# Patient Record
Sex: Female | Born: 1945 | Race: White | Hispanic: No | Marital: Married | Smoking: Former smoker
Health system: Southern US, Community
[De-identification: ages and names within clinical notes are randomized; demographics above are authoritative.]

## PROBLEM LIST (undated history)

## (undated) DIAGNOSIS — I878 Other specified disorders of veins: Secondary | ICD-10-CM

## (undated) DIAGNOSIS — R079 Chest pain, unspecified: Secondary | ICD-10-CM

## (undated) DIAGNOSIS — F17200 Nicotine dependence, unspecified, uncomplicated: Secondary | ICD-10-CM

## (undated) DIAGNOSIS — E119 Type 2 diabetes mellitus without complications: Secondary | ICD-10-CM

## (undated) DIAGNOSIS — I10 Essential (primary) hypertension: Secondary | ICD-10-CM

## (undated) DIAGNOSIS — IMO0002 Reserved for concepts with insufficient information to code with codable children: Secondary | ICD-10-CM

## (undated) DIAGNOSIS — I4892 Unspecified atrial flutter: Secondary | ICD-10-CM

## (undated) DIAGNOSIS — K219 Gastro-esophageal reflux disease without esophagitis: Secondary | ICD-10-CM

## (undated) DIAGNOSIS — G4733 Obstructive sleep apnea (adult) (pediatric): Secondary | ICD-10-CM

## (undated) DIAGNOSIS — I4891 Unspecified atrial fibrillation: Secondary | ICD-10-CM

## (undated) DIAGNOSIS — E785 Hyperlipidemia, unspecified: Secondary | ICD-10-CM

## (undated) DIAGNOSIS — I313 Pericardial effusion (noninflammatory): Secondary | ICD-10-CM

## (undated) DIAGNOSIS — E039 Hypothyroidism, unspecified: Secondary | ICD-10-CM

## (undated) DIAGNOSIS — I5030 Unspecified diastolic (congestive) heart failure: Secondary | ICD-10-CM

## (undated) DIAGNOSIS — I3139 Other pericardial effusion (noninflammatory): Secondary | ICD-10-CM

## (undated) DIAGNOSIS — E669 Obesity, unspecified: Secondary | ICD-10-CM

## (undated) DIAGNOSIS — J189 Pneumonia, unspecified organism: Secondary | ICD-10-CM

## (undated) DIAGNOSIS — D649 Anemia, unspecified: Secondary | ICD-10-CM

## (undated) HISTORY — PX: APPENDECTOMY: SHX54

## (undated) HISTORY — DX: Nicotine dependence, unspecified, uncomplicated: F17.200

## (undated) HISTORY — DX: Hyperlipidemia, unspecified: E78.5

## (undated) HISTORY — DX: Obstructive sleep apnea (adult) (pediatric): G47.33

## (undated) HISTORY — DX: Other specified disorders of veins: I87.8

## (undated) HISTORY — DX: Type 2 diabetes mellitus without complications: E11.9

## (undated) HISTORY — DX: Essential (primary) hypertension: I10

## (undated) HISTORY — DX: Obesity, unspecified: E66.9

---

## 1979-02-24 HISTORY — PX: BREAST BIOPSY: SHX20

## 1979-02-24 HISTORY — PX: CHOLECYSTECTOMY: SHX55

## 2000-11-05 ENCOUNTER — Ambulatory Visit (HOSPITAL_COMMUNITY): Admission: RE | Admit: 2000-11-05 | Discharge: 2000-11-05 | Payer: Self-pay | Admitting: Family Medicine

## 2000-11-05 ENCOUNTER — Encounter: Payer: Self-pay | Admitting: Family Medicine

## 2000-11-11 ENCOUNTER — Encounter: Admission: RE | Admit: 2000-11-11 | Discharge: 2000-11-11 | Payer: Self-pay | Admitting: Family Medicine

## 2000-11-11 ENCOUNTER — Encounter: Payer: Self-pay | Admitting: Family Medicine

## 2002-03-17 ENCOUNTER — Encounter: Admission: RE | Admit: 2002-03-17 | Discharge: 2002-03-17 | Payer: Self-pay | Admitting: Family Medicine

## 2002-03-17 ENCOUNTER — Encounter: Payer: Self-pay | Admitting: Family Medicine

## 2003-04-13 ENCOUNTER — Encounter: Payer: Self-pay | Admitting: Family Medicine

## 2003-04-13 ENCOUNTER — Ambulatory Visit (HOSPITAL_COMMUNITY): Admission: RE | Admit: 2003-04-13 | Discharge: 2003-04-13 | Payer: Self-pay | Admitting: Family Medicine

## 2004-06-28 ENCOUNTER — Emergency Department (HOSPITAL_COMMUNITY): Admission: EM | Admit: 2004-06-28 | Discharge: 2004-06-28 | Payer: Self-pay | Admitting: Emergency Medicine

## 2005-04-05 ENCOUNTER — Encounter (INDEPENDENT_AMBULATORY_CARE_PROVIDER_SITE_OTHER): Payer: Self-pay | Admitting: Specialist

## 2005-04-05 ENCOUNTER — Inpatient Hospital Stay (HOSPITAL_COMMUNITY): Admission: EM | Admit: 2005-04-05 | Discharge: 2005-04-06 | Payer: Self-pay | Admitting: Emergency Medicine

## 2005-06-07 ENCOUNTER — Other Ambulatory Visit: Admission: RE | Admit: 2005-06-07 | Discharge: 2005-06-07 | Payer: Self-pay | Admitting: Family Medicine

## 2005-07-02 ENCOUNTER — Encounter: Admission: RE | Admit: 2005-07-02 | Discharge: 2005-07-02 | Payer: Self-pay | Admitting: Family Medicine

## 2005-07-10 ENCOUNTER — Encounter (INDEPENDENT_AMBULATORY_CARE_PROVIDER_SITE_OTHER): Payer: Self-pay | Admitting: Cardiology

## 2005-07-10 ENCOUNTER — Ambulatory Visit (HOSPITAL_COMMUNITY): Admission: RE | Admit: 2005-07-10 | Discharge: 2005-07-10 | Payer: Self-pay | Admitting: Family Medicine

## 2005-07-11 ENCOUNTER — Encounter: Admission: RE | Admit: 2005-07-11 | Discharge: 2005-07-11 | Payer: Self-pay | Admitting: Family Medicine

## 2005-08-03 ENCOUNTER — Ambulatory Visit (HOSPITAL_COMMUNITY): Admission: RE | Admit: 2005-08-03 | Discharge: 2005-08-03 | Payer: Self-pay | Admitting: Gastroenterology

## 2005-08-03 ENCOUNTER — Encounter (INDEPENDENT_AMBULATORY_CARE_PROVIDER_SITE_OTHER): Payer: Self-pay | Admitting: Specialist

## 2005-08-03 LAB — HM COLONOSCOPY

## 2006-03-25 ENCOUNTER — Ambulatory Visit: Payer: Self-pay | Admitting: Family Medicine

## 2006-03-25 ENCOUNTER — Encounter: Admission: RE | Admit: 2006-03-25 | Discharge: 2006-03-25 | Payer: Self-pay | Admitting: Family Medicine

## 2007-03-03 ENCOUNTER — Ambulatory Visit: Payer: Self-pay | Admitting: Family Medicine

## 2008-01-19 ENCOUNTER — Ambulatory Visit: Payer: Self-pay | Admitting: Family Medicine

## 2008-01-26 ENCOUNTER — Ambulatory Visit: Payer: Self-pay | Admitting: Family Medicine

## 2008-01-26 ENCOUNTER — Encounter: Admission: RE | Admit: 2008-01-26 | Discharge: 2008-01-26 | Payer: Self-pay | Admitting: Family Medicine

## 2008-04-30 ENCOUNTER — Ambulatory Visit: Payer: Self-pay | Admitting: Family Medicine

## 2008-05-06 ENCOUNTER — Ambulatory Visit: Payer: Self-pay | Admitting: Family Medicine

## 2008-05-07 ENCOUNTER — Ambulatory Visit: Payer: Self-pay | Admitting: Vascular Surgery

## 2008-05-07 ENCOUNTER — Ambulatory Visit: Admission: RE | Admit: 2008-05-07 | Discharge: 2008-05-07 | Payer: Self-pay | Admitting: Family Medicine

## 2008-05-07 ENCOUNTER — Encounter: Payer: Self-pay | Admitting: Family Medicine

## 2008-05-13 ENCOUNTER — Ambulatory Visit: Payer: Self-pay | Admitting: Family Medicine

## 2008-05-24 ENCOUNTER — Ambulatory Visit (HOSPITAL_COMMUNITY): Admission: RE | Admit: 2008-05-24 | Discharge: 2008-05-24 | Payer: Self-pay | Admitting: Family Medicine

## 2008-05-26 ENCOUNTER — Ambulatory Visit: Payer: Self-pay | Admitting: Family Medicine

## 2009-01-27 ENCOUNTER — Ambulatory Visit: Payer: Self-pay | Admitting: Family Medicine

## 2009-01-31 ENCOUNTER — Encounter: Admission: RE | Admit: 2009-01-31 | Discharge: 2009-01-31 | Payer: Self-pay | Admitting: Family Medicine

## 2009-04-04 ENCOUNTER — Ambulatory Visit: Payer: Self-pay | Admitting: Family Medicine

## 2010-02-15 ENCOUNTER — Ambulatory Visit: Payer: Self-pay | Admitting: Physician Assistant

## 2010-05-31 ENCOUNTER — Ambulatory Visit: Payer: Self-pay | Admitting: Family Medicine

## 2010-07-16 ENCOUNTER — Encounter: Payer: Self-pay | Admitting: Family Medicine

## 2010-10-25 ENCOUNTER — Encounter: Payer: Self-pay | Admitting: Family Medicine

## 2010-11-10 NOTE — Op Note (Signed)
NAME:  Nancy West, LASHLEY              ACCOUNT NO.:  [id number]   MEDICAL RECORD NO.:  [id number]          PATIENT TYPE:  AMB   LOCATION:  ENDO                         FACILITY:  [facility]   PHYSICIAN:  Anselmo Rod, M.D.  DATE OF BIRTH:  Nov 13, 1945   DATE OF PROCEDURE:  08/03/2005  DATE OF DISCHARGE:                                 OPERATIVE REPORT   PROCEDURE PERFORMED:  Colonoscopy with cold biopsies x 8 and snare  polypectomy x 1.   ENDOSCOPIST:  Charna Elizabeth, M.D.   INSTRUMENT USED:  Olympus video colonoscope.   INDICATIONS FOR PROCEDURE:  The patient is a 65 year old white female  undergoing screening colonoscopy.  The patient has a history of guaiac  positive stool and had noted blood occasionally, which she attributes to her  hemorrhoids.   PREPROCEDURE PREPARATION:  Informed consent was procured from the patient.  The patient was fasted for four hours prior to the procedure and prepped  OsmoPrep pills the night of and the morning of the procedure.  The risks and  benefits of the procedure including a 10% miss rate for polyps or cancers  was discussed with the patient as well.   PREPROCEDURE PHYSICAL:  The patient had stable vital signs.  Neck supple.  Chest clear to auscultation.  S1 and S2 regular.  Abdomen soft with normal  bowel sounds.   DESCRIPTION OF PROCEDURE:  The patient was placed in left lateral decubitus  position and sedated with 90 mcg of fentanyl and 7.5 mg of Versed in slow  incremental doses.  Once the patient was adequately sedated and maintained  on low flow oxygen and continuous cardiac monitoring, the Olympus video  colonoscope was advanced from the rectum to the cecum.  The appendicular  orifice and ileocecal valve were clearly visualized and photographed.  The  terminal ileum appeared normal and without lesions.  Small external  hemorrhoids and small internal hemorrhoids were seen on retroflexion.  A  small sessile polyp was removed by cold snare  from 6-0 cm.  Several left  colonic polyps were removed by cold biopsy as well.  They were all placed in  the same bottle.  There was no evidence of diverticulosis.  The transverse  colon, right colon, cecum and terminal ileum appeared normal.  The patient  tolerated the procedure well without complication.   IMPRESSION:  1.  Small external hemorrhoid.  2.  Small internal hemorrhoids.  3.  Multiple small sessile polyps removed by cold biopsies and one by cold      snare from the left colon.  4.  No evidence of diverticulosis.  5.  Normal-appearing transverse colon, right colon, cecum and terminal      ileum.   RECOMMENDATIONS:  1.  Await pathology results.  2.  Avoid all nonsteroidals including aspirin for the next two weeks.  3.  Repeat colonoscopy depending on pathology results.  4.  Outpatient followup as need arises in the future.      Anselmo Rod, M.D.  Electronically Signed     JNM/MEDQ  D:  08/03/2005  T:  08/03/2005  Job:  [id number]   cc:   Sharlot Gowda, M.D.  Fax: [phone]

## 2010-11-10 NOTE — Discharge Summary (Signed)
NAME:  Nancy West, Nancy West NO.:  [id number]   MEDICAL RECORD NO.:  [id number]          PATIENT TYPE:  INP   LOCATION:  5740                         FACILITY:  [facility]   PHYSICIAN:  Lebron Conners, M.D.   DATE OF BIRTH:  April 18, 1946   DATE OF ADMISSION:  04/05/2005  DATE OF DISCHARGE:  04/06/2005                                 DISCHARGE SUMMARY   DISCHARGE DIAGNOSES:  1.  Acute appendicitis, status post laparoscopic appendectomy on April 05, 2005.  2.  Hypertension, treated.  3.  Hyperlipidemia, treated.  4.  Tobacco abuse.  5.  History of open cholecystectomy in the 1980's.   HOSPITAL COURSE:  [name] is a 65 year old female who presented to the  emergency room with approximately 48 hours of abdominal pain. Initially, the  abdominal pain presented in the umbilicus and gradually migrated to the  right lower quadrant. She presented to the emergency room and CT of the  abdomen and pelvis showed an inflamed tubular structure extending from the  pelvis and felt to be representative of an inflamed appendix. The patient  then was taken to the operating room and underwent a laparoscopic  appendectomy under the care of Dr. Lebron Conners. The patient tolerated the  procedure well and was taken back to her room in stable condition.   Overnight, the patient remained stable and the following day, she was felt  to be ready for discharge to home.   CONDITION ON DISCHARGE:  She is discharged to home in stable condition.   DISCHARGE MEDICATIONS:  1.  Vicodin as needed for pain.  2.  She is to resume her Lotensin.  3.  She is to resume her Lipitor daily.   WOUND CARE:  She is to clean her incisions gently with soap and water.   ACTIVITY:  No driving for 2 days. No lifting over 10 pounds for 3 weeks.   DIET:  Advance as tolerated.   SPECIAL INSTRUCTIONS:  She is to call for fever greater than 101.5 or any  redness or drainage of her incision.      Guy Franco,  P.A.      Lebron Conners, M.D.  Electronically Signed    LB/MEDQ  D:  05/25/2005  T:  05/26/2005  Job:  [id number]   cc:   Lebron Conners, M.D.  [address], [address]  [place]  [place] [address]

## 2010-11-10 NOTE — Op Note (Signed)
NAME:  Nancy West, Nancy West NO.:  [id number]   MEDICAL RECORD NO.:  [id number]          PATIENT TYPE:  INP   LOCATION:  1828                         FACILITY:  [facility]   PHYSICIAN:  Lorre Munroe., M.D.DATE OF BIRTH:  09-17-1945   DATE OF PROCEDURE:  04/05/2005  DATE OF DISCHARGE:                                 OPERATIVE REPORT   PREOPERATIVE DIAGNOSIS:  Acute appendicitis.   POSTOPERATIVE DIAGNOSIS:  Acute appendicitis.   OPERATION:  Laparoscopic appendectomy.   SURGEON:  Lebron Conners, M.D.   ASSISTANT:  Raechel Ache, M.D.   ANESTHESIA:  General and local.   PROCEDURE:  After the patient was monitored and given general anesthesia and  had routine preparation and draping of the abdomen, I infiltrated local  anesthetic just above the umbilicus and made a 3 cm vertical incision,  dissected down through the fat, then made a 2 cm incision in the midline  fascia and bluntly entered the peritoneal cavity.  After assuring that there  were no adhesions in the region, I placed a 0 Vicryl pursestring suture in  the fascia and secured a Hasson cannula and inflated the abdomen with CO2.  When I put in the laparoscope and placed the patient head-down tilted a  little bit to the left, I could see an acutely inflamed appendix.  I saw no  other abnormalities.  I anesthetized a spot in the right upper quadrant and  another spot in the lower midline and under direct view put in a 5 mm right  upper quadrant port and an 11 mm lower midline port.  I then broke up  adhesions, holding the appendix down, and was able to grasp it with a large  grasper.  I then dissected the mesentery to reduce the bulk of it a good  deal and when I had a nice view of the appendiceal artery and was thinned  down pretty well, I stapled across the mesentery with a vascular stapler and  that divided it nicely.  I was able to dissect up the base of the appendix  and see that near the cecum the appendix  appeared to have good integrity.  I  stapled across it with a firing of the stapler and almost completely divided  it, but I felt that I should fire one more time so I fired one more load  across the cecum and base of the appendix, and the amputation was good.  Hemostasis was not a problem.  I briefly irrigated the operative area and  removed the irrigant.  I placed the appendix into a plastic pouch and  removed it through the umbilical incision and then after assuring good  hemostasis, I removed the right upper quadrant port under direct view and  then after allowing the CO2 to escape removed the lower midline port.  The  patient was stable throughout the procedure and seemed to tolerate it well.  In case I failed to mention it, the appendix was removed through the  umbilical incision and thereafter the pursestring suture was tied.  All the  skin incisions were closed with  intracuticular 4-0 Vicryl and Steri-Strips.     Lorre Munroe., M.D.  Electronically Signed    WB/MEDQ  D:  04/05/2005  T:  04/05/2005  Job:  [id number]

## 2010-11-17 ENCOUNTER — Encounter: Payer: Self-pay | Admitting: Family Medicine

## 2010-11-17 ENCOUNTER — Ambulatory Visit (INDEPENDENT_AMBULATORY_CARE_PROVIDER_SITE_OTHER): Payer: PRIVATE HEALTH INSURANCE | Admitting: Family Medicine

## 2010-11-17 VITALS — BP 134/68 | HR 68 | Ht 68.0 in | Wt 330.0 lb

## 2010-11-17 DIAGNOSIS — E039 Hypothyroidism, unspecified: Secondary | ICD-10-CM

## 2010-11-17 DIAGNOSIS — I10 Essential (primary) hypertension: Secondary | ICD-10-CM | POA: Insufficient documentation

## 2010-11-17 DIAGNOSIS — E119 Type 2 diabetes mellitus without complications: Secondary | ICD-10-CM

## 2010-11-17 DIAGNOSIS — E559 Vitamin D deficiency, unspecified: Secondary | ICD-10-CM | POA: Insufficient documentation

## 2010-11-17 DIAGNOSIS — R609 Edema, unspecified: Secondary | ICD-10-CM

## 2010-11-17 DIAGNOSIS — E7800 Pure hypercholesterolemia, unspecified: Secondary | ICD-10-CM | POA: Insufficient documentation

## 2010-11-17 DIAGNOSIS — F17200 Nicotine dependence, unspecified, uncomplicated: Secondary | ICD-10-CM

## 2010-11-17 LAB — HEMOGLOBIN A1C
Hgb A1c MFr Bld: 6.4 % — ABNORMAL HIGH (ref <5.7)
Mean Plasma Glucose: 137 mg/dL — ABNORMAL HIGH (ref <117)

## 2010-11-17 LAB — COMPREHENSIVE METABOLIC PANEL
Albumin: 4.1 g/dL (ref 3.5–5.2)
Alkaline Phosphatase: 135 U/L — ABNORMAL HIGH (ref 39–117)
BUN: 27 mg/dL — ABNORMAL HIGH (ref 6–23)
CO2: 29 mEq/L (ref 19–32)
Calcium: 9.5 mg/dL (ref 8.4–10.5)
Glucose, Bld: 108 mg/dL — ABNORMAL HIGH (ref 70–99)
Potassium: 4.2 mEq/L (ref 3.5–5.3)

## 2010-11-17 LAB — LIPID PANEL
Cholesterol: 130 mg/dL (ref 0–200)
Total CHOL/HDL Ratio: 2.8 Ratio

## 2010-11-17 LAB — TSH: TSH: 5.014 u[IU]/mL — ABNORMAL HIGH (ref 0.350–4.500)

## 2010-11-17 MED ORDER — ATORVASTATIN CALCIUM 40 MG PO TABS: 40.0000 mg | ORAL_TABLET | Freq: Every day | ORAL | Status: DC

## 2010-11-17 MED ORDER — FUROSEMIDE 40 MG PO TABS: 40.0000 mg | ORAL_TABLET | Freq: Two times a day (BID) | ORAL | Status: DC

## 2010-11-17 MED ORDER — BENAZEPRIL-HYDROCHLOROTHIAZIDE 20-25 MG PO TABS: 1.0000 | ORAL_TABLET | Freq: Every day | ORAL | Status: DC

## 2010-11-17 MED ORDER — LEVOTHYROXINE SODIUM 50 MCG PO TABS: 50.0000 ug | ORAL_TABLET | Freq: Every day | ORAL | Status: DC

## 2010-11-17 NOTE — Patient Instructions (Signed)
Limit alcohol to one drink a day (or less!) Try again to quit smoking--let me know if you need Chantix again Try and exercise daily Cut back on sodium in your diet Calcium 1500 mg daily (throughout the day, between diet and vitamins) and 1000-2000 IU of Vitamin D daily (unless you hear otherwise from [place] after lab tests are back)   2 Gram Low Sodium Diet A 2 gram sodium diet restricts the amount of sodium in the diet to no more than 2 grams (g) or 2000 milligrams (mg) daily. Limiting the amount of sodium is often used to help lower blood pressure. It is important if you have heart, liver, or kidney problems. Many foods contain sodium for flavor and sometimes as a preservative. When the amount of sodium in a diet needs to be low, it is important to know what to look for when choosing foods and drinks. The following includes some information and guidelines to help make it easier for you to adapt to a low sodium diet. QUICK TIPS  Do not add salt to food.   Avoid convenience items and fast food.   Choose unsalted snack foods.   Buy lower sodium products, often labeled as "lower sodium" or "no salt added."   Check food labels to learn how much sodium is in 1 serving.   When eating at a restaurant, ask that your food be prepared with less salt or none, if possible.  READING FOOD LABELS FOR SODIUM INFORMATION The nutrition facts label is a good place to find how much sodium is in foods. Look for products with no more than 500 to 600 mg of sodium per meal and no more than 150 mg per serving. Remember that 2 g = 2000 mg. The food label may also list foods as:  Sodium-free: Less than 5 mg in a serving.   Very low sodium: 35 mg or less in a serving.   Low-sodium: 140 mg or less in a serving.   Light in sodium: 50% less sodium in a serving. For example, if a food that usually has 300 mg of sodium is changed to become light in sodium, it will have 150 mg of sodium.   Reduced sodium: 25% less  sodium in a serving. For example, if a food that usually has 400 mg of sodium is changed to reduced sodium, it will have 300 mg of sodium.  CHOOSING FOODS AVOID CHOOSE  Grains: Salted crackers and snack items. Some cereals, including instant hot cereals. Bread stuffing and biscuit mixes. Seasoned rice or pasta mixes. Grains: Unsalted snack items. Low-sodium cereals, oats, puffed wheat and rice, shredded wheat. English muffins and bread. Pasta.  Meats: Salted, canned, smoked, spiced, or pickled meats, including fish and poultry. Bacon, ham, sausage, cold cuts, hot dogs, anchovies. Meats: Low-sodium canned tuna and salmon. Fresh or frozen meat, poultry, and fish.  Dairy: Processed cheese and spreads. Cottage cheese. Buttermilk and condensed milk. Regular cheese. Dairy: Milk. Low-sodium cottage cheese. Yogurt. Sour cream. Low-sodium cheese.  Fruits and Vegetables: Regular canned vegetables. Regular canned tomato sauce and paste. Frozen vegetables in sauces. Olives. Rosita Fire. Relishes. Sauerkraut. Fruits and Vegetables: Low-sodium canned vegetables. Low-sodium tomato sauce and paste. Fresh and frozen vegetables. Fresh and frozen fruit.  Condiments: Canned and packaged gravies. Worcestershire sauce. Tartar sauce. Barbecue sauce. Soy sauce. Steak sauce. Ketchup. Onion, garlic, and table salt. Meat flavorings and tenderizers. Condiments: Fresh and dried herbs and spices. Low-sodium varieties of mustard and ketchup. Lemon juice. Tabasco sauce. Horseradish.  SAMPLE 2 GRAM SODIUM MEAL PLAN Meal Foods Sodium (mg)  Breakfast 1 cup low-fat milk 2 slices whole-wheat toast 1 tablespoon heart-healthy margarine 1 hard-boiled egg 1 small orange [phone]  Lunch 1 cup raw carrots  cup hummus 1 cup low-fat milk  cup red grapes 1 whole-wheat pita bread 76 [phone]  Dinner 1 cup whole-wheat pasta 1 cup low-sodium tomato sauce 3 ounces lean ground  beef 1 small side salad (1 cup raw spinach leaves,  cup cucumber,  cup yellow bell pepper) with 1 teaspoon olive oil and 1 teaspoon red wine vinegar 2 73 57 25  Snack 1 container low-fat vanilla yogurt 3 graham cracker squares 107 127  Nutrient Analysis Calories: 2033 Protein: 77 g Carbohydrate: 282 g Fat: 72 g Sodium: 1971 mg Document Released: 06/11/2005 Document Re-Released: 11/29/2009 [facility] Patient Information 2011 [place], [place].

## 2010-11-17 NOTE — Progress Notes (Signed)
Subjective:    Patient ID: Nancy West, female    DOB: [birth year], 65 y.o.   MRN: [id number]  HPI Hypertension follow-up:  Blood pressures are not checked elsewhere.  Denies dizziness, headaches, chest pain.  Denies side effects of medications. Eats high sodium foods (salami) although doesn't add salt to foods  Diabetes follow-up:  Blood sugars are not checked at home.  Denies polydipsia and polyuria.  Last eye exam was 1.5 years ago.  Patient follows a low sugar diet and checks feet regularly without concerns.  Not getting any regular exercise.  Has noticed some tingling of small areas of both of her feet over the last 6 months.  Hypothyroidism:  Takes medication regularly.  Denies any fatigue or significant thyroid symptoms.  Has gained 4 pounds since last visit  Vitamin D deficiency.  Was on Rx replacement and re-check was normal, but hasn't been using any OTC Vitamin D since last check (low normal in December)  Hyperlipidemia follow-up:  Patient is reportedly following a low-fat, low cholesterol diet.  Compliant with medications and denies medication side effects  Past Medical History  Diagnosis Date  . Obesity   . Hypertension   . Hyperlipidemia   . Diabetes mellitus   . Unspecified hypothyroidism   . Venous stasis of lower extremity     Past Surgical History  Procedure Date  . Cholecystectomy 1980  . Appendectomy     History   Social History  . Marital Status: Married    Spouse Name: N/A    Number of Children: 0  . Years of Education: N/A   Occupational History  . Retired (Presenter, broadcasting) [employer]   Social History Main Topics  . Smoking status: Current Everyday Smoker -- 1.0 packs/day    Types: Cigarettes  . Smokeless tobacco: Never Used   Comment: 1 ppd since age 66; quit x 7 months with Chantix  . Alcohol Use: Yes     daily, 2-3 glasses of wine every evening  . Drug Use: No  . Sexually Active: Not Currently   Other Topics Concern  . 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 Cancer Mother     breast cancer (70), colon cancer  . Diabetes Mother   . Hypertension Mother   . Diabetes Father     Current outpatient prescriptions:aspirin 81 MG tablet, Take 81 mg by mouth daily.  , Disp: , Rfl: ;  atorvastatin (LIPITOR) 40 MG tablet, Take 1 tablet (40 mg total) by mouth daily., Disp: 90 tablet, Rfl: 1;  benazepril-hydrochlorthiazide (LOTENSIN HCT) 20-25 MG per tablet, Take 1 tablet by mouth daily., Disp: 90 tablet, Rfl: 1;  furosemide (LASIX) 40 MG tablet, Take 1 tablet (40 mg total) by mouth 2 (two) times daily., Disp: 90 tablet, Rfl: 1 ibuprofen (ADVIL,MOTRIN) 200 MG tablet, Take 200 mg by mouth every 6 (six) hours as needed.  , Disp: , Rfl: ;  levothyroxine (SYNTHROID, LEVOTHROID) 50 MCG tablet, Take 1 tablet (50 mcg total) by mouth daily., Disp: 90 tablet, Rfl: 1;  DISCONTD: atorvastatin (LIPITOR) 40 MG tablet, Take 40 mg by mouth daily.  , Disp: , Rfl: ;  DISCONTD: benazepril-hydrochlorthiazide (LOTENSIN HCT) 20-25 MG per tablet, Take 1 tablet by mouth daily.  , Disp: , Rfl:  DISCONTD: furosemide (LASIX) 40 MG tablet, Take 40 mg by mouth 2 (two) times daily.  ,  Disp: , Rfl: ;  DISCONTD: levothyroxine (SYNTHROID, LEVOTHROID) 50 MCG tablet, Take 50 mcg by mouth daily.  , Disp: , Rfl:   Allergies  Allergen Reactions  . Augmentin (Amoxicillin-Pot Clavulanate)     Abdominal pain    Review of Systems See HPI. Chronic back pain, worse with walking.  Improved since losing weight.  Knee pain/arthritis--uses ibuprofen about 3x/week.  No headaches, chest pain, palpitations.  Mild swelling in legs, "under control" with the Lasix    Objective:   Physical Exam BP 134/68  Pulse 68  Ht 5\' 8"  (1.727 m)  Wt 330 lb (149.687 kg)  BMI 50.18 kg/m2 HEENT:  PERRL, EOMI, conjunctiva clear.  TM's normal, OP clear Neck:  No lymphadenopathy, thyromegaly  or carotid bruit Heart:  Regular rate and rhythm, no murmurs Lungs:  Clear bilaterally Abdomen:  Obese, nontender, no organomegaly or masses Extremities: Venous stasis changes--dark discoloration of both feet; Diminished pulses bilaterally. 1+ edema at ankles Normal monofilament testing Small callous R great toe Psych:  Normal mood, affect, hygiene and grooming      Assessment & Plan:   1. Type II or unspecified type diabetes mellitus without mention of complication, not stated as uncontrolled  Hemoglobin A1c, Urine Microalbumin w/creat. ratio   Diet controlled, has never been on medications  2. Essential hypertension, benign  Comprehensive metabolic panel, benazepril-hydrochlorthiazide (LOTENSIN HCT) 20-25 MG per tablet   suboptimally controlled.  Goal <130/80  3. Pure hypercholesterolemia  Lipid panel, Comprehensive metabolic panel, atorvastatin (LIPITOR) 40 MG tablet  4. Unspecified hypothyroidism  TSH, levothyroxine (SYNTHROID, LEVOTHROID) 50 MCG tablet  5. Unspecified vitamin D deficiency  Vitamin D 25 hydroxy   was corrected with Rx Vitamin D, but not taking any OTC supplementation.  Re-check today  6. Obesity, Class III, BMI 40-49.9 (morbid obesity)     Max weight of 400 pounds in 2009  7. Edema  furosemide (LASIX) 40 MG tablet, Ambulatory referral to Vascular Surgery  8. Tobacco use disorder     Patient with diminished pulses and is at high risk for PAD.  Refer for ABI and arterial u/s of LE's if ABI abnormal (and then consult vascular surgeons if abnormal).  Counseled for at least 20 minutes on proper diet, exercise and importance of quitting smoking.  The fact that she was successful in the past means that if she keeps trying to quit, she can be successful in the future.  She is to let me know when she is ready for additional assistance (ie. Chantix). Instructed to cut back on alcohol to a maximum of one drink daily

## 2010-11-18 LAB — VITAMIN D 25 HYDROXY (VIT D DEFICIENCY, FRACTURES): Vit D, 25-Hydroxy: 33 ng/mL (ref 30–89)

## 2010-11-22 ENCOUNTER — Telehealth: Payer: Self-pay | Admitting: *Deleted

## 2010-11-22 NOTE — Telephone Encounter (Signed)
Pt informed of all labs. She has an appt sch in 3 months already, 02/22/11 @ 8:45, she would like to have TSH checked at that appt, not prior and we can call her after labs are back if there is a thyroid med adjustment. She will also call if her BP readings go over 130/80 consistently for possible med change.

## 2010-11-27 ENCOUNTER — Encounter (INDEPENDENT_AMBULATORY_CARE_PROVIDER_SITE_OTHER): Payer: PRIVATE HEALTH INSURANCE

## 2010-11-27 DIAGNOSIS — L97909 Non-pressure chronic ulcer of unspecified part of unspecified lower leg with unspecified severity: Secondary | ICD-10-CM

## 2011-01-11 ENCOUNTER — Encounter: Payer: Self-pay | Admitting: Family Medicine

## 2011-01-23 ENCOUNTER — Encounter: Payer: Self-pay | Admitting: Family Medicine

## 2011-01-24 ENCOUNTER — Ambulatory Visit (INDEPENDENT_AMBULATORY_CARE_PROVIDER_SITE_OTHER): Payer: PRIVATE HEALTH INSURANCE | Admitting: Family Medicine

## 2011-01-24 ENCOUNTER — Encounter: Payer: Self-pay | Admitting: Family Medicine

## 2011-01-24 VITALS — BP 132/80 | HR 68 | Ht 68.0 in | Wt 325.0 lb

## 2011-01-24 DIAGNOSIS — I10 Essential (primary) hypertension: Secondary | ICD-10-CM

## 2011-01-24 DIAGNOSIS — K219 Gastro-esophageal reflux disease without esophagitis: Secondary | ICD-10-CM

## 2011-01-24 DIAGNOSIS — R9431 Abnormal electrocardiogram [ECG] [EKG]: Secondary | ICD-10-CM

## 2011-01-24 DIAGNOSIS — F17200 Nicotine dependence, unspecified, uncomplicated: Secondary | ICD-10-CM

## 2011-01-24 DIAGNOSIS — R0789 Other chest pain: Secondary | ICD-10-CM

## 2011-01-24 DIAGNOSIS — R072 Precordial pain: Secondary | ICD-10-CM

## 2011-01-24 MED ORDER — DEXLANSOPRAZOLE 60 MG PO CPDR: 60.0000 mg | DELAYED_RELEASE_CAPSULE | Freq: Every day | ORAL | Status: AC

## 2011-01-24 NOTE — Progress Notes (Signed)
Patient presents with concern that she might have pancreatic cancer or about to have a heart attack.  She is complaining of epigastric pain.  She describes a tightness in her abdomen (above umbilicus).  Initially pain seemed to be higher, in the chest/under breasts.   Pain is now lower, into the upper abdomen.  Increased frequency of pain, until it has been more constant over the last 4 months.  Associated with nausea (occasionally), dry heaves (3x), belching constantly.  Initially discomfort was related to meals, especially after fatty or spicy foods, large meals. May have had symptoms for about a year. More recently, discomfort has been constant.  However, since she made appt, symptoms are improved.  Denies any discomfort today.  Since her last visit, she cut out almost all alcohol.  Cut back on smoking--now down to 5 cigarettes/day.  Denies cough (other than slight smoker's cough) no hoarseness, no exertional chest pain.  Denies exertional chest pain.  Some SOB with exertion.  Previously saw Dr. Reyes Ivan for cardiac evaluation (2-3 years ago). Last visit was 2010, and was doing well. She had an echocardiogram which showed mild diastolic dysfunction, otherwise normal (in 2009).  Past Medical History  Diagnosis Date  . Obesity   . Hypertension   . Hyperlipidemia   . Diabetes mellitus   . Unspecified hypothyroidism   . Venous stasis of lower extremity     Past Surgical History  Procedure Date  . Cholecystectomy 1980  . Appendectomy     History   Social History  . Marital Status: Married    Spouse Name: N/A    Number of Children: 0  . Years of Education: N/A   Occupational History  . Retired (Presenter, broadcasting) [employer]   Social History Main Topics  . Smoking status: Current Everyday Smoker -- 1.0 packs/day    Types: Cigarettes  . Smokeless tobacco: Never Used   Comment: 1 ppd since age 57; quit x 7 months with Chantix  . Alcohol Use: Yes     daily, 2-3 glasses of wine every  evening  . Drug Use: No  . Sexually Active: Not Currently   Other Topics Concern  . 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 Cancer Mother     breast cancer (70), colon cancer  . Diabetes Mother   . Hypertension Mother   . Diabetes Father    Current Outpatient Prescriptions on File Prior to Visit  Medication Sig Dispense Refill  . aspirin 81 MG tablet Take 81 mg by mouth daily.        Marland Kitchen atorvastatin (LIPITOR) 40 MG tablet Take 1 tablet (40 mg total) by mouth daily.  90 tablet  1  . benazepril-hydrochlorthiazide (LOTENSIN HCT) 20-25 MG per tablet Take 1 tablet by mouth daily.  90 tablet  1  . ibuprofen (ADVIL,MOTRIN) 200 MG tablet Take 200 mg by mouth every 6 (six) hours as needed.        Marland Kitchen levothyroxine (SYNTHROID, LEVOTHROID) 50 MCG tablet Take 1 tablet (50 mcg total) by mouth daily.  90 tablet  1  . DISCONTD: furosemide (LASIX) 40 MG tablet Take 1 tablet (40 mg total) by mouth 2 (two) times daily.  90 tablet  1    Allergies  Allergen Reactions  . Augmentin (Amoxicillin-Pot Clavulanate)     Abdominal pain   ROS:  See HPI.  Denies fevers, URI symptoms.  +GI complaints.  No change in LE swelling; denies LE pain.  Reports being told that ABI was normal by tech who performed study at vascular office. Denies skin concerns  PHYSICAL EXAM: Well developed, obese, pleasant female, mildly anxious, in no distress BP 132/80  Pulse 68  Ht 5\' 8"  (1.727 m)  Wt 325 lb (147.419 kg)  BMI 49.42 kg/m2 Neck: no lymphadenopathy or thyromegaly Heart: regular rate and rhythm, no significant murmur Chest wall nontender Abdomen: obese.  Soft, nontender.  No hepatosplenomegaly or mass.  No epigastric tenderness Extremities: significant venous stasis changes bilaterally.  Skin intact  EKG: NSR with PVC, rate 78.  Some LAE.  TW inversions noted V3-V5 which appear to be new compared to EKG from  2009  ASSESSMENT/PLAN: 1. GERD (gastroesophageal reflux disease)  dexlansoprazole (DEXILANT) 60 MG capsule  2. Chest pain, mid sternal  PR ELECTROCARDIOGRAM, COMPLETE, Ambulatory referral to Cardiology  3. Abnormal EKG    4. Essential hypertension, benign    5. Tobacco use disorder     EKG was performed due to pts concern of possible heart disease, some atypical symptoms, and significant risk factors for heart disease (smoking, DM, HTN, hyperlipidemia, obesity).  EKG shows evidence of possible ischemia, with changes compared to previous EKG in 2009--referring back to [facility] for further evaluation.  Some of her symptoms are likely related to reflux.  Reflux precautions reviewed extensively.  Encouraged to quit smoking.  Continue to avoid alcohol.  Avoid spicy, acidic foods.  Eat smaller meals (more frequently).  Wait at least 2-3 hours after eating before reclining.  Keep head of bed elevated.    Dexilant 60mg  #25 given, to take one daily.  F/u as previously scheduled for later this month

## 2011-01-24 NOTE — Patient Instructions (Signed)
Acid Reflux (GERD) Acid reflux is also called gastroesophageal reflux disease (GERD). Your stomach makes acid to help digest food. Acid reflux happens when acid from your stomach goes into the tube between your mouth and stomach (esophagus). Your stomach is protected from the acid, but this tube is not. When acid gets into the tube, it may cause a burning feeling in the chest (heartburn). Besides heartburn, other health problems can happen if the acid keeps going into the tube. Some causes of acid reflux include:  Being overweight.   Smoking.   Drinking alcohol.   Eating large meals.   Eating meals and then going to bed right away.   Eating certain foods.   Increased stomach acid production.  HOME CARE  Take all medicine as told by your doctor.   You may need to:   Lose weight.   Avoid alcohol.   Quit smoking.   Do not eat big meals. It is better to eat smaller meals throughout the day.    Diet for GERD or PUD Nutrition therapy can help ease the discomfort of gastroesophageal reflux disease (GERD) and peptic ulcer disease (PUD).  HOME CARE INSTRUCTIONS  Eat your meals slowly, in a relaxed setting.   Eat 5 to 6 small meals per day.   If a food causes distress, stop eating it for a period of time.  FOODS TO AVOID:  Coffee, regular or decaffeinated.  Cola beverages, regular or low calorie.   Tea, regular or decaffeinated.   Pepper.   Cocoa.   High fat foods including meats.   Butter, margarine, hydrogenated oil (trans fats).  Peppermint or spearmint (if you have GERD).   Fruits and vegetables as tolerated.   Alcoholic beverages.   Nicotine (smoking or chewing). This is one of the most potent stimulants to acid production in the gastrointestinal tract.   Any food that seems to aggravate your condition.   If you have questions regarding your diet, call your caregiver's office or a registered dietitian. OTHER TIPS IF YOU HAVE GERD:  Lying flat may make  symptoms worse. Keep the head of your bed raised 6 to 9 inches by using a foam wedge or blocks under the legs of the bed.   Do not lay down until 3 hours after eating a meal.   Daily physical activity may help reduce symptoms.  MAKE SURE YOU:   Understand these instructions.   Will watch your condition.   Will get help right away if you are not doing well or get worse.  Document Released: 06/11/2005 Document Re-Released: 10/28/2008 [facility] Patient Information 2011 [place], [place].  Do not eat a meal and then nap or go to bed.   Sleep with your head higher than your stomach.   Avoid foods that bother you.   You may need more tests, or you may need to see a special doctor.  GET HELP RIGHT AWAY IF:  You have chest pain that is different than before.   You have pain that goes to your arms, jaw, or between your shoulder blades.   You throw up (vomit) blood, dark brown liquid, or your throw up looks like coffee grounds.   You have trouble swallowing.   You have trouble breathing or cannot stop coughing.   You feel dizzy or pass out.   Your skin is cool, wet, and pale.   Your medicine is not helping.  MAKE SURE YOU:   Understand these instructions.   Will watch your condition.  Will get help right away if you are not doing well or get worse.

## 2011-02-13 ENCOUNTER — Other Ambulatory Visit: Payer: Self-pay | Admitting: Family Medicine

## 2011-02-14 ENCOUNTER — Encounter: Payer: Self-pay | Admitting: *Deleted

## 2011-02-21 ENCOUNTER — Ambulatory Visit (INDEPENDENT_AMBULATORY_CARE_PROVIDER_SITE_OTHER): Payer: PRIVATE HEALTH INSURANCE | Admitting: Cardiovascular Disease

## 2011-02-21 ENCOUNTER — Encounter: Payer: Self-pay | Admitting: Cardiovascular Disease

## 2011-02-21 DIAGNOSIS — R9431 Abnormal electrocardiogram [ECG] [EKG]: Secondary | ICD-10-CM

## 2011-02-21 DIAGNOSIS — R079 Chest pain, unspecified: Secondary | ICD-10-CM

## 2011-02-21 DIAGNOSIS — I509 Heart failure, unspecified: Secondary | ICD-10-CM

## 2011-02-21 DIAGNOSIS — I10 Essential (primary) hypertension: Secondary | ICD-10-CM

## 2011-02-21 DIAGNOSIS — R0602 Shortness of breath: Secondary | ICD-10-CM

## 2011-02-21 DIAGNOSIS — I5032 Chronic diastolic (congestive) heart failure: Secondary | ICD-10-CM | POA: Insufficient documentation

## 2011-02-21 NOTE — Assessment & Plan Note (Signed)
Her blood pressure remains mildly elevated. She states that it typically is that her weight when she measures it at home. She has white coat hypertension episodes typically higher when she does see a doctor.  I've asked her to continue with a good weight loss program. She is to continue to watch her salt intake.

## 2011-02-21 NOTE — Assessment & Plan Note (Addendum)
She's had an echocardiogram before which revealed normal left ventricular systolic function. She has documented diastolic dysfunction. Her current EKG reveals a nonspecific interventricular conduction delay which I suspect is due to the left ventricular hypertrophy and possible right ventricular hypertrophy.  We'll get an echocardiogram.  I think she should continue with her weight loss efforts.

## 2011-02-21 NOTE — Progress Notes (Addendum)
Nancy West Date of Birth  11-04-1945 [facility] [address]     [address] [place], [place]  [address] [phone]  Fax  [phone]  History of Present Illness:  Nancy West is a 65 year old female who is a previous patient of Dr. Reyes Ivan. She has a history of morbid obesity, diastolic dysfunction, hypertension, and cigarette smoking. She presents today for further evaluation of an abnormal EKG.    She has a history of hypertension. Blood pressure has been fairly good and her home meds.   It is a bit elevated today. She has white coat hypertension. She denies any chest pain or shortness breath.  Current Outpatient Prescriptions on File Prior to Visit  Medication Sig Dispense Refill  . aspirin 81 MG tablet Take 81 mg by mouth daily.        Marland Kitchen atorvastatin (LIPITOR) 40 MG tablet Take 1 tablet (40 mg total) by mouth daily.  90 tablet  1  . benazepril-hydrochlorthiazide (LOTENSIN HCT) 20-25 MG per tablet Take 1 tablet by mouth daily.  90 tablet  1  . Cholecalciferol (VITAMIN D) 1000 UNITS capsule Take 1,000 Units by mouth 2 (two) times daily.       Marland Kitchen dexlansoprazole (DEXILANT) 60 MG capsule Take 1 capsule (60 mg total) by mouth daily.  25 capsule  0  . furosemide (LASIX) 40 MG tablet Take 40 mg by mouth daily.        Marland Kitchen ibuprofen (ADVIL,MOTRIN) 200 MG tablet Take 200 mg by mouth every 6 (six) hours as needed.        Marland Kitchen levothyroxine (SYNTHROID, LEVOTHROID) 50 MCG tablet Take 1 tablet (50 mcg total) by mouth daily.  90 tablet  1  . DISCONTD: furosemide (LASIX) 40 MG tablet TAKE 1 TABLET BY MOUTH TWICE A DAY  90 tablet  1    Allergies  Allergen Reactions  . Augmentin (Amoxicillin-Pot Clavulanate)     Abdominal pain    Past Medical History  Diagnosis Date  . Obesity   . Hypertension   . Hyperlipidemia   . Diabetes mellitus   . Unspecified hypothyroidism   . Venous stasis of lower extremity   . Diastolic dysfunction   . Tobacco dependence      Past Surgical History  Procedure Date  . Cholecystectomy 1980  . Appendectomy     History  Smoking status  . Current Everyday Smoker -- 1.0 packs/day  . Types: Cigarettes  Smokeless tobacco  . Never Used  Comment: 1 ppd since age 68; quit x 7 months with Chantix    History  Alcohol Use  . Yes    daily, 2-3 glasses of wine every evening    Family History  Problem Relation Age of Onset  . Cancer Mother     breast cancer (70), colon cancer  . Diabetes Mother   . Hypertension Mother   . Diabetes Father     Reviw of Systems:  Reviewed in the HPI.  All other systems are negative.  Physical Exam: BP 154/100  Pulse 84  Ht 5\' 8"  (1.727 m)  Wt 339 lb (153.769 kg)  BMI 51.54 kg/m2 The patient is alert and oriented x 3.  The mood and affect are normal.   Skin: warm and dry.  Color is normal.    HEENT:   the sclera are nonicteric.  The mucous membranes are moist.  The carotids are 2+ without bruits.  There is no thyromegaly.  There is no JVD.  Lungs: clear.  The chest wall is non tender.    Heart: regular rate with a normal S1 and S2.  There are no murmurs, gallops, or rubs. The PMI is not displaced.     Abdomen: She is morbidly obese. good bowel sounds.  There is no guarding or rebound.  There is no hepatosplenomegaly or tenderness.  There are no masses.   Extremities:  There is trace edema.  The legs are without rashes.  The distal pulses are intact.   Neuro:  Cranial nerves II - XII are intact.  Motor and sensory functions are intact.    The gait is normal.  ECG: Normal sinus rhythm with occasional PVCs. She has a nonspecific interventricular conduction delay. She has nonspecific ST and T wave abnormalities of the anterior leads.  Assessment / Plan:

## 2011-02-21 NOTE — Assessment & Plan Note (Signed)
She'll continue with a good diet and exercise program. She's already lost about 60 pounds over the past several years.

## 2011-02-22 ENCOUNTER — Encounter: Payer: Self-pay | Admitting: Family Medicine

## 2011-02-22 ENCOUNTER — Ambulatory Visit (INDEPENDENT_AMBULATORY_CARE_PROVIDER_SITE_OTHER): Payer: PRIVATE HEALTH INSURANCE | Admitting: Family Medicine

## 2011-02-22 DIAGNOSIS — E039 Hypothyroidism, unspecified: Secondary | ICD-10-CM

## 2011-02-22 DIAGNOSIS — E119 Type 2 diabetes mellitus without complications: Secondary | ICD-10-CM

## 2011-02-22 DIAGNOSIS — E559 Vitamin D deficiency, unspecified: Secondary | ICD-10-CM

## 2011-02-22 DIAGNOSIS — F17200 Nicotine dependence, unspecified, uncomplicated: Secondary | ICD-10-CM

## 2011-02-22 DIAGNOSIS — E7800 Pure hypercholesterolemia, unspecified: Secondary | ICD-10-CM

## 2011-02-22 DIAGNOSIS — I10 Essential (primary) hypertension: Secondary | ICD-10-CM

## 2011-02-22 MED ORDER — BENAZEPRIL-HYDROCHLOROTHIAZIDE 20-12.5 MG PO TABS: 2.0000 | ORAL_TABLET | Freq: Every day | ORAL | Status: DC

## 2011-02-22 NOTE — Patient Instructions (Signed)
If you have recurrent reflux symptoms (upper abdominal/chest burning, pain), then try Prilosec OTC.  See if you can figure out if there are particular triggers for your symptoms.  If so, you can use medicine prior to these triggers.  If you are having frequent symptoms despite proper diet, then you made need to take the medication daily.  If Prilosec OTC is ineffective, call for Dexilant prescription.  Continue to check your BP at home.  If you have side effects or problems, please call.  We're changing your Lotensin HCT to 20/12.5, and you can take 2 tablets at the same time (this is essentially just increasing the benazepril dose).  Goal BP is <130/80

## 2011-02-22 NOTE — Progress Notes (Signed)
Patient presents for routine f/u.  She was seen earlier this month with reflux.  Has been taking Dexilant and no further symptoms.  Took last pill yesterday.  Saw cardiologist yesterday, plans for echo.  BP a little high at office.  No med changes were made.  BP's at home have been low 140's/70's.  Didn't bring list of BP's but reports never seeing BP into 120's, occasionally into 130's, but frequently low 140's.  Denies headaches, chest pain, dizziness.  Down to smoking only 2 cigarettes each morning.  Her scale at home is consistent with ours (weight was much higher at cardiologist yesterday). She has lost 9 pounds in the last month, and is down from approximately 450 pounds per pt (2 years ago).  She joined the gym, and is working with a Psychologist, educational 3 days/week.  She is working with bands and bicycle for warm-up.  Gradually working her way up with exercise. Continues to try and watch her diet--no sweets, no regular sodas.  Occasional alcohol (bloody mary last night); drinking lots of water.  Has cut down on her portion sizes, cutting all portions in half.  Denies feeling hungry, cravings.  Eats fruits as snacks when hungry.  Hypothyroidism--last TSH was 5, borderline. No med changes were made, due to be re-checked today.  DM--diet controlled.  Doesn't check sugars.  Last A1c 6.4 3 months ago.  Denies polydipsia or polyuria  Past Medical History  Diagnosis Date  . Obesity   . Hypertension   . Hyperlipidemia   . Diabetes mellitus   . Unspecified hypothyroidism   . Venous stasis of lower extremity   . Diastolic dysfunction   . Tobacco dependence     Past Surgical History  Procedure Date  . Cholecystectomy 1980  . Appendectomy     History   Social History  . Marital Status: Married    Spouse Name: N/A    Number of Children: 0  . Years of Education: N/A   Occupational History  . Retired (Presenter, broadcasting) [employer]   Social History Main Topics  . Smoking status: Current Everyday  Smoker -- 1.0 packs/day    Types: Cigarettes  . Smokeless tobacco: Never Used   Comment: 1 ppd since age 64; quit x 7 months with Chantix  . Alcohol Use: Yes     daily, 2-3 glasses of wine every evening  . Drug Use: No  . Sexually Active: Not Currently   Other Topics Concern  . 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 Cancer Mother     breast cancer (70), colon cancer  . Diabetes Mother   . Hypertension Mother   . Diabetes Father     Current outpatient prescriptions:aspirin 81 MG tablet, Take 81 mg by mouth daily.  , Disp: , Rfl: ;  atorvastatin (LIPITOR) 40 MG tablet, Take 1 tablet (40 mg total) by mouth daily., Disp: 90 tablet, Rfl: 1;  Cholecalciferol (VITAMIN D) 1000 UNITS capsule, Take 1,000 Units by mouth 2 (two) times daily. , Disp: , Rfl: ;  furosemide (LASIX) 40 MG tablet, Take 40 mg by mouth daily.  , Disp: , Rfl:  ibuprofen (ADVIL,MOTRIN) 200 MG tablet, Take 200 mg by mouth every 6 (six) hours as needed.  , Disp: , Rfl: ;  levothyroxine (SYNTHROID, LEVOTHROID) 50 MCG tablet, Take 1 tablet (50 mcg total) by mouth daily.,  Disp: 90 tablet, Rfl: 1;  benazepril-hydrochlorthiazide (LOTENSIN HCT) 20-12.5 MG per tablet, Take 2 tablets by mouth daily., Disp: 90 tablet, Rfl: 1 dexlansoprazole (DEXILANT) 60 MG capsule, Take 1 capsule (60 mg total) by mouth daily., Disp: 25 capsule, Rfl: 0  Allergies  Allergen Reactions  . Augmentin (Amoxicillin-Pot Clavulanate)     Abdominal pain   ROS:  Denies fevers, URI symptoms, GI complaints, skin concerns, chest pain, headaches, dizziness  PHYSICAL EXAM: BP 144/78  Pulse 68  Ht 5\' 8"  (1.727 m)  Wt 316 lb (143.337 kg)  BMI 48.05 kg/m2 Pleasant, obese female in no distress Neck: No lymphadenopathy, thyromegaly or carotid bruit  Heart: Regular rate and rhythm, no murmurs  Lungs: Clear bilaterally  Abdomen: Obese, nontender, no  organomegaly or masses  Extremities: Venous stasis changes--dark discoloration of both feet; feet warm 1+ pretibeal edema at ankles  Psych: Normal mood, affect, hygiene and grooming   ASSESSMENT/PLAN: 1. Type II or unspecified type diabetes mellitus without mention of complication, not stated as uncontrolled  Glucose, random, Hemoglobin A1c, Microalbumin / creatinine urine ratio  2. Essential hypertension, benign  Comprehensive metabolic panel, benazepril-hydrochlorthiazide (LOTENSIN HCT) 20-12.5 MG per tablet  3. Pure hypercholesterolemia  Lipid panel  4. Unspecified hypothyroidism  TSH  5. Unspecified vitamin D deficiency  Vitamin D 25 hydroxy  6. Obesity, Class III, BMI 40-49.9 (morbid obesity)    7. Tobacco use disorder    8. Essential hypertension, benign  Comprehensive metabolic panel, benazepril-hydrochlorthiazide (LOTENSIN HCT) 20-12.5 MG per tablet   suboptimally controlled.  Goal <130/80   GERD--reviewed reflux precautions.  Will try remaining off PPI. If gets recurrent symptoms then trial of OTC Prilosec.  If this is ineffective can call for Dexilant prescription.  DM--since she is continuing to lose weight, and eating well and exercising, will hold off on checking A1c until 3 months from now. Will check fasting sugar today.  HTN--doesn't appear to be at goal, even at home--BP's in 140's.  Since microalbumin was elevated and sugars are okay, will plan to increase Lotensin HCT dose to 40/25--by changing tab to 20/12.5 and taking 2 daily  Encouraged to continue to cut back on smoking, and quit entirely.  F/u 3 months with fasting labs prior to visit

## 2011-02-23 ENCOUNTER — Encounter: Payer: Self-pay | Admitting: Family Medicine

## 2011-02-23 ENCOUNTER — Encounter: Payer: Self-pay | Admitting: Cardiovascular Disease

## 2011-02-23 LAB — GLUCOSE, RANDOM: Glucose, Bld: 91 mg/dL (ref 70–99)

## 2011-02-23 LAB — TSH: TSH: 3.823 u[IU]/mL (ref 0.350–4.500)

## 2011-03-01 ENCOUNTER — Ambulatory Visit (HOSPITAL_COMMUNITY): Payer: PRIVATE HEALTH INSURANCE | Attending: Cardiovascular Disease | Admitting: Radiology

## 2011-03-01 DIAGNOSIS — I509 Heart failure, unspecified: Secondary | ICD-10-CM | POA: Insufficient documentation

## 2011-03-01 DIAGNOSIS — I10 Essential (primary) hypertension: Secondary | ICD-10-CM | POA: Insufficient documentation

## 2011-03-01 DIAGNOSIS — R609 Edema, unspecified: Secondary | ICD-10-CM

## 2011-03-01 DIAGNOSIS — R0609 Other forms of dyspnea: Secondary | ICD-10-CM | POA: Insufficient documentation

## 2011-03-01 DIAGNOSIS — E7800 Pure hypercholesterolemia, unspecified: Secondary | ICD-10-CM | POA: Insufficient documentation

## 2011-03-01 DIAGNOSIS — F17200 Nicotine dependence, unspecified, uncomplicated: Secondary | ICD-10-CM | POA: Insufficient documentation

## 2011-03-01 DIAGNOSIS — I079 Rheumatic tricuspid valve disease, unspecified: Secondary | ICD-10-CM | POA: Insufficient documentation

## 2011-03-01 DIAGNOSIS — I059 Rheumatic mitral valve disease, unspecified: Secondary | ICD-10-CM | POA: Insufficient documentation

## 2011-03-01 DIAGNOSIS — R0989 Other specified symptoms and signs involving the circulatory and respiratory systems: Secondary | ICD-10-CM | POA: Insufficient documentation

## 2011-03-01 DIAGNOSIS — R9431 Abnormal electrocardiogram [ECG] [EKG]: Secondary | ICD-10-CM

## 2011-03-05 ENCOUNTER — Telehealth: Payer: Self-pay | Admitting: Cardiology

## 2011-03-05 NOTE — Telephone Encounter (Signed)
Reviewed echo results with patient. Verbalizes understanding. She is still working on weight loss and trying to quick smoking.

## 2011-03-05 NOTE — Telephone Encounter (Signed)
Message copied by Karle Plumber on Mon Mar 05, 2011  4:56 PM ------      Message from: [place], [place]      Created: Fri Mar 02, 2011  5:19 PM       Normal LV systolic function.  The RV is markedly enlarged - suggestive of lung disease.  Given her obesity, it also may be due to restrictive lung disease due to her weight.            No new recs: - stopping smoking and weight loss that the solutions for this RV overload.

## 2011-04-22 ENCOUNTER — Other Ambulatory Visit: Payer: Self-pay | Admitting: Family Medicine

## 2011-05-02 ENCOUNTER — Other Ambulatory Visit: Payer: Self-pay | Admitting: Family Medicine

## 2011-05-18 ENCOUNTER — Other Ambulatory Visit: Payer: PRIVATE HEALTH INSURANCE

## 2011-05-23 ENCOUNTER — Encounter: Payer: PRIVATE HEALTH INSURANCE | Admitting: Family Medicine

## 2011-05-28 ENCOUNTER — Other Ambulatory Visit: Payer: Self-pay | Admitting: Family Medicine

## 2011-06-04 ENCOUNTER — Other Ambulatory Visit: Payer: PRIVATE HEALTH INSURANCE

## 2011-06-04 DIAGNOSIS — E559 Vitamin D deficiency, unspecified: Secondary | ICD-10-CM

## 2011-06-04 DIAGNOSIS — E119 Type 2 diabetes mellitus without complications: Secondary | ICD-10-CM

## 2011-06-04 DIAGNOSIS — I10 Essential (primary) hypertension: Secondary | ICD-10-CM

## 2011-06-04 DIAGNOSIS — E7800 Pure hypercholesterolemia, unspecified: Secondary | ICD-10-CM

## 2011-06-05 LAB — COMPREHENSIVE METABOLIC PANEL
AST: 14 U/L (ref 0–37)
Albumin: 3.9 g/dL (ref 3.5–5.2)
BUN: 48 mg/dL — ABNORMAL HIGH (ref 6–23)
CO2: 32 mEq/L (ref 19–32)
Calcium: 8.9 mg/dL (ref 8.4–10.5)
Chloride: 100 mEq/L (ref 96–112)
Glucose, Bld: 104 mg/dL — ABNORMAL HIGH (ref 70–99)
Potassium: 3.8 mEq/L (ref 3.5–5.3)

## 2011-06-05 LAB — LIPID PANEL
Cholesterol: 110 mg/dL (ref 0–200)
HDL: 32 mg/dL — ABNORMAL LOW (ref >39)
Triglycerides: 68 mg/dL (ref <150)

## 2011-06-05 LAB — MICROALBUMIN / CREATININE URINE RATIO: Microalb, Ur: 24.61 mg/dL — ABNORMAL HIGH (ref 0.00–1.89)

## 2011-06-06 ENCOUNTER — Ambulatory Visit (INDEPENDENT_AMBULATORY_CARE_PROVIDER_SITE_OTHER): Payer: PRIVATE HEALTH INSURANCE | Admitting: Family Medicine

## 2011-06-06 ENCOUNTER — Encounter: Payer: Self-pay | Admitting: Family Medicine

## 2011-06-06 VITALS — BP 138/70 | HR 68 | Ht 68.0 in | Wt 347.0 lb

## 2011-06-06 DIAGNOSIS — E559 Vitamin D deficiency, unspecified: Secondary | ICD-10-CM

## 2011-06-06 DIAGNOSIS — E7800 Pure hypercholesterolemia, unspecified: Secondary | ICD-10-CM

## 2011-06-06 DIAGNOSIS — Z79899 Other long term (current) drug therapy: Secondary | ICD-10-CM

## 2011-06-06 DIAGNOSIS — I10 Essential (primary) hypertension: Secondary | ICD-10-CM

## 2011-06-06 DIAGNOSIS — E119 Type 2 diabetes mellitus without complications: Secondary | ICD-10-CM

## 2011-06-06 MED ORDER — VARENICLINE TARTRATE 1 MG PO TABS: 1.0000 mg | ORAL_TABLET | Freq: Two times a day (BID) | ORAL | Status: AC

## 2011-06-06 MED ORDER — VARENICLINE TARTRATE 0.5 MG X 11 & 1 MG X 42 PO MISC: ORAL | Status: AC

## 2011-06-06 MED ORDER — BENAZEPRIL-HYDROCHLOROTHIAZIDE 20-12.5 MG PO TABS: ORAL_TABLET | ORAL | Status: DC

## 2011-06-06 NOTE — Patient Instructions (Signed)
Please quit smoking--use the Chantix to help you, as it worked in the past. Please try compression stockings, and keeping legs elevated when you're able to during the day. Eat properly--healthy foods, small quantities.  Try and lose the weight you re-gained. Drink more water--you need to drink at least 3 8 ounce glasses of water in addition to all the caffeine-free diet soda you're drinking now. Try and resume exercise--start with non-weight-bearing activities until your foot/ankle is better.  Minimum of 30-60 minutes every day, but can be broken down to 10-15 minute increments

## 2011-06-06 NOTE — Progress Notes (Signed)
Patient presents for follow-up, labs done prior to visit.  Diabetes--diet controlled. Doesn't check blood sugars.  Admits to eating poorly, not exercising and gaining weight.  Denies polydipsia, polyuria.  Hypertension--Has a machine at home, but hadn't been checking BP at home.  Just started checking this week, and has been 140/80 at all times of the day.  Pain in R foot, told she had torn ligaments for which she was seen by ortho.  Pain comes and goes, lasts about a week. This last bout, however, has been going on for 6 weeks; unable to walk for 4 of those week--didn't exercise, and instead smoked and ate.  Gained a lot of weight.  Still walking with cane, but pain is improving.  Was taking ibuprofen 600mg  once daily during that time.  Past Medical History  Diagnosis Date  . Obesity   . Hypertension   . Hyperlipidemia   . Diabetes mellitus   . Unspecified hypothyroidism   . Venous stasis of lower extremity   . Diastolic dysfunction   . Tobacco dependence     Past Surgical History  Procedure Date  . Cholecystectomy 1980  . Appendectomy     History   Social History  . Marital Status: Married    Spouse Name: N/A    Number of Children: 0  . Years of Education: N/A   Occupational History  . Retired (Presenter, broadcasting) [employer]   Social History Main Topics  . Smoking status: Current Everyday Smoker -- 1.0 packs/day for .5 years    Types: Cigarettes  . Smokeless tobacco: Never Used   Comment: 1 ppd since age 59; quit x 7 months with Chantix in past  . Alcohol Use: Yes     maybe 1 drink per week.  . Drug Use: No  . Sexually Active: Not Currently   Other Topics Concern  . 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 Cancer Mother     breast cancer (70), colon cancer  . Diabetes Mother   . Hypertension Mother   . Diabetes Father    Current Outpatient Prescriptions on  File Prior to Visit  Medication Sig Dispense Refill  . aspirin 81 MG tablet Take 81 mg by mouth daily.        Marland Kitchen atorvastatin (LIPITOR) 40 MG tablet TAKE 1 TABLET BY MOUTH EVERY DAY  90 tablet  1  . Cholecalciferol (VITAMIN D) 1000 UNITS capsule Take 1,000 Units by mouth daily.       . furosemide (LASIX) 40 MG tablet Take 40 mg by mouth daily.        Marland Kitchen ibuprofen (ADVIL,MOTRIN) 200 MG tablet Take 200 mg by mouth every 6 (six) hours as needed.        Marland Kitchen levothyroxine (SYNTHROID, LEVOTHROID) 50 MCG tablet TAKE 1 TABLET BY MOUTH EVERY DAY  90 tablet  1  . DISCONTD: benazepril-hydrochlorthiazide (LOTENSIN HCT) 20-12.5 MG per tablet TAKE 2 TABLETS BY MOUTH DAILY.  90 tablet  0  . DISCONTD: furosemide (LASIX) 40 MG tablet TAKE 1 TABLET BY MOUTH TWICE A DAY  90 tablet  0   Allergies  Allergen Reactions  . Augmentin (Amoxicillin-Pot Clavulanate)     Abdominal pain    ROS:  Denies fevers, URI symptoms, cough, shortness of breath, chest pain, headaches, dizziness, GI complaints (controlled with prilosec).  Denies rashes. + foot pain.  See HPI  PHYSICAL EXAM: BP 140/90  Pulse 68  Ht 5\' 8"  (1.727 m)  Wt 347 lb (157.398 kg)  BMI 52.76 kg/m2 138/70 on repeat BP Pleasant, obese female in no distress  Neck: No lymphadenopathy, thyromegaly or carotid bruit  Heart: Regular rate and rhythm, no murmurs  Lungs: Clear bilaterally  Abdomen: Obese, nontender, no organomegaly or masses  Extremities: Venous stasis changes--dark discoloration of both feet; feet warm  1-2+ pretibeal edema at ankles  Psych: Normal mood, affect, hygiene and grooming   Labs: Lab Results  Component Value Date   HGBA1C 6.4* 06/04/2011     Chemistry      Component Value Date/Time   NA 144 06/04/2011 0839   K 3.8 06/04/2011 0839   CL 100 06/04/2011 0839   CO2 32 06/04/2011 0839   BUN 48* 06/04/2011 0839   CREATININE 1.38* 06/04/2011 0839      Component Value Date/Time   CALCIUM 8.9 06/04/2011 0839   ALKPHOS 119*  06/04/2011 0839   AST 14 06/04/2011 0839   ALT 9 06/04/2011 0839   BILITOT 1.3* 06/04/2011 0839     Lab Results  Component Value Date   CHOL 110 06/04/2011   HDL 32* 06/04/2011   LDLCALC 64 06/04/2011   TRIG 68 06/04/2011   CHOLHDL 3.4 06/04/2011   Vitamin D was 44 Microalbumin/Cr ratio was abnormal at 216.6  She drinks four 20 oz bottles of caffeine-free diet coke daily, and no water  ASSESSMENT/PLAN: 1. Encounter for long-term (current) use of other medications  Basic metabolic panel  2. Type II or unspecified type diabetes mellitus without mention of complication, not stated as uncontrolled    3. Essential hypertension, benign    4. Unspecified vitamin D deficiency    5. Pure hypercholesterolemia     DM--diet controlled Tobacco use-- smoking cessation encouraged.  Recommended restarting Chantix since she had success with this in the past.  Lipids--HDL much lower--due to lack of exercise and increase in smoking.  Continue current meds, re-start exercise and quit smoking  Elevated BUN/Cr--recommended trial of increasing fluids and re-checking b-met in 1 month.  HTN--not at goal, but has re-gained a lot of weight and isn't exercising.  Will hold off on changing her medications at this time, but have her continue to monitor BP.  Re-evaluate treatment when she returns in 1 month.  Low sodium diet.  Microalbuminuria--unchanged.  Needs better BP control.  If Cr improves, consider Metformin  Venous stasis--keep legs elevated.  Trial of compression stockings (written rx given to patient)  F/u 1 month with Bmet prior

## 2011-07-04 ENCOUNTER — Other Ambulatory Visit: Payer: PRIVATE HEALTH INSURANCE

## 2011-07-04 DIAGNOSIS — Z79899 Other long term (current) drug therapy: Secondary | ICD-10-CM

## 2011-07-05 LAB — BASIC METABOLIC PANEL
BUN: 48 mg/dL — ABNORMAL HIGH (ref 6–23)
Creat: 1.42 mg/dL — ABNORMAL HIGH (ref 0.50–1.10)
Potassium: 4.1 mEq/L (ref 3.5–5.3)

## 2011-07-06 ENCOUNTER — Other Ambulatory Visit: Payer: PRIVATE HEALTH INSURANCE

## 2011-07-07 ENCOUNTER — Other Ambulatory Visit: Payer: Self-pay | Admitting: Family Medicine

## 2011-07-11 ENCOUNTER — Ambulatory Visit: Payer: PRIVATE HEALTH INSURANCE | Admitting: Family Medicine

## 2011-07-16 ENCOUNTER — Encounter: Payer: Self-pay | Admitting: Family Medicine

## 2011-07-16 ENCOUNTER — Ambulatory Visit (INDEPENDENT_AMBULATORY_CARE_PROVIDER_SITE_OTHER): Payer: PRIVATE HEALTH INSURANCE | Admitting: Family Medicine

## 2011-07-16 VITALS — BP 140/80 | HR 72 | Ht 68.0 in | Wt 351.0 lb

## 2011-07-16 DIAGNOSIS — E66813 Obesity, class 3: Secondary | ICD-10-CM

## 2011-07-16 DIAGNOSIS — F17200 Nicotine dependence, unspecified, uncomplicated: Secondary | ICD-10-CM

## 2011-07-16 DIAGNOSIS — I10 Essential (primary) hypertension: Secondary | ICD-10-CM

## 2011-07-16 MED ORDER — AMLODIPINE BESYLATE 5 MG PO TABS: 5.0000 mg | ORAL_TABLET | Freq: Every day | ORAL | Status: DC

## 2011-07-16 NOTE — Patient Instructions (Addendum)
Continue your benazepril HCTZ as prescribed.  Please make sure you are drinking plenty of fluids (at least 8 glasses/day). Start amlodipine 5mg  at just 1/2 tablet for the first week.  Monitor blood pressure daily.  If BP's are still >140/90 after a week of 1/2 tablet, then increase to the full tablet. Please write down your blood pressures, and BRING YOUR LIST to your next visit. Consider trying to cut back to 1/2 tablet on Lasix if/when swelling in legs improve. Go back up to full tablet if swelling worsens on lower dose.  Make sure you are keeping your sodium intake to <2000 mg  Continue to try and cut back on your smoking.  Watch your portion control and make healthy food choices.  Start exercising, gradually increasing to a goal of 45 minutes daily or more.  Please bring your BP machine to your next visit (along with your BP list)

## 2011-07-16 NOTE — Progress Notes (Signed)
Pt presents for 1 month f/u on her BP.  Didn't bring list of BP's, but reports they are running 150/80 at home.  Not exercising.  Foot is doing better, but then threw her back out.  Chantix wasn't covered by her insurance. Would cost $400 (previously was covered by her insurance, and she did well with it).  She is upset that she is unable to lose weight on her own.  Just joined [employer], which is $300; hasn't received food/program yet.  She also has ordered a stationary bicycle, which hasn't been delivered yet.  She opted to spend the money on treating her weight loss, rather than on her smoking cessation (Chantix) right now.  She is down to 1/2 PPD.  She is depressed related to her weight.  Husband is helpful and supportive.  She has been writing down what she eats, but didn't bring journal to visit, frustrated that this hasn't helped her lose weight.  Past Medical History  Diagnosis Date  . Obesity   . Hypertension   . Hyperlipidemia   . Diabetes mellitus   . Unspecified hypothyroidism   . Venous stasis of lower extremity   . Diastolic dysfunction   . Tobacco dependence     Past Surgical History  Procedure Date  . Cholecystectomy 1980  . Appendectomy     History   Social History  . Marital Status: Married    Spouse Name: N/A    Number of Children: 0  . Years of Education: N/A   Occupational History  . Retired (Presenter, broadcasting) [employer]   Social History Main Topics  . Smoking status: Current Everyday Smoker -- 1.0 packs/day for .5 years    Types: Cigarettes  . Smokeless tobacco: Never Used   Comment: 1 ppd since age 31; quit x 7 months with Chantix in past  . Alcohol Use: Yes     maybe 1 drink per week.  . Drug Use: No  . Sexually Active: Not Currently   Other Topics Concern  . 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 Cancer Mother     breast cancer (70),  colon cancer  . Diabetes Mother   . Hypertension Mother   . Diabetes Father    Current Outpatient Prescriptions on File Prior to Visit  Medication Sig Dispense Refill  . aspirin 81 MG tablet Take 81 mg by mouth daily.        Marland Kitchen atorvastatin (LIPITOR) 40 MG tablet TAKE 1 TABLET BY MOUTH EVERY DAY  90 tablet  1  . benazepril-hydrochlorthiazide (LOTENSIN HCT) 20-12.5 MG per tablet Take 2 tablets once daily  180 tablet  0  . Cholecalciferol (VITAMIN D) 1000 UNITS capsule Take 1,000 Units by mouth daily.       . furosemide (LASIX) 40 MG tablet Take 40 mg by mouth daily.        Marland Kitchen ibuprofen (ADVIL,MOTRIN) 200 MG tablet Take 200 mg by mouth every 6 (six) hours as needed.        Marland Kitchen levothyroxine (SYNTHROID, LEVOTHROID) 50 MCG tablet TAKE 1 TABLET BY MOUTH EVERY DAY  90 tablet  1  . omeprazole (PRILOSEC OTC) 20 MG tablet Take 20 mg by mouth daily.        Marland Kitchen DISCONTD: furosemide (LASIX) 40 MG tablet TAKE 1 TABLET BY MOUTH TWICE A DAY  90 tablet  0  .  amLODipine (NORVASC) 5 MG tablet Take 1 tablet (5 mg total) by mouth daily. Start at 1/2 tablet and increase after a week to full tablet if BP still >140/90  30 tablet  1  . varenicline (CHANTIX CONTINUING MONTH PAK) 1 MG tablet Take 1 tablet (1 mg total) by mouth 2 (two) times daily.  60 tablet  1  . varenicline (CHANTIX STARTING MONTH PAK) 0.5 MG X 11 & 1 MG X 42 tablet Take one 0.5mg  tablet by mouth once daily for 3 days, then increase to one 0.5mg  tablet twice daily for 3 days, then increase to one 1mg  tablet twice daily.  53 tablet  0    Allergies  Allergen Reactions  . Augmentin (Amoxicillin-Pot Clavulanate)     Abdominal pain   ROS:  Denies headaches, chest pain, shortness of breath.  +weight gain.  Denies headaches, diziness. +depressed/frustrated.  Denies fevers, URI symptoms  PHYSICAL EXAM: BP 140/80  Pulse 72  Ht 5\' 8"  (1.727 m)  Wt 351 lb (159.213 kg)  BMI 53.37 kg/m2 126/68 on repeat by MD Well developed, morbidly obese female in no  distress Neck: no lymphadenopathy Heart: regular rate and rhythm without murmur Lungs: clear bilaterally Extremities: 1-2+ pretibial edema Skin: intact  ASSESSMENT/PLAN:  1. Essential hypertension, benign  amLODipine (NORVASC) 5 MG tablet  2. Tobacco use disorder    3. Obesity, Class III, BMI 40-49.9 (morbid obesity)     HTN--suboptimally controlled per home numbers.  Repeat BP here was improved though.  Kidney function is still borderline.  Both BUN/Cr are elevated, suggested some pre-renal component, and she is on 2 diuretics.  Encouraged adequate fluid intake. Consider trying to cut back to 1/2 tablet on Lasix, if/when edema improves.. Start amlodipine 5mg  at just 1/2 tablet for the first week.  Monitor blood pressure daily.  If BP's are still >140/90 after a week of 1/2 tablet, then increase to the full tablet.   Encouraged to eat healthy, proper portion sizes, and to gradually work up to 45-60 minutes of exercise daily, but starting slow.  Discussed realistic weight loss expectations.   Once her weight is improving, will need to refocus on quitting smoking.  Risks of smoking reviewed.  F/u 2 months with b-met and A1c done prior

## 2011-08-02 ENCOUNTER — Other Ambulatory Visit: Payer: Self-pay | Admitting: Family Medicine

## 2011-08-31 ENCOUNTER — Other Ambulatory Visit: Payer: Self-pay | Admitting: Family Medicine

## 2011-08-31 DIAGNOSIS — I10 Essential (primary) hypertension: Secondary | ICD-10-CM

## 2011-08-31 NOTE — Telephone Encounter (Signed)
Is this ok?

## 2011-08-31 NOTE — Telephone Encounter (Signed)
done

## 2011-09-07 ENCOUNTER — Encounter: Payer: Self-pay | Admitting: Internal Medicine

## 2011-09-13 ENCOUNTER — Ambulatory Visit (INDEPENDENT_AMBULATORY_CARE_PROVIDER_SITE_OTHER): Payer: PRIVATE HEALTH INSURANCE | Admitting: Family Medicine

## 2011-09-13 ENCOUNTER — Encounter: Payer: Self-pay | Admitting: Family Medicine

## 2011-09-13 VITALS — BP 120/72 | HR 72 | Ht 68.0 in | Wt 284.0 lb

## 2011-09-13 DIAGNOSIS — E7800 Pure hypercholesterolemia, unspecified: Secondary | ICD-10-CM

## 2011-09-13 DIAGNOSIS — E039 Hypothyroidism, unspecified: Secondary | ICD-10-CM

## 2011-09-13 DIAGNOSIS — I10 Essential (primary) hypertension: Secondary | ICD-10-CM

## 2011-09-13 DIAGNOSIS — E119 Type 2 diabetes mellitus without complications: Secondary | ICD-10-CM

## 2011-09-13 DIAGNOSIS — F17200 Nicotine dependence, unspecified, uncomplicated: Secondary | ICD-10-CM

## 2011-09-13 LAB — POCT GLYCOSYLATED HEMOGLOBIN (HGB A1C): Hemoglobin A1C: 5.6

## 2011-09-13 MED ORDER — LEVOTHYROXINE SODIUM 50 MCG PO TABS: 50.0000 ug | ORAL_TABLET | Freq: Every day | ORAL | Status: DC

## 2011-09-13 MED ORDER — AMLODIPINE BESYLATE 5 MG PO TABS: 5.0000 mg | ORAL_TABLET | Freq: Every day | ORAL | Status: DC

## 2011-09-13 NOTE — Progress Notes (Signed)
Patient presents for f/u on hypertension and diabetes.  She has lost a lot of weight on Nutrasystem since her last visit, 2 months ago.  She is exercising 1.5 hours throughout the day on her stationary bicycle.  Knees are feeling are better, not needing a cane.  She is breathing better, even though she continues to smoke (1/2 PPD)  Hypertension follow-up:  She started on amlodipine at last visit.  Started with 1/2 tablet, then increased to full (because BP's not yet at goal).  Blood pressures on full tablet have been 115-130/60-70.  One time had low 95/54 (repeat was 101/54), and didn't feel bad at the time.  Denies dizziness, headaches, chest pain.  Denies side effects of medications. Ankles stlll swell, but the swelling higher in the legs to her knees is much improved.  She continues to use full pill of Lasix.  Legs are much softer than they used to be. No worsening of swelling since starting amlodipine, improved overall.  Diabetes:  Doesn't check sugars elsewhere.  Exercising and losing weight, as stated above. Denies polydipsia or polyuria.  Hypothyroidism: Denies any skin/hair changes, denies fatigue, energy is better.  Bowels are normal, improved.  Past Medical History  Diagnosis Date  . Obesity   . Hypertension   . Hyperlipidemia   . Diabetes mellitus   . Unspecified hypothyroidism   . Venous stasis of lower extremity   . Diastolic dysfunction   . Tobacco dependence     Past Surgical History  Procedure Date  . Cholecystectomy 1980  . Appendectomy     History   Social History  . Marital Status: Married    Spouse Name: N/A    Number of Children: 0  . Years of Education: N/A   Occupational History  . Retired (Presenter, broadcasting) [employer]   Social History Main Topics  . Smoking status: Current Everyday Smoker -- 1.0 packs/day for .5 years    Types: Cigarettes  . Smokeless tobacco: Never Used   Comment: 1 ppd since age 10; quit x 7 months with Chantix in past  . Alcohol  Use: Yes     maybe 1 drink per week.  . Drug Use: No  . Sexually Active: Not Currently   Other Topics Concern  . 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 Cancer Mother     breast cancer (70), colon cancer  . Diabetes Mother   . Hypertension Mother   . Diabetes Father    Current Outpatient Prescriptions on File Prior to Visit  Medication Sig Dispense Refill  . amLODipine (NORVASC) 5 MG tablet Take 1 tablet (5 mg total) by mouth daily.  90 tablet  1  . aspirin 81 MG tablet Take 81 mg by mouth daily.        Marland Kitchen atorvastatin (LIPITOR) 40 MG tablet TAKE 1 TABLET BY MOUTH EVERY DAY  90 tablet  1  . benazepril-hydrochlorthiazide (LOTENSIN HCT) 20-12.5 MG per tablet TAKE 2 TABLETS BY MOUTH EVERY DAY  180 tablet  1  . Cholecalciferol (VITAMIN D) 1000 UNITS capsule Take 2,000 Units by mouth daily.       . furosemide (LASIX) 40 MG tablet Take 40 mg by mouth daily.        Marland Kitchen ibuprofen (ADVIL,MOTRIN) 200 MG tablet Take 200 mg by mouth every 6 (six) hours as needed.        Marland Kitchen  levothyroxine (SYNTHROID, LEVOTHROID) 50 MCG tablet Take 1 tablet (50 mcg total) by mouth daily.  90 tablet  1  . omeprazole (PRILOSEC OTC) 20 MG tablet Take 20 mg by mouth daily.          Allergies  Allergen Reactions  . Augmentin (Amoxicillin-Pot Clavulanate)     Abdominal pain   ROS:  Denies fevers, cough, chest pain, headaches, dizziness, nausea, vomiting, diarrhea, skin concerns.  Moods are good.  Overall, feeling much better.  See HPI  PHYSICAL EXAM: BP 120/72  Pulse 72  Ht 5\' 8"  (1.727 m)  Wt 284 lb (128.822 kg)  BMI 43.18 kg/m2 Well developed, pleasant, obese female in no distress Neck: no lymphadenopathy or thyromegaly Heart: regular rate and rhythm Lungs: Occasional wheeze, cleared with deep breaths, fair air movement Abdomen: obese, soft nontender Extremities: 1+ edema bilaterally.  Chronic changes of  venous insufficiency with hyperpigmentation.  Dry flaky skin, but no crusting or drainage, no warmth. +pulses Psych: normal mood, affect, hygiene and grooming Neuro: alert and oriented, normal gait, strength, cranial nerves grossly intact.  Lab Results  Component Value Date   HGBA1C 5.6 09/13/2011    ASSESSMENT/PLAN: 1. Type II or unspecified type diabetes mellitus without mention of complication, not stated as uncontrolled  POCT HgB A1C, Comprehensive metabolic panel, Microalbumin / creatinine urine ratio  2. Essential hypertension, benign  amLODipine (NORVASC) 5 MG tablet, Comprehensive metabolic panel  3. Obesity, Class III, BMI 40-49.9 (morbid obesity)    4. Tobacco use disorder    5. Unspecified hypothyroidism  levothyroxine (SYNTHROID, LEVOTHROID) 50 MCG tablet, TSH  6. Pure hypercholesterolemia  Lipid panel   DM--diet controlled, much improved since weight loss.   HTN--improved control with addition of amlodipine.  Continue amlodipine and lotensin HCT. Cut lasix tablet to 1/2 tablet daily, with hopes of eventually decreasing to only using as needed for swelling.   If BP's continue to drop as she continues to lose weight, then next step would be to cut back the Lotensin HCT dose to 1 tablet daily. Hypothyroidism--euthyroid by history. Recheck TSH in 3 months given weight loss (has been intentional but dose may need to be adjusted) Tobacco use--strongly encouraged cessation.  3 months--due for lipids, TSH, c-met, urine microalbumin To have labs done prior to OV

## 2011-09-13 NOTE — Patient Instructions (Signed)
Cut lasix tablet to 1/2 tablet daily, with hopes of eventually decreasing to only using as needed for swelling.  Continue to follow low sodium diet.  Continue daily exercise and weight loss :)  Try and work on cutting back and QUITTING smoking--you will feel even better!!

## 2011-09-28 ENCOUNTER — Other Ambulatory Visit: Payer: Self-pay | Admitting: Family Medicine

## 2011-09-28 DIAGNOSIS — I10 Essential (primary) hypertension: Secondary | ICD-10-CM

## 2011-09-28 NOTE — Telephone Encounter (Signed)
Is this ok?

## 2011-09-28 NOTE — Telephone Encounter (Signed)
She was rx'd #[phone] with 1 refill on 3/21 (this is pharmacy requesting refill of initial rx, but has been sent new rx already)

## 2011-09-28 NOTE — Telephone Encounter (Signed)
called pharmacy they had not received refill

## 2011-09-28 NOTE — Telephone Encounter (Signed)
So this should NOT need to be refilled

## 2011-11-24 ENCOUNTER — Other Ambulatory Visit: Payer: Self-pay | Admitting: Family Medicine

## 2011-12-07 ENCOUNTER — Other Ambulatory Visit: Payer: PRIVATE HEALTH INSURANCE

## 2011-12-07 DIAGNOSIS — E7800 Pure hypercholesterolemia, unspecified: Secondary | ICD-10-CM

## 2011-12-07 DIAGNOSIS — I10 Essential (primary) hypertension: Secondary | ICD-10-CM

## 2011-12-07 DIAGNOSIS — E119 Type 2 diabetes mellitus without complications: Secondary | ICD-10-CM

## 2011-12-07 DIAGNOSIS — E039 Hypothyroidism, unspecified: Secondary | ICD-10-CM

## 2011-12-07 LAB — COMPREHENSIVE METABOLIC PANEL
Albumin: 4.1 g/dL (ref 3.5–5.2)
Alkaline Phosphatase: 138 U/L — ABNORMAL HIGH (ref 39–117)
CO2: 26 mEq/L (ref 19–32)
Chloride: 104 mEq/L (ref 96–112)
Glucose, Bld: 89 mg/dL (ref 70–99)
Potassium: 4.2 mEq/L (ref 3.5–5.3)
Sodium: 138 mEq/L (ref 135–145)
Total Protein: 7.1 g/dL (ref 6.0–8.3)

## 2011-12-07 LAB — LIPID PANEL
Cholesterol: 119 mg/dL (ref 0–200)
VLDL: 14 mg/dL (ref 0–40)

## 2011-12-13 ENCOUNTER — Encounter: Payer: Self-pay | Admitting: Family Medicine

## 2011-12-13 ENCOUNTER — Ambulatory Visit (INDEPENDENT_AMBULATORY_CARE_PROVIDER_SITE_OTHER): Payer: PRIVATE HEALTH INSURANCE | Admitting: Family Medicine

## 2011-12-13 VITALS — BP 130/64 | HR 80 | Ht 68.0 in | Wt 253.0 lb

## 2011-12-13 DIAGNOSIS — I10 Essential (primary) hypertension: Secondary | ICD-10-CM

## 2011-12-13 DIAGNOSIS — F17200 Nicotine dependence, unspecified, uncomplicated: Secondary | ICD-10-CM

## 2011-12-13 DIAGNOSIS — R809 Proteinuria, unspecified: Secondary | ICD-10-CM

## 2011-12-13 DIAGNOSIS — E669 Obesity, unspecified: Secondary | ICD-10-CM

## 2011-12-13 DIAGNOSIS — E119 Type 2 diabetes mellitus without complications: Secondary | ICD-10-CM

## 2011-12-13 DIAGNOSIS — E7800 Pure hypercholesterolemia, unspecified: Secondary | ICD-10-CM

## 2011-12-13 NOTE — Patient Instructions (Addendum)
Continue your current medications.  If your blood pressure drops to consistently <110 with frequent <100, especially if accompanied by dizziness, then cut the amlodipine back in 1/2.   Keep up the great work with your exercise and weight loss.  Don't forget to try and cut back on your cigarette smoking, although it is okay to hold off on completely quitting until you've reached your weight goal.

## 2011-12-13 NOTE — Progress Notes (Signed)
Chief Complaint  Patient presents with  . Med check    med check, labs done 12/07/11.   HPI: HTN:  BP's have been running 115-125/60's.  Denies dizziness.  Has some lower BP's (only once <100), and occasional upper 130's.  Exercising 45 minutes 3x/day every day on her exercise bike.  She continues on Nutrasystem, and continues to lose weight.  She feels great, better than she ever has before.  Still smoking 1/2 PPD, plans to work on quitting smoking once she gets down to 200 pounds.  She stopped taking Lasix in April.  Denies increase in edema since stopping.  Went off Prilosec in March, with no recurrent reflux or problems.  Hypothyroidism--compliant with medications.  Denies thyroid-related symptoms  Past Medical History  Diagnosis Date  . Obesity   . Hypertension   . Hyperlipidemia   . Diabetes mellitus     never on meds, A1c 7.0  . Unspecified hypothyroidism   . Venous stasis of lower extremity   . Diastolic dysfunction   . Tobacco dependence    Past Surgical History  Procedure Date  . Cholecystectomy 1980  . Appendectomy    History   Social History  . Marital Status: Married    Spouse Name: N/A    Number of Children: 0  . Years of Education: N/A   Occupational History  . Retired (Presenter, broadcasting) [employer]   Social History Main Topics  . Smoking status: Current Everyday Smoker -- 0.5 packs/day for 46 years    Types: Cigarettes  . Smokeless tobacco: Never Used   Comment: 1 ppd since age 68; quit x 7 months with Chantix in past  . Alcohol Use: Yes     maybe 1 drink per week.  . Drug Use: No  . Sexually Active: Not Currently   Other Topics Concern  . Not on file   Social History Narrative   Cares for her elderly mother who lives next door   Current Outpatient Prescriptions on File Prior to Visit  Medication Sig Dispense Refill  . amLODipine (NORVASC) 5 MG tablet Take 1 tablet (5 mg total) by mouth daily.  90 tablet  1  . aspirin 81 MG tablet Take 81  mg by mouth daily.        Marland Kitchen atorvastatin (LIPITOR) 40 MG tablet TAKE 1 TABLET BY MOUTH EVERY DAY  90 tablet  0  . benazepril-hydrochlorthiazide (LOTENSIN HCT) 20-12.5 MG per tablet TAKE 2 TABLETS BY MOUTH EVERY DAY  180 tablet  1  . Cholecalciferol (VITAMIN D) 1000 UNITS capsule Take 2,000 Units by mouth daily.       Marland Kitchen ibuprofen (ADVIL,MOTRIN) 200 MG tablet Take 200 mg by mouth every 6 (six) hours as needed.        Marland Kitchen levothyroxine (SYNTHROID, LEVOTHROID) 50 MCG tablet Take 1 tablet (50 mcg total) by mouth daily.  90 tablet  1  . furosemide (LASIX) 40 MG tablet Take 40 mg by mouth daily.        Marland Kitchen omeprazole (PRILOSEC OTC) 20 MG tablet Take 20 mg by mouth daily.        Marland Kitchen DISCONTD: amLODipine (NORVASC) 5 MG tablet START WITH 1/2 TAB AND INCREASE AFTER A WEEK TO FULL TABLET IF BP IS STILL > 140/90 AS DIRECTED  30 tablet  1   Allergies  Allergen Reactions  . Augmentin (Amoxicillin-Pot Clavulanate)     Abdominal pain   ROS:  Denies fevers, URI symptoms, chest pain, shortness of breath, edema, skin  lesions/sores.  Denies GI complaints, fatigue, hair/skin/bowel changes.  Denies fatigue  PHYSICAL EXAM: BP 130/64  Pulse 80  Ht 5\' 8"  (1.727 m)  Wt 253 lb (114.76 kg)  BMI 38.47 kg/m2 Well developed, pleasant female in no distress Neck: no lymphadenopathy, thyromegaly or mass Heart: regular rate and rhythm Lungs: clear bilaterally Abdomen: obese, soft, nontender, no mass Extremities: Venous stasis changes bilaterally with hyperpigmentation and trace edema Skin: as above. No lesions/sores Psych: normal mood, affect, hygiene and grooming Neuro: alert and oriented.  Normal strength, gait. Cranial nerves grossly intact.  Lab Results  Component Value Date   TSH 1.544 12/07/2011   Lab Results  Component Value Date   CHOL 119 12/07/2011   HDL 45 12/07/2011   LDLCALC 60 12/07/2011   TRIG 71 12/07/2011   CHOLHDL 2.6 12/07/2011     Chemistry      Component Value Date/Time   NA 138 12/07/2011 0929     K 4.2 12/07/2011 0929   CL 104 12/07/2011 0929   CO2 26 12/07/2011 0929   BUN 20 12/07/2011 0929   CREATININE 0.76 12/07/2011 0929      Component Value Date/Time   CALCIUM 9.6 12/07/2011 0929   ALKPHOS 138* 12/07/2011 0929   AST 18 12/07/2011 0929   ALT 15 12/07/2011 0929   BILITOT 0.7 12/07/2011 0929     ASSESSMENT/PLAN: 1. Type II or unspecified type diabetes mellitus without mention of complication, not stated as uncontrolled  Microalbumin / creatinine urine ratio  2. Microalbuminuria  Microalbumin / creatinine urine ratio  3. Tobacco use disorder    4. Essential hypertension, benign    5. Pure hypercholesterolemia    6. Obesity (BMI 30-39.9)     HTN--well controlled.  Plan to continue current medications.  If with further weight loss BP drops consistently <100-110 and/or has symptoms, can decrease amlodipine back to 1/2 tablet, but recommended to continue the full 5mg  dose now. Hyperlipidemia--well controlled Obesity--has lost 100 pounds in 5 months.  Continue exercise, diet  Quit smoking at 200#, and then at least a month later, work on weaning off Nutrasystem to regular diet.  Counseled regarding smoking cessation  DM--A1c back to normal at last visit, and normal fasting glucose on recent labs.  H/o elevated microalbumin--recheck today.  Hypothyroidism--controlled with current dose.  Reviewed signs/symptoms of overactive thyroid, and to return sooner than scheduled next appt if develops symptoms.  3 months--f/u weight, blood pressure.  She prefers 3 month f/u to keep her accountable for her weight loss.  25-30 min visit, more than 1/2 spent counseling

## 2011-12-14 LAB — MICROALBUMIN / CREATININE URINE RATIO: Creatinine, Urine: 59.5 mg/dL

## 2011-12-18 ENCOUNTER — Observation Stay (HOSPITAL_BASED_OUTPATIENT_CLINIC_OR_DEPARTMENT_OTHER)
Admission: EM | Admit: 2011-12-18 | Discharge: 2011-12-19 | DRG: 313 | Disposition: A | Discharge status: Home / Self Care | Payer: PRIVATE HEALTH INSURANCE | Attending: Cardiovascular Disease | Admitting: Cardiovascular Disease

## 2011-12-18 ENCOUNTER — Emergency Department (HOSPITAL_BASED_OUTPATIENT_CLINIC_OR_DEPARTMENT_OTHER): Payer: PRIVATE HEALTH INSURANCE

## 2011-12-18 ENCOUNTER — Encounter (HOSPITAL_BASED_OUTPATIENT_CLINIC_OR_DEPARTMENT_OTHER): Payer: Self-pay | Admitting: *Deleted

## 2011-12-18 DIAGNOSIS — I10 Essential (primary) hypertension: Secondary | ICD-10-CM | POA: Diagnosis present

## 2011-12-18 DIAGNOSIS — E039 Hypothyroidism, unspecified: Secondary | ICD-10-CM

## 2011-12-18 DIAGNOSIS — Z79899 Other long term (current) drug therapy: Secondary | ICD-10-CM

## 2011-12-18 DIAGNOSIS — E119 Type 2 diabetes mellitus without complications: Secondary | ICD-10-CM | POA: Diagnosis present

## 2011-12-18 DIAGNOSIS — F17200 Nicotine dependence, unspecified, uncomplicated: Secondary | ICD-10-CM | POA: Diagnosis present

## 2011-12-18 DIAGNOSIS — Z7982 Long term (current) use of aspirin: Secondary | ICD-10-CM

## 2011-12-18 DIAGNOSIS — R0789 Other chest pain: Principal | ICD-10-CM | POA: Diagnosis present

## 2011-12-18 DIAGNOSIS — I200 Unstable angina: Secondary | ICD-10-CM

## 2011-12-18 DIAGNOSIS — R079 Chest pain, unspecified: Secondary | ICD-10-CM

## 2011-12-18 DIAGNOSIS — Z6838 Body mass index (BMI) 38.0-38.9, adult: Secondary | ICD-10-CM

## 2011-12-18 DIAGNOSIS — I440 Atrioventricular block, first degree: Secondary | ICD-10-CM | POA: Insufficient documentation

## 2011-12-18 DIAGNOSIS — E785 Hyperlipidemia, unspecified: Secondary | ICD-10-CM | POA: Diagnosis present

## 2011-12-18 DIAGNOSIS — I872 Venous insufficiency (chronic) (peripheral): Secondary | ICD-10-CM | POA: Diagnosis present

## 2011-12-18 DIAGNOSIS — R0602 Shortness of breath: Secondary | ICD-10-CM | POA: Insufficient documentation

## 2011-12-18 DIAGNOSIS — E669 Obesity, unspecified: Secondary | ICD-10-CM | POA: Diagnosis present

## 2011-12-18 LAB — CBC
HCT: 36.3 % (ref 36.0–46.0)
Hemoglobin: 12.5 g/dL (ref 12.0–15.0)
MCH: 32.2 pg (ref 26.0–34.0)
MCHC: 34.4 g/dL (ref 30.0–36.0)
MCV: 93.6 fL (ref 78.0–100.0)
RBC: 3.88 MIL/uL (ref 3.87–5.11)

## 2011-12-18 LAB — DIFFERENTIAL
Eosinophils Absolute: 0.2 10*3/uL (ref 0.0–0.7)
Eosinophils Relative: 2 % (ref 0–5)
Lymphs Abs: 1.5 10*3/uL (ref 0.7–4.0)
Monocytes Absolute: 1.1 10*3/uL — ABNORMAL HIGH (ref 0.1–1.0)
Monocytes Relative: 11 % (ref 3–12)

## 2011-12-18 LAB — COMPREHENSIVE METABOLIC PANEL
Alkaline Phosphatase: 132 U/L — ABNORMAL HIGH (ref 39–117)
BUN: 26 mg/dL — ABNORMAL HIGH (ref 6–23)
Calcium: 10 mg/dL (ref 8.4–10.5)
Creatinine, Ser: 0.7 mg/dL (ref 0.50–1.10)
GFR calc Af Amer: >90 mL/min (ref >90)
Glucose, Bld: 97 mg/dL (ref 70–99)
Potassium: 3.9 mEq/L (ref 3.5–5.1)
Total Protein: 8.2 g/dL (ref 6.0–8.3)

## 2011-12-18 LAB — PROTIME-INR: INR: 1.02 (ref 0.00–1.49)

## 2011-12-18 LAB — D-DIMER, QUANTITATIVE: D-Dimer, Quant: 1.97 ug/mL-FEU — ABNORMAL HIGH (ref 0.00–0.48)

## 2011-12-18 LAB — TROPONIN I: Troponin I: <0.3 ng/mL (ref <0.30)

## 2011-12-18 MED ORDER — HEPARIN BOLUS VIA INFUSION
4000.0000 [IU] | Freq: Once | INTRAVENOUS | Status: AC
  Administered 2011-12-18: 4000 [IU] via INTRAVENOUS

## 2011-12-18 MED ORDER — IOHEXOL 350 MG/ML SOLN
80.0000 mL | Freq: Once | INTRAVENOUS | Status: AC | PRN
  Administered 2011-12-18: 80 mL via INTRAVENOUS

## 2011-12-18 MED ORDER — ASPIRIN 81 MG PO CHEW
CHEWABLE_TABLET | ORAL | Status: AC
  Administered 2011-12-18: 324 mg
  Filled 2011-12-18: qty 4

## 2011-12-18 MED ORDER — HEPARIN (PORCINE) IN NACL 100-0.45 UNIT/ML-% IJ SOLN
1300.0000 [IU]/h | INTRAMUSCULAR | Status: DC
  Administered 2011-12-18: 1000 [IU]/h via INTRAVENOUS
  Administered 2011-12-19: 1300 [IU]/h via INTRAVENOUS
  Filled 2011-12-18: qty 250

## 2011-12-18 NOTE — ED Notes (Signed)
Pain level now 1-2/10 B/P121/56

## 2011-12-18 NOTE — ED Provider Notes (Signed)
History     CSN: [id number]  Arrival date & time 12/18/11  1947   First MD Initiated Contact with Patient 12/18/11 2022      Chief Complaint  Patient presents with  . Chest Pain    (Consider location/radiation/quality/duration/timing/severity/associated sxs/prior treatment) Patient is a 65 y.o. female presenting with chest pain. The history is provided by the patient.  Chest Pain The chest pain began 3 - 5 hours ago. Chest pain occurs constantly. The chest pain is worsening. Associated with: nothing. At its most intense, the pain is at 8/10. The pain is currently at 8/10. The severity of the pain is severe. The quality of the pain is described as sharp, pressure-like, squeezing and pleuritic. The pain radiates to the left neck and right neck. Chest pain is worsened by deep breathing (no changed by eating). Primary symptoms include shortness of breath. Pertinent negatives for primary symptoms include no cough, no nausea and no vomiting. She tried aspirin for the symptoms. Risk factors include smoking/tobacco exposure.  Her past medical history is significant for hyperlipidemia and hypertension.  Procedure history is negative for cardiac catheterization, stress echo and exercise treadmill test.     Past Medical History  Diagnosis Date  . Obesity   . Hypertension   . Hyperlipidemia   . Diabetes mellitus     never on meds, A1c 7.0  . Unspecified hypothyroidism   . Venous stasis of lower extremity   . Diastolic dysfunction   . Tobacco dependence     Past Surgical History  Procedure Date  . Cholecystectomy 1980  . Appendectomy     Family History  Problem Relation Age of Onset  . Cancer Mother     breast cancer (70), colon cancer  . Diabetes Mother   . Hypertension Mother   . Diabetes Father     History  Substance Use Topics  . Smoking status: Current Everyday Smoker -- 0.5 packs/day for 46 years    Types: Cigarettes  . Smokeless tobacco: Never Used   Comment: 1 ppd  since age 48; quit x 7 months with Chantix in past  . Alcohol Use: Yes     maybe 1 drink per week.    OB History    Grav Para Term Preterm Abortions TAB SAB Ect Mult Living                  Review of Systems  Respiratory: Positive for shortness of breath. Negative for cough.   Cardiovascular: Positive for chest pain.  Gastrointestinal: Negative for nausea and vomiting.  All other systems reviewed and are negative.    Allergies  Augmentin  Home Medications   Current Outpatient Rx  Name Route Sig Dispense Refill  . AMLODIPINE BESYLATE 5 MG PO TABS Oral Take 1 tablet (5 mg total) by mouth daily. 90 tablet 1  . ASPIRIN 81 MG PO TABS Oral Take 81 mg by mouth daily.      . ATORVASTATIN CALCIUM 40 MG PO TABS  TAKE 1 TABLET BY MOUTH EVERY DAY 90 tablet 0  . BENAZEPRIL-HYDROCHLOROTHIAZIDE 20-12.5 MG PO TABS  TAKE 2 TABLETS BY MOUTH EVERY DAY 180 tablet 1  . VITAMIN D 1000 UNITS PO CAPS Oral Take 2,000 Units by mouth daily.     . FUROSEMIDE 40 MG PO TABS Oral Take 40 mg by mouth daily.      . IBUPROFEN 200 MG PO TABS Oral Take 200 mg by mouth every 6 (six) hours as needed.      Marland Kitchen  LEVOTHYROXINE SODIUM 50 MCG PO TABS Oral Take 1 tablet (50 mcg total) by mouth daily. 90 tablet 1  . OMEPRAZOLE MAGNESIUM 20 MG PO TBEC Oral Take 20 mg by mouth daily.        BP 151/75  Pulse 95  Temp 97.5 F (36.4 C) (Oral)  Resp 20  SpO2 97%  Physical Exam  Nursing note and vitals reviewed. Constitutional: She is oriented to person, place, and time. She appears well-developed and well-nourished. No distress.  HENT:  Head: Normocephalic and atraumatic.  Mouth/Throat: Oropharynx is clear and moist.  Eyes: Conjunctivae and EOM are normal. Pupils are equal, round, and reactive to light.  Neck: Normal range of motion. Neck supple.  Cardiovascular: Normal rate, regular rhythm and intact distal pulses.   No murmur heard. Pulmonary/Chest: Effort normal and breath sounds normal. No respiratory distress.  She has no wheezes. She has no rales. She exhibits tenderness.    Abdominal: Soft. She exhibits no distension. There is no tenderness. There is no rebound and no guarding.  Musculoskeletal: Normal range of motion. She exhibits no edema and no tenderness.  Neurological: She is alert and oriented to person, place, and time.  Skin: Skin is warm and dry. No rash noted. No erythema.  Psychiatric: She has a normal mood and affect. Her behavior is normal.    ED Course  Procedures (including critical care time)  Labs Reviewed  CBC - Abnormal; Notable for the following:    Platelets 145 (*)     All other components within normal limits  DIFFERENTIAL - Abnormal; Notable for the following:    Monocytes Absolute 1.1 (*)     All other components within normal limits  COMPREHENSIVE METABOLIC PANEL - Abnormal; Notable for the following:    BUN 26 (*)     Alkaline Phosphatase 132 (*)     GFR calc non Af Amer 90 (*)     All other components within normal limits  D-DIMER, QUANTITATIVE - Abnormal; Notable for the following:    D-Dimer, Quant 1.97 (*)     All other components within normal limits  TROPONIN I  PROTIME-INR  APTT   Ct Angio Chest W/cm &/or Wo Cm  12/18/2011  *RADIOLOGY REPORT*  Clinical Data: Chest pain, shortness of breath, and swollen feet.  CT ANGIOGRAPHY CHEST  Technique:  Multidetector CT imaging of the chest using the standard protocol during bolus administration of intravenous contrast. Multiplanar reconstructed images including MIPs were obtained and reviewed to evaluate the vascular anatomy.  Contrast: 80mL OMNIPAQUE IOHEXOL 350 MG/ML SOLN  Comparison: 06/28/2004  Findings: Technically adequate study with moderately good opacification of the central and segmental pulmonary arteries.  No focal filling defects are demonstrated.  No evidence of significant pulmonary embolus.  Mild cardiac enlargement.  Normal caliber thoracic aorta with calcification.  Calcifications scattered in the  coronary arteries.  No significant lymphadenopathy in the chest. The esophagus is decompressed.  Visualized portions of the upper abdominal organs are grossly unremarkable.  Visualization of the lung fields is limited due to respiratory motion artifact but no focal airspace consolidation or interstitial changes are appreciated.  No pneumothorax.  No pleural effusions. Slight fibrosis or linear atelectasis in the lung bases. Degenerative changes in the thoracic spine.  IMPRESSION: No evidence of significant pulmonary embolus.  Original Report Authenticated By: Marlon Pel, M.D.   Dg Chest Port 1 View  12/18/2011  *RADIOLOGY REPORT*  Clinical Data: Chest pain, shortness of breath  PORTABLE CHEST - 1  VIEW  Comparison: 01/26/2008; 07/01/2005; 04/05/2005  Findings: Unchanged enlarged cardiac silhouette and mediastinal contours.  The pulmonary vasculature is indistinct with cephalization of flow.  No definite pleural effusion or pneumothorax.  Minimal bibasilar heterogeneous opacities. Unchanged bones.  IMPRESSION:   Enlarged cardiac silhouette with findings suggestive of pulmonary edema.  Original Report Authenticated By: Waynard Reeds, M.D.    Date: 12/18/2011  Rate: 94  Rhythm: normal sinus rhythm withPVC  QRS Axis: right  Intervals: normal  ST/T Wave abnormalities: nonspecific ST changes  Conduction Disutrbances:nonspecific intraventricular conduction delay  Narrative Interpretation:   Old EKG Reviewed: changes noted   CRITICAL CARE Performed by: Gwyneth Sprout   Total critical care time: 30  Critical care time was exclusive of separately billable procedures and treating other patients.  Critical care was necessary to treat or prevent imminent or life-threatening deterioration.  Critical care was time spent personally by me on the following activities: development of treatment plan with patient and/or surrogate as well as nursing, discussions with consultants, evaluation of  patient's response to treatment, examination of patient, obtaining history from patient or surrogate, ordering and performing treatments and interventions, ordering and review of laboratory studies, ordering and review of radiographic studies, pulse oximetry and re-evaluation of patient's condition.  1. Unstable angina       MDM   Pt with symptoms concerning for ACS.  TIMI 2 for risk factors and daily ASA . Associated symptoms include SOB.  Low risk well's criteria. ASA and NTG given which resolved the pain. EKG with new non-specific intraventricular block but no signs of ST elevation or depression, CXR with cardiomegaly, CBC, BMP, CE, Coags all wnl.  D-dimer elevated but CT neg.  Pt started on heparin gtt and admitted to cards for further work up.         Gwyneth Sprout, MD 12/18/11 2320

## 2011-12-18 NOTE — ED Notes (Signed)
B/p 120/65  Rate 90  Pain level  4/10  SL ntg 0.4 given

## 2011-12-18 NOTE — ED Notes (Signed)
SL  ntg 0.4  Given  B/p145/65 rate 96  Given at 2020  Pain level 6/10

## 2011-12-18 NOTE — ED Notes (Signed)
Chest pain this evening in the center of her chest. It felt like a pulled muscle. It never went away but got worse as the day has progressed. She feels sob.

## 2011-12-19 DIAGNOSIS — R079 Chest pain, unspecified: Secondary | ICD-10-CM

## 2011-12-19 LAB — DIFFERENTIAL
Basophils Relative: 0 % (ref 0–1)
Eosinophils Absolute: 0 10*3/uL (ref 0.0–0.7)
Eosinophils Relative: 0 % (ref 0–5)
Lymphs Abs: 0.9 10*3/uL (ref 0.7–4.0)
Monocytes Relative: 11 % (ref 3–12)
Neutrophils Relative %: 81 % — ABNORMAL HIGH (ref 43–77)

## 2011-12-19 LAB — COMPREHENSIVE METABOLIC PANEL
Albumin: 3.6 g/dL (ref 3.5–5.2)
BUN: 21 mg/dL (ref 6–23)
Calcium: 9.2 mg/dL (ref 8.4–10.5)
Creatinine, Ser: 0.7 mg/dL (ref 0.50–1.10)
GFR calc Af Amer: >90 mL/min (ref >90)
Glucose, Bld: 117 mg/dL — ABNORMAL HIGH (ref 70–99)
Potassium: 4.1 mEq/L (ref 3.5–5.1)
Total Protein: 7.1 g/dL (ref 6.0–8.3)

## 2011-12-19 LAB — CARDIAC PANEL(CRET KIN+CKTOT+MB+TROPI)
CK, MB: 1.8 ng/mL (ref 0.3–4.0)
CK, MB: 1.3 ng/mL (ref 0.3–4.0)
CK, MB: 1.5 ng/mL (ref 0.3–4.0)
Relative Index: INVALID (ref 0.0–2.5)
Relative Index: INVALID (ref 0.0–2.5)
Total CK: 34 U/L (ref 7–177)
Total CK: 26 U/L (ref 7–177)

## 2011-12-19 LAB — CBC
Hemoglobin: 11.4 g/dL — ABNORMAL LOW (ref 12.0–15.0)
MCH: 31.5 pg (ref 26.0–34.0)
MCHC: 33.5 g/dL (ref 30.0–36.0)
MCV: 93.9 fL (ref 78.0–100.0)
MCV: 93.6 fL (ref 78.0–100.0)
Platelets: 142 10*3/uL — ABNORMAL LOW (ref 150–400)
RBC: 3.62 MIL/uL — ABNORMAL LOW (ref 3.87–5.11)
RBC: 3.76 MIL/uL — ABNORMAL LOW (ref 3.87–5.11)
RDW: 13.3 % (ref 11.5–15.5)
WBC: 10.6 10*3/uL — ABNORMAL HIGH (ref 4.0–10.5)

## 2011-12-19 LAB — PRO B NATRIURETIC PEPTIDE: Pro B Natriuretic peptide (BNP): 502.5 pg/mL — ABNORMAL HIGH (ref 0–125)

## 2011-12-19 LAB — LIPID PANEL
Cholesterol: 112 mg/dL (ref 0–200)
HDL: 55 mg/dL (ref >39)
Total CHOL/HDL Ratio: 2 RATIO

## 2011-12-19 LAB — PROTIME-INR: Prothrombin Time: 14.3 seconds (ref 11.6–15.2)

## 2011-12-19 LAB — HEMOGLOBIN A1C
Hgb A1c MFr Bld: 5.6 % (ref <5.7)
Mean Plasma Glucose: 114 mg/dL (ref <117)

## 2011-12-19 MED ORDER — METOPROLOL TARTRATE 25 MG PO TABS
25.0000 mg | ORAL_TABLET | Freq: Two times a day (BID) | ORAL | Status: DC
  Administered 2011-12-19: 25 mg via ORAL
  Filled 2011-12-19: qty 1

## 2011-12-19 MED ORDER — BENAZEPRIL-HYDROCHLOROTHIAZIDE 20-12.5 MG PO TABS: 1.0000 | ORAL_TABLET | Freq: Every day | ORAL | Status: DC

## 2011-12-19 MED ORDER — LEVOTHYROXINE SODIUM 50 MCG PO TABS
50.0000 ug | ORAL_TABLET | Freq: Every day | ORAL | Status: DC
  Administered 2011-12-19: 50 ug via ORAL
  Filled 2011-12-19: qty 1

## 2011-12-19 MED ORDER — ASPIRIN EC 325 MG PO TBEC: 325.0000 mg | DELAYED_RELEASE_TABLET | Freq: Every day | ORAL | Status: DC

## 2011-12-19 MED ORDER — SODIUM CHLORIDE 0.9 % IV SOLN
INTRAVENOUS | Status: DC
  Administered 2011-12-19: 10 mL via INTRAVENOUS

## 2011-12-19 MED ORDER — NITROGLYCERIN 0.4 MG SL SUBL: 0.4000 mg | SUBLINGUAL_TABLET | SUBLINGUAL | Status: DC | PRN

## 2011-12-19 MED ORDER — ONDANSETRON HCL 4 MG/2ML IJ SOLN: 4.0000 mg | Freq: Four times a day (QID) | INTRAMUSCULAR | Status: DC | PRN

## 2011-12-19 MED ORDER — HYDROCHLOROTHIAZIDE 12.5 MG PO CAPS
12.5000 mg | ORAL_CAPSULE | Freq: Every day | ORAL | Status: DC
  Administered 2011-12-19: 12.5 mg via ORAL
  Filled 2011-12-19 (×2): qty 1

## 2011-12-19 MED ORDER — ACETAMINOPHEN 325 MG PO TABS: 650.0000 mg | ORAL_TABLET | ORAL | Status: DC | PRN

## 2011-12-19 MED ORDER — BENAZEPRIL HCL 20 MG PO TABS
20.0000 mg | ORAL_TABLET | Freq: Every day | ORAL | Status: DC
Start: 2011-12-19 — End: 2011-12-19
  Administered 2011-12-19: 20 mg via ORAL
  Filled 2011-12-19: qty 1

## 2011-12-19 MED ORDER — AMLODIPINE BESYLATE 5 MG PO TABS
5.0000 mg | ORAL_TABLET | Freq: Every day | ORAL | Status: DC
  Administered 2011-12-19: 5 mg via ORAL
  Filled 2011-12-19: qty 1

## 2011-12-19 MED ORDER — ATORVASTATIN CALCIUM 40 MG PO TABS
40.0000 mg | ORAL_TABLET | Freq: Every day | ORAL | Status: DC
  Filled 2011-12-19: qty 1

## 2011-12-19 MED FILL — Morphine Sulfate Inj 10 MG/ML: INTRAMUSCULAR | Qty: 1 | Status: AC

## 2011-12-19 NOTE — Discharge Summary (Signed)
Discharge Summary   Patient ID: Nancy West MRN: [id number], DOB/AGE: 1945-11-29 66 y.o.  Primary MD: Carollee Herter, MD Primary Cardiologist: Kristeen Miss MD Admit date: 12/18/2011 D/C date:     12/19/2011      Primary Discharge Diagnoses:  1. Chest pain  - No objective evidence of ischemia  - CTA negative for PE  - Outpatient Lexiscan Myoview   Secondary Discharge Diagnoses:  1. Hypertension 2. Hyperlipidemia  3. Diabetes mellitus never on meds, A1c 7.0 4. Unspecified hypothyroidism  5. Venous stasis of lower extremity  6. Diastolic dysfunction  7. Morbid Obesity 8. Tobacco dependence    Allergies Allergies  Allergen Reactions  . Augmentin (Amoxicillin-Pot Clavulanate)     Abdominal pain    Diagnostic Studies/Procedures:   12/18/2011 - Ct Angio Chest W/cm &/or Wo Cm Findings: Technically adequate study with moderately good opacification of the central and segmental pulmonary arteries.  No focal filling defects are demonstrated.  No evidence of significant pulmonary embolus.  Mild cardiac enlargement.  Normal caliber thoracic aorta with calcification.  Calcifications scattered in the coronary arteries.  No significant lymphadenopathy in the chest. The esophagus is decompressed.  Visualized portions of the upper abdominal organs are grossly unremarkable.  Visualization of the lung fields is limited due to respiratory motion artifact but no focal airspace consolidation or interstitial changes are appreciated.  No pneumothorax.  No pleural effusions. Slight fibrosis or linear atelectasis in the lung bases. Degenerative changes in the thoracic spine.  IMPRESSION: No evidence of significant pulmonary embolus.     History of Present Illness: 66 y.o. female w/ the above medical problems who presented to [facility] on 12/18/11 with complaints of chest pain.  She was in her usual state of health when she developed central chest pain that radiated to her back.  It started around 130pm on the day of presentation and lasted until 730 pm when she sought medical care.  Hospital Course: At the OSH, she was given ASA, NTG, IV heparin and tx to [facility]. EKG revealed SR w/ 1st degree AV block, subtle dynamic changes in the lateral leads. CXR showed enlarged cardiac silhouette with findings suggestive of pulmonary edema. Labs were significant for elevated DDimer, normal troponin, and unremarkable CBC/CMET. CTA chest did not show effusions, infiltrates, or PE. She had no evidence of volume overload on exam. Her symptoms were concerning for unstable angina so she was admitted for further evaluation and treatment.   Cardiac enzymes were cycled and remained negative. She was able to ambulate without complaints of chest pain or sob. As there was no objective evidence of ischemia and she was chest pain free it was felt she could safely be discharged home with plans for outpatient lexiscan myoview. The patient was agreeable to this plan and was provided with a prescription for SL NTG.  She was seen and evaluated by Dr. Elease Hashimoto who felt she was stable for discharge home with plans for follow up as scheduled below.  Discharge Vitals: Blood pressure 108/46, pulse 82, temperature 98.2 F (36.8 C), temperature source Oral, resp. rate 13, height 5\' 8"  (1.727 m), weight 255 lb 11.7 oz (116 kg), SpO2 98.00%.  Labs: Component Value Date   WBC 10.6* 12/19/2011   HGB 12.0 12/19/2011   HCT 35.2* 12/19/2011   MCV 93.6 12/19/2011   PLT 142* 12/19/2011    Lab 12/19/11 0256  NA 136  K 4.1  CL 100  CO2 26  BUN 21  CREATININE 0.70  CALCIUM 9.2  PROT 7.1  BILITOT 0.7  ALKPHOS 110  ALT 12  AST 15  GLUCOSE 117*   Basename 12/19/11 0840 12/19/11 0256 12/18/11 2011  CKTOTAL 32 34 --  CKMB 1.3 1.8 --  TROPONINI <0.30 <0.30 <0.30   Component Value Date   CHOL 112 12/19/2011   HDL 55 12/19/2011   LDLCALC 49 12/19/2011   TRIG 42 12/19/2011   Component Value Date   DDIMER 1.97*  12/18/2011     12/19/2011 02:56  Pro B Natriuretic peptide (BNP) 502.5 (H)    Discharge Medications   Medication List  As of 12/19/2011  3:17 PM   TAKE these medications         amLODipine 5 MG tablet   Commonly known as: NORVASC   Take 1 tablet (5 mg total) by mouth daily.      aspirin 81 MG tablet   Take 81 mg by mouth daily.      atorvastatin 40 MG tablet   Commonly known as: LIPITOR   TAKE 1 TABLET BY MOUTH EVERY DAY      benazepril-hydrochlorthiazide 20-12.5 MG per tablet   Commonly known as: LOTENSIN HCT   TAKE 2 TABLETS BY MOUTH EVERY DAY      ibuprofen 200 MG tablet   Commonly known as: ADVIL,MOTRIN   Take 200 mg by mouth every 6 (six) hours as needed. Patient used this medication for her chest pain.      levothyroxine 50 MCG tablet   Commonly known as: SYNTHROID, LEVOTHROID   Take 1 tablet (50 mcg total) by mouth daily.      nitroGLYCERIN 0.4 MG SL tablet   Commonly known as: NITROSTAT   Place 1 tablet (0.4 mg total) under the tongue every 5 (five) minutes as needed for chest pain (up to 3 doses).      Vitamin D 1000 UNITS capsule   Take 2,000 Units by mouth daily.            Disposition   Discharge Orders    Future Appointments: Provider: Department: Dept Phone: Center:   12/24/2011 12:30 PM [facility]  None   01/04/2012 9:00 AM Rosalio Macadamia, NP [facility] [phone] None   03/19/2012 1:30 PM Joselyn Arrow, MD [facility] [phone] [facility]     Future Orders Please Complete By Expires   Diet - low sodium heart healthy      Increase activity slowly      Discharge instructions      Comments:   **PLEASE REMEMBER TO BRING ALL OF YOUR MEDICATIONS TO EACH OF YOUR FOLLOW-UP OFFICE VISITS.    You have a Stress Test scheduled on 12/24/11 @ 12:30 at [facility] in [place]. Your doctor has ordered this test to get a better idea of how your heart works.  Please arrive 15 minutes early for  paperwork.   Location: [address], [address] [place], [place] [address] [phone]  Instructions: No food/drink after midnight the night before.  No caffeine/decaf products 24 hours before, including medicines such as Excedrin or Goody Powders. Call if there are any questions.  Wear comfortable clothes and shoes.  It is OK to take your morning meds with a sip of water EXCEPT for those types of medicines listed below or otherwise instructed.  Special Medication Instructions: Beta blockers such as metoprolol (Lopressor/Toprol XL), atenolol (Tenormin), carvedilol (Coreg), nebivolol (Bystolic), propranolol (Inderal) should not be taken for 24 hours before  the test. Calcium channel blockers such as diltiazem (Cardizem) or verapmil (Calan) should not be taken for 24 hours before the test. Remove nitroglycerin patches and do not take nitrate preparations such as Imdur/isosorbide the day of your test. No Persantine/Theophylline or Aggrenox medicines should be used within 24 hours of the test.   What To Expect: This test will be done in two parts over two days. When you arrive in the lab, the technician will inject a small amount of radioactive tracer into your arm through an IV while you are resting quietly. This helps [place] to form pictures of your heart. You will likely only feel a sting from the IV. After a waiting period, resting pictures will be obtained under a big camera. These are the "before" pictures.  Next, you will be prepped for the stress portion of the test. This may include either walking on a treadmill or receiving a medicine that helps to dilate blood vessels in your heart to simulate the effect of exercise on your heart. If you are walking on a treadmill, you will walk at different paces to try to get your heart rate to a goal number that is based on your age. If your doctor has chosen the pharmacologic test, then you will receive a medicine through your IV that may cause temporary  nausea, flushing, shortness of breath and sometimes chest discomfort or vomiting. This is typically short-lived and usually resolves quickly. Your blood pressure and heart rate will be monitored, and we will be watching your EKG on a computer screen for any changes. During this portion of the test, the radiologist will inject another small amount of radioactive tracer into your IV. After a waiting period, you will undergo a second set of pictures. These are the "after" pictures.  The doctor reading the test will compare the before-and-after images to look for evidence of heart blockages or heart weakness.     Follow-up Information    Follow up with [facility] on 12/24/2011. (Stress test at 12:30 (Please arrive at 12:15))    Contact information:   [facility] [address] [place] [address] [phone]      Follow up with Norma Fredrickson, NP on 01/04/2012. (9:00)    Contact information:   [facility] [address] [place] [address] [phone]          Outstanding Labs/Studies:  1. Lexiscan Myoview as scheduled above  Duration of Discharge Encounter: Greater than 30 minutes including physician and PA time.  Signed, HOPE, JESSICA PA-C 12/19/2011, 3:17 PM  Attending Note:   The patient was seen and examined.  Agree with assessment and plan as noted above.  See my note from earlier the day of discharge  Alvia Grove., MD, [facility] 12/20/2011, 4:46 PM

## 2011-12-19 NOTE — Progress Notes (Signed)
ANTICOAGULATION CONSULT NOTE - Initial Consult  Pharmacy Consult for heparin Indication: chest pain/ACS  Allergies  Allergen Reactions  . Augmentin (Amoxicillin-Pot Clavulanate)     Abdominal pain    Patient Measurements: Height: 5\' 8"  (172.7 cm) Weight: 255 lb 11.7 oz (116 kg) IBW/kg (Calculated) : 63.9  Heparin Dosing Weight: 95kg  Vital Signs: Temp: 98.6 F (37 C) (06/26 0057) Temp src: Oral (06/26 0057) BP: 103/55 mmHg (06/26 0200) Pulse Rate: 77  (06/26 0200)  Labs:  Basename 12/18/11 2210 12/18/11 2011  HGB -- 12.5  HCT -- 36.3  PLT -- 145*  APTT 35 --  LABPROT 13.6 --  INR 1.02 --  HEPARINUNFRC -- --  CREATININE -- 0.70  CKTOTAL -- --  CKMB -- --  TROPONINI -- <0.30    Estimated Creatinine Clearance: 95 ml/min (by C-G formula based on Cr of 0.7).   Medical History: Past Medical History  Diagnosis Date  . Obesity   . Hypertension   . Hyperlipidemia   . Diabetes mellitus     never on meds, A1c 7.0  . Unspecified hypothyroidism   . Venous stasis of lower extremity   . Diastolic dysfunction   . Tobacco dependence     Medications:  Prescriptions prior to admission  Medication Sig Dispense Refill  . amLODipine (NORVASC) 5 MG tablet Take 1 tablet (5 mg total) by mouth daily.  90 tablet  1  . aspirin 81 MG tablet Take 81 mg by mouth daily.        Marland Kitchen atorvastatin (LIPITOR) 40 MG tablet TAKE 1 TABLET BY MOUTH EVERY DAY  90 tablet  0  . benazepril-hydrochlorthiazide (LOTENSIN HCT) 20-12.5 MG per tablet TAKE 2 TABLETS BY MOUTH EVERY DAY  180 tablet  1  . Cholecalciferol (VITAMIN D) 1000 UNITS capsule Take 2,000 Units by mouth daily.       Marland Kitchen ibuprofen (ADVIL,MOTRIN) 200 MG tablet Take 200 mg by mouth every 6 (six) hours as needed. Patient used this medication for her chest pain.      Marland Kitchen levothyroxine (SYNTHROID, LEVOTHROID) 50 MCG tablet Take 1 tablet (50 mcg total) by mouth daily.  90 tablet  1   Scheduled:    . amLODipine  5 mg Oral Daily  . aspirin       . aspirin EC  325 mg Oral Daily  . atorvastatin  40 mg Oral q1800  . benazepril  20 mg Oral Daily  . heparin  4,000 Units Intravenous Once  . hydrochlorothiazide  12.5 mg Oral Daily  . levothyroxine  50 mcg Oral Daily  . metoprolol tartrate  25 mg Oral BID  . DISCONTD: benazepril-hydrochlorthiazide  1 tablet Oral Daily    Assessment: 66yo female c/o CP, started on heparin at [facility], to continue for concern for USAP.  Goal of Therapy:  Heparin level 0.3-0.7 units/ml Monitor platelets by anticoagulation protocol: Yes   Plan:  Rec'd heparin bolus of 4000 units x1 followed by gtt at 1000 units/hr per EDMD; will increase gtt to 1300 units/hr and monitor heparin levels and CBC.  Colleen Can PharmD BCPS 12/19/2011,2:51 AM

## 2011-12-19 NOTE — Progress Notes (Signed)
DISCHARGED HOME BY WHEELCHAIR  ACCOMPANIED BY HUSBAND, DISCHARGE INSTRUCTIONS AND BELONGINGS WITH PT. STABLE

## 2011-12-19 NOTE — H&P (Signed)
Primary Cardiologist: Nahser  CC: Chest pain  HPI: 66 YOWF with PMH of HTN, HLD, Tobacco abuse who presents for evaluation of chest pain.  She was in her usual state of health when she developed central chest pain that radiated to her back.  It started around 130pm and lasted until 730 pm when she sought medical care.  It was ultimately relieved with NTG and she is chest pain free.  She denies CHF sxs or recent illnesses.    At the OSH, she was given ASA, NTG, IV heparin and tx to [facility].  Of note, EKGs show subtle changes in the lateral leads.     Past Medical History  Diagnosis Date  . Obesity   . Hypertension   . Hyperlipidemia   . Diabetes mellitus     never on meds, A1c 7.0  . Unspecified hypothyroidism   . Venous stasis of lower extremity   . Diastolic dysfunction   . Tobacco dependence     History   Social History  . Marital Status: Married    Spouse Name: N/A    Number of Children: 0  . Years of Education: N/A   Occupational History  . Retired (Presenter, broadcasting) [employer]   Social History Main Topics  . Smoking status: Current Everyday Smoker -- 0.5 packs/day for 46 years    Types: Cigarettes  . Smokeless tobacco: Never Used   Comment: 1 ppd since age 79; quit x 7 months with Chantix in past  . Alcohol Use: Yes     maybe 1 drink per week.  . Drug Use: No  . Sexually Active: Not Currently   Other Topics Concern  . 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 Cancer Mother     breast cancer (70), colon cancer  . Diabetes Mother   . Hypertension Mother   . Diabetes Father     Medications Prior to Admission  Medication Sig Dispense Refill  . amLODipine (NORVASC) 5 MG tablet Take 1 tablet (5 mg total) by mouth daily.  90 tablet  1  . aspirin 81 MG tablet Take 81 mg by mouth daily.        Marland Kitchen atorvastatin (LIPITOR) 40 MG tablet TAKE 1 TABLET BY MOUTH EVERY  DAY  90 tablet  0  . benazepril-hydrochlorthiazide (LOTENSIN HCT) 20-12.5 MG per tablet TAKE 2 TABLETS BY MOUTH EVERY DAY  180 tablet  1  . Cholecalciferol (VITAMIN D) 1000 UNITS capsule Take 2,000 Units by mouth daily.       Marland Kitchen ibuprofen (ADVIL,MOTRIN) 200 MG tablet Take 200 mg by mouth every 6 (six) hours as needed. Patient used this medication for her chest pain.      Marland Kitchen levothyroxine (SYNTHROID, LEVOTHROID) 50 MCG tablet Take 1 tablet (50 mcg total) by mouth daily.  90 tablet  1    Allergies  Allergen Reactions  . Augmentin (Amoxicillin-Pot Clavulanate)     Abdominal pain    Review of Systems: All systems reviewed and are negative except as mentioned above in the history of present illness.    PHYSICAL EXAM: Filed Vitals:   12/19/11 0200  BP: 103/55  Pulse: 77  Temp:   Resp: 14   GENERAL: No acute distress.  Obese. HEENT: Normocephalic, atraumatic.  Oropharynx is pink and moist without lesions.  NECK: Supple, no LAD, no JVD, no masses.  CV: Regular rate and rhythm with no murmurs, rubs, or gallops.   LUNGS: Clear to auscultation bilaterally.   ABDOMEN: +BS, soft, nontender, nondistended.  EXTREMITIES: No clubbing, cyanosis, or edema.   NEURO: AO x 3, no focal deficits. PYSCH: Normal affect. SKIN: No rashes.    ECG: SR 86 BPM with 1st degree AVB.  EKGs demonstrate subtle dynamic changes in the lateral leads.   Results for orders placed during the hospital encounter of 12/18/11 (from the past 24 hour(s))  CBC     Status: Abnormal   Collection Time   12/18/11  8:11 PM      Component Value Range   WBC 10.1  4.0 - 10.5 K/uL   RBC 3.88  3.87 - 5.11 MIL/uL   Hemoglobin 12.5  12.0 - 15.0 g/dL   HCT 16.1  09.6 - 04.5 %   MCV 93.6  78.0 - 100.0 fL   MCH 32.2  26.0 - 34.0 pg   MCHC 34.4  30.0 - 36.0 g/dL   RDW 40.9  81.1 - 91.4 %   Platelets 145 (*) 150 - 400 K/uL  DIFFERENTIAL     Status: Abnormal   Collection Time   12/18/11  8:11 PM      Component Value Range    Neutrophils Relative 72  43 - 77 %   Neutro Abs 7.2  1.7 - 7.7 K/uL   Lymphocytes Relative 15  12 - 46 %   Lymphs Abs 1.5  0.7 - 4.0 K/uL   Monocytes Relative 11  3 - 12 %   Monocytes Absolute 1.1 (*) 0.1 - 1.0 K/uL   Eosinophils Relative 2  0 - 5 %   Eosinophils Absolute 0.2  0.0 - 0.7 K/uL   Basophils Relative 0  0 - 1 %   Basophils Absolute 0.0  0.0 - 0.1 K/uL  COMPREHENSIVE METABOLIC PANEL     Status: Abnormal   Collection Time   12/18/11  8:11 PM      Component Value Range   Sodium 136  135 - 145 mEq/L   Potassium 3.9  3.5 - 5.1 mEq/L   Chloride 98  96 - 112 mEq/L   CO2 26  19 - 32 mEq/L   Glucose, Bld 97  70 - 99 mg/dL   BUN 26 (*) 6 - 23 mg/dL   Creatinine, Ser 7.82  0.50 - 1.10 mg/dL   Calcium 95.6  8.4 - 21.3 mg/dL   Total Protein 8.2  6.0 - 8.3 g/dL   Albumin 4.3  3.5 - 5.2 g/dL   AST 20  0 - 37 U/L   ALT 13  0 - 35 U/L   Alkaline Phosphatase 132 (*) 39 - 117 U/L   Total Bilirubin 0.6  0.3 - 1.2 mg/dL   GFR calc non Af Amer 90 (*) >90 mL/min   GFR calc Af Amer >90  >90 mL/min  TROPONIN I     Status: Normal   Collection Time   12/18/11  8:11 PM      Component Value Range   Troponin I <0.30  <0.30 ng/mL  D-DIMER, QUANTITATIVE     Status: Abnormal   Collection Time   12/18/11  8:51 PM      Component Value Range   D-Dimer, Quant 1.97 (*) 0.00 - 0.48 ug/mL-FEU  PROTIME-INR     Status: Normal   Collection Time   12/18/11 10:10 PM      Component Value Range   Prothrombin Time  13.6  11.6 - 15.2 seconds   INR 1.02  0.00 - 1.49  APTT     Status: Normal   Collection Time   12/18/11 10:10 PM      Component Value Range   aPTT 35  24 - 37 seconds   Ct Angio Chest W/cm &/or Wo Cm  12/18/2011  *RADIOLOGY REPORT*  Clinical Data: Chest pain, shortness of breath, and swollen feet.  CT ANGIOGRAPHY CHEST  Technique:  Multidetector CT imaging of the chest using the standard protocol during bolus administration of intravenous contrast. Multiplanar reconstructed images including  MIPs were obtained and reviewed to evaluate the vascular anatomy.  Contrast: 80mL OMNIPAQUE IOHEXOL 350 MG/ML SOLN  Comparison: 06/28/2004  Findings: Technically adequate study with moderately good opacification of the central and segmental pulmonary arteries.  No focal filling defects are demonstrated.  No evidence of significant pulmonary embolus.  Mild cardiac enlargement.  Normal caliber thoracic aorta with calcification.  Calcifications scattered in the coronary arteries.  No significant lymphadenopathy in the chest. The esophagus is decompressed.  Visualized portions of the upper abdominal organs are grossly unremarkable.  Visualization of the lung fields is limited due to respiratory motion artifact but no focal airspace consolidation or interstitial changes are appreciated.  No pneumothorax.  No pleural effusions. Slight fibrosis or linear atelectasis in the lung bases. Degenerative changes in the thoracic spine.  IMPRESSION: No evidence of significant pulmonary embolus.  Original Report Authenticated By: Marlon Pel, M.D.   Dg Chest Port 1 View  12/18/2011  *RADIOLOGY REPORT*  Clinical Data: Chest pain, shortness of breath  PORTABLE CHEST - 1 VIEW  Comparison: 01/26/2008; 07/01/2005; 04/05/2005  Findings: Unchanged enlarged cardiac silhouette and mediastinal contours.  The pulmonary vasculature is indistinct with cephalization of flow.  No definite pleural effusion or pneumothorax.  Minimal bibasilar heterogeneous opacities. Unchanged bones.  IMPRESSION:   Enlarged cardiac silhouette with findings suggestive of pulmonary edema.  Original Report Authenticated By: Waynard Reeds, M.D.     ASSESSMENT and PLAN: 33 YOWF with PMH of HTN, HLD, Tobacco abuse who presents with chest pain, normal troponins and subtle lateral EKG changes.    1: Admit to [facility] - [facility]  2: Chest pain, concerning for unstable angina - she is at moderate risk for CAD given her risk factors and subtle EKG  changes.  Continue treatment with ASA, IV heparin, and add BB.  Continue home statin and anti-hypertensives.  Cycle CE, monitor on tele and check EKGs.  She will need an ischemia evaluation prior to discharge; at this point, I would consider cath, however, we should see how she does clinically tonight.    3: HTN - well controlled; continue home medications.  4: HLD - continue lipitor.  Check FLP.   5: FEN: SLIVF, Electrolytes are stable, NPO after midnight.    6: DVT prophylaxis: NA as patient on IV heparin.  Bernestine Amass. Mayford Knife, MD Level 3 Admission

## 2011-12-19 NOTE — Progress Notes (Signed)
AMBULATED ALONG THE HALLWAY, APROX. 750 FEET TOLERATED WELL. NO COMPLAINTS PRESENTED.

## 2011-12-19 NOTE — Progress Notes (Signed)
PROGRESS NOTE  Subjective:   Nancy West is a 66 y.o. female, previous patient of Dr. Reyes Ivan, hx of HTN, Hyperlipidemia, and cigarette smoking.  She was admitted 01-15-2023 with 6 hours of chest pain relieved with NTG.  Enzymes are negative so far.   Objective:    Vital Signs:   Temp:  [97.5 F (36.4 C)-98.6 F (37 C)] 98 F (36.7 C) (06/26 0357) Pulse Rate:  [72-97] 78  (06/26 0600) Resp:  [13-24] 17  (06/26 0600) BP: (101-151)/(48-75) 122/63 mmHg (06/26 0600) SpO2:  [95 %-97 %] 97 % (06/26 0600) Weight:  [255 lb 11.7 oz (116 kg)] 255 lb 11.7 oz (116 kg) (06/26 0057)      24-hour weight change: Weight change:   Weight trends: Filed Weights   12/19/11 0057  Weight: 255 lb 11.7 oz (116 kg)    Intake/Output:  01/15/2023 0701 - 06/26 0700 In: 124.2 [I.V.:124.2] Out: -      Physical Exam: BP 122/63  Pulse 78  Temp 98 F (36.7 C) (Oral)  Resp 17  Ht 5\' 8"  (1.727 m)  Wt 255 lb 11.7 oz (116 kg)  BMI 38.88 kg/m2  SpO2 97%  General: Vital signs reviewed and noted. Well-developed, well-nourished, in no acute distress; alert, appropriate and cooperative .  Head: Normocephalic, atraumatic.  Eyes: conjunctivae/corneas clear.  EOM's intact.   Throat: normal  Neck: Supple. Normal carotids. No JVD  Lungs:  Clear to auscultation  Heart: Regular rate,  With normal  S1 S2. No murmurs, gallops or rubs  Abdomen:  Soft, non-tender, non-distended with normoactive bowel sounds. No hepatomegaly. No rebound/guarding. No abdominal masses.  Extremities: Distal pedal pulses are 2+ .  No edema.    Neurologic: A&O X3, CN II - XII are grossly intact. Motor strength is 5/5 in the all 4 extremities.  Psych: Responds to questions appropriately with normal affect.    Labs: BMET:  Basename 12/19/11 0256 01/15/2012 2011  NA 136 136  K 4.1 3.9  CL 100 98  CO2 26 26  GLUCOSE 117* 97  BUN 21 26*  CREATININE 0.70 0.70  CALCIUM 9.2 10.0  MG -- --  PHOS -- --    Liver function tests:  [facility]  12/19/11 0256 01/15/12 2011  AST 15 20  ALT 12 13  ALKPHOS 110 132*  BILITOT 0.7 0.6  PROT 7.1 8.2  ALBUMIN 3.6 4.3   No results found for this basename: LIPASE:2,AMYLASE:2 in the last 72 hours  CBC:  Basename 12/19/11 0256 01-15-2012 2011  WBC 11.2* 10.1  NEUTROABS 9.0* 7.2  HGB 11.4* 12.5  HCT 34.0* 36.3  MCV 93.9 93.6  PLT 149* 145*    Cardiac Enzymes:  Basename 12/19/11 0256 15-Jan-2012 2011  CKTOTAL 34 --  CKMB 1.8 --  TROPONINI <0.30 <0.30    Coagulation Studies:  Basename 12/19/11 0256 01-15-2012 2210  LABPROT 14.3 13.6  INR 1.09 1.02    Other: No components found with this basename: POCBNP:3  Basename Jan 15, 2012 2051  DDIMER 1.97*   No results found for this basename: HGBA1C in the last 72 hours  Basename 12/19/11 0256  CHOL 112  HDL 55  LDLCALC 49  TRIG 42  CHOLHDL 2.0   No results found for this basename: TSH,T4TOTAL,FREET3,T3FREE,THYROIDAB in the last 72 hours No results found for this basename: VITAMINB12,FOLATE,FERRITIN,TIBC,IRON,RETICCTPCT in the last 72 hours   Tele   :  NSR  ECG: December 18, 2012  - NSR with 1st degree AV block. SN T abnormality.  No significant changes from previous ECG in August, 2012    Medications:    Infusions:    . sodium chloride 10 mL (12/19/11 0322)  . heparin 1,300 Units/hr (12/19/11 0322)    Scheduled Medications:    . amLODipine  5 mg Oral Daily  . aspirin      . aspirin EC  325 mg Oral Daily  . atorvastatin  40 mg Oral q1800  . benazepril  20 mg Oral Daily  . heparin  4,000 Units Intravenous Once  . hydrochlorothiazide  12.5 mg Oral Daily  . levothyroxine  50 mcg Oral Daily  . metoprolol tartrate  25 mg Oral BID  . DISCONTD: benazepril-hydrochlorthiazide  1 tablet Oral Daily    Assessment/ Plan:    1. Chest pain:  Cardiac enzymes are negative so far despite having 6 hours of chest ache.  Will check a total of 3 sets of cardiac enzymes and possible DC later today.   I agree that she needs ischemic  evaluation .  I do not think she needs cath at this point since she is completely better and her enzymes are negative.  She will need to have a 2 day  Myoview because of her obesity.    I would hate to run up her hospital bill by keeping her in the hospital for 2 days to do a test that we can do as an outpatient.  CT angio of lungs was negative for PE  Plan:   DC heparin, ok to give her breakfast, ambulate, continue to collect cardiac enzymes.  If she does well  this am and early afternoon, will DC her to home  Around 4 PM with follow up in our office for a 2 day Lexiscan Myoview.  She may have script for NTG SL at discharge.  We have discussed smoking cessation.  2, Morbid obesity: she has lost 100 lbs over the past year or so.  I congratulated her for this great accomplishment.   Disposition: ambulate , DC to home this afternoon if enzymes are negative and is she remains pain free. Length of Stay: 1  Vesta Mixer, Montez Hageman., MD, [facility] 12/19/2011, 7:18 AM Office [phone] Pager [phone]

## 2011-12-19 NOTE — ED Notes (Signed)
Report given to RN on 2900.  

## 2011-12-19 NOTE — Care Management Note (Signed)
    Page 1 of 1   12/19/2011     8:46:32 AM   CARE MANAGEMENT NOTE 12/19/2011  Patient:  Nancy West, Nancy West   Account Number:  [id number]  Date Initiated:  12/19/2011  Documentation initiated by:  Junius Creamer  Subjective/Objective Assessment:   adm w ch pain , poss angina     Action/Plan:   lives w husband, pcp dr Sharlot Gowda   Anticipated DC Date:     Anticipated DC Plan:  HOME/SELF CARE      DC Planning Services  CM consult      Choice offered to / List presented to:             Status of service:   Medicare Important Message given?   (If response is "NO", the following Medicare IM given date fields will be blank) Date Medicare IM given:   Date Additional Medicare IM given:    Discharge Disposition:  HOME/SELF CARE  Per UR Regulation:  Reviewed for med. necessity/level of care/duration of stay  If discussed at Long Length of Stay Meetings, dates discussed:    Comments:  6/26 8:45a debbie Tahisha Hakim rn,bsn [phone]

## 2011-12-24 ENCOUNTER — Ambulatory Visit (HOSPITAL_COMMUNITY): Payer: PRIVATE HEALTH INSURANCE | Attending: Cardiology | Admitting: Radiology

## 2011-12-24 VITALS — BP 122/60 | Ht 68.0 in | Wt 254.0 lb

## 2011-12-24 DIAGNOSIS — I10 Essential (primary) hypertension: Secondary | ICD-10-CM | POA: Insufficient documentation

## 2011-12-24 DIAGNOSIS — R0989 Other specified symptoms and signs involving the circulatory and respiratory systems: Secondary | ICD-10-CM | POA: Insufficient documentation

## 2011-12-24 DIAGNOSIS — R0602 Shortness of breath: Secondary | ICD-10-CM | POA: Insufficient documentation

## 2011-12-24 DIAGNOSIS — E785 Hyperlipidemia, unspecified: Secondary | ICD-10-CM | POA: Insufficient documentation

## 2011-12-24 DIAGNOSIS — E7800 Pure hypercholesterolemia, unspecified: Secondary | ICD-10-CM

## 2011-12-24 DIAGNOSIS — E669 Obesity, unspecified: Secondary | ICD-10-CM | POA: Insufficient documentation

## 2011-12-24 DIAGNOSIS — F17200 Nicotine dependence, unspecified, uncomplicated: Secondary | ICD-10-CM | POA: Insufficient documentation

## 2011-12-24 DIAGNOSIS — R079 Chest pain, unspecified: Secondary | ICD-10-CM

## 2011-12-24 DIAGNOSIS — R0609 Other forms of dyspnea: Secondary | ICD-10-CM | POA: Insufficient documentation

## 2011-12-24 DIAGNOSIS — R0789 Other chest pain: Secondary | ICD-10-CM

## 2011-12-24 DIAGNOSIS — E119 Type 2 diabetes mellitus without complications: Secondary | ICD-10-CM

## 2011-12-24 HISTORY — PX: PERICARDIOCENTESIS: SHX2215

## 2011-12-24 MED ORDER — TECHNETIUM TC 99M TETROFOSMIN IV KIT
30.0000 | PACK | Freq: Once | INTRAVENOUS | Status: AC | PRN
  Administered 2011-12-24: 30 via INTRAVENOUS

## 2011-12-24 MED ORDER — REGADENOSON 0.4 MG/5ML IV SOLN
0.4000 mg | Freq: Once | INTRAVENOUS | Status: AC
  Administered 2011-12-24: 0.4 mg via INTRAVENOUS

## 2011-12-24 NOTE — Progress Notes (Signed)
[facility] SITE 3 NUCLEAR MED [address] [place] [address] [phone]  Cardiology Nuclear Med Study  Nancy West is a 66 y.o. female     MRN : [id number]     DOB: [birth year]  Procedure Date: 12/24/2011  Nuclear Med Background Indication for Stress Test:  Evaluation for Ischemia,and Patient seen in hospital on 12/18/11 for CP, Enzymes negative History:  No prior known history of CAD, and 03/11/11 Echo: EF=55-60%, mild LVH Cardiac Risk Factors: Hypertension, Lipids, NIDDM, Obesity and Smoker  Symptoms: Chest Pain, tightness, and pressure that radiates through to back, and to bilateral jaws with/without exertion (last occurrence 12-18-11),  DOE and SOB   Nuclear Pre-Procedure Caffeine/Decaff Intake:  None > 12 hrs NPO After: 8:00am   Lungs:  Minimal expiratory rhonchi, no wheezing O2 Sat: 94% room air. IV 0.9% NS with Angio Cath:  22g  IV Site: L Hand x 1, tolerated well IV Started by:  Irean Hong, RN  Chest Size (in):  44 Cup Size: DD  Height: 5\' 8"  (1.727 m)  Weight:  254 lb (115.214 kg)  BMI:  Body mass index is 38.62 kg/(m^2). Tech Comments:  n/a    Nuclear Med Study 1 or 2 day study: 2 day  Stress Test Type:  Lexiscan  Reading MD: Olga Millers, MD  Order Authorizing Provider:  Kristeen Miss, MD  Resting Radionuclide: Technetium 23m Tetrofosmin  Resting Radionuclide Dose: 32.9 mCi on 12/25/11   Stress Radionuclide:  Technetium 62m Tetrofosmin  Stress Radionuclide Dose: 33.0 mCi on 12/24/11           Stress Protocol Rest HR: 80 Stress HR: 102  Rest BP: 122/60 Stress BP: 128/60  Exercise Time (min): n/a METS: n/a   Predicted Max HR: 156 bpm % Max HR: 65.38 bpm Rate Pressure Product: 21308   Dose of Adenosine (mg):  n/a Dose of Lexiscan: 0.4 mg  Dose of Atropine (mg): n/a Dose of Dobutamine: n/a mcg/kg/min (at max HR)  Stress Test Technologist: Irean Hong, RN  Nuclear Technologist:  Domenic Polite, CNMT     Rest Procedure:   Myocardial perfusion imaging was performed at rest 45 minutes following the intravenous administration of Technetium 70m Tetrofosmin. Rest ECG: Atrial Fibrilliation and NSR with 1st AVB, T wave changes, and poor R wave progression  Stress Procedure:  The patient received IV Lexiscan 0.4 mg over 15-seconds.  Technetium 59m Tetrofosmin injected at 30-seconds.  The EKG was non diagnostic due to baseline T wave changes. The patient complained of chest tightness with Lexiscan.  Quantitative spect images were obtained after a 45 minute delay. Stress ECG: No significant change from baseline ECG  QPS Raw Data Images:  Normal; no motion artifact; normal heart/lung ratio. Stress Images:  Normal homogeneous uptake in all areas of the myocardium. Rest Images:  Normal homogeneous uptake in all areas of the myocardium. Subtraction (SDS):  There is no evidence of scar or ischemia. Transient Ischemic Dilatation (Normal <1.22):  0.91 Lung/Heart Ratio (Normal <0.45):  0.47  Quantitative Gated Spect Images QGS EDV:  134 ml QGS ESV:  60 ml  Impression Exercise Capacity:  Lexiscan with no exercise. BP Response:  Normal Clinical Symptoms:  Chest tightness.  ECG Impression:  No significant ST segment change suggestive of ischemia. Comparison with Prior Nuclear Study: No previous nuclear study performed  Overall Impression:  Normal stress nuclear study.  LV Ejection Fraction: 56%.  LV Wall Motion:  NL LV Function; NL Wall Motion  [employer]  12/25/2011        

## 2011-12-25 ENCOUNTER — Ambulatory Visit (HOSPITAL_COMMUNITY): Payer: PRIVATE HEALTH INSURANCE | Attending: Cardiology

## 2011-12-25 DIAGNOSIS — R0989 Other specified symptoms and signs involving the circulatory and respiratory systems: Secondary | ICD-10-CM

## 2011-12-25 MED ORDER — TECHNETIUM TC 99M TETROFOSMIN IV KIT
33.0000 | PACK | Freq: Once | INTRAVENOUS | Status: AC | PRN
  Administered 2011-12-25: 33 via INTRAVENOUS

## 2011-12-30 ENCOUNTER — Encounter (HOSPITAL_COMMUNITY): Payer: Self-pay | Admitting: *Deleted

## 2011-12-30 ENCOUNTER — Emergency Department (HOSPITAL_COMMUNITY): Payer: PRIVATE HEALTH INSURANCE

## 2011-12-30 ENCOUNTER — Inpatient Hospital Stay (HOSPITAL_COMMUNITY)
Admission: EM | Admit: 2011-12-30 | Discharge: 2012-01-04 | DRG: 314 | Disposition: A | Discharge status: Home / Self Care | Payer: PRIVATE HEALTH INSURANCE | Attending: Cardiology | Admitting: Cardiology

## 2011-12-30 DIAGNOSIS — I5033 Acute on chronic diastolic (congestive) heart failure: Secondary | ICD-10-CM | POA: Diagnosis present

## 2011-12-30 DIAGNOSIS — E039 Hypothyroidism, unspecified: Secondary | ICD-10-CM | POA: Diagnosis present

## 2011-12-30 DIAGNOSIS — I314 Cardiac tamponade: Secondary | ICD-10-CM | POA: Diagnosis present

## 2011-12-30 DIAGNOSIS — Z6839 Body mass index (BMI) 39.0-39.9, adult: Secondary | ICD-10-CM

## 2011-12-30 DIAGNOSIS — Z8249 Family history of ischemic heart disease and other diseases of the circulatory system: Secondary | ICD-10-CM

## 2011-12-30 DIAGNOSIS — I4892 Unspecified atrial flutter: Secondary | ICD-10-CM | POA: Diagnosis present

## 2011-12-30 DIAGNOSIS — E559 Vitamin D deficiency, unspecified: Secondary | ICD-10-CM | POA: Diagnosis present

## 2011-12-30 DIAGNOSIS — I319 Disease of pericardium, unspecified: Secondary | ICD-10-CM

## 2011-12-30 DIAGNOSIS — E871 Hypo-osmolality and hyponatremia: Secondary | ICD-10-CM | POA: Diagnosis not present

## 2011-12-30 DIAGNOSIS — D649 Anemia, unspecified: Secondary | ICD-10-CM | POA: Diagnosis present

## 2011-12-30 DIAGNOSIS — I872 Venous insufficiency (chronic) (peripheral): Secondary | ICD-10-CM | POA: Diagnosis present

## 2011-12-30 DIAGNOSIS — E7800 Pure hypercholesterolemia, unspecified: Secondary | ICD-10-CM

## 2011-12-30 DIAGNOSIS — Z833 Family history of diabetes mellitus: Secondary | ICD-10-CM

## 2011-12-30 DIAGNOSIS — E119 Type 2 diabetes mellitus without complications: Secondary | ICD-10-CM | POA: Diagnosis present

## 2011-12-30 DIAGNOSIS — I2789 Other specified pulmonary heart diseases: Secondary | ICD-10-CM | POA: Diagnosis present

## 2011-12-30 DIAGNOSIS — I309 Acute pericarditis, unspecified: Secondary | ICD-10-CM

## 2011-12-30 DIAGNOSIS — I313 Pericardial effusion (noninflammatory): Secondary | ICD-10-CM

## 2011-12-30 DIAGNOSIS — I5032 Chronic diastolic (congestive) heart failure: Secondary | ICD-10-CM | POA: Diagnosis present

## 2011-12-30 DIAGNOSIS — E785 Hyperlipidemia, unspecified: Secondary | ICD-10-CM | POA: Diagnosis present

## 2011-12-30 DIAGNOSIS — E669 Obesity, unspecified: Secondary | ICD-10-CM | POA: Diagnosis present

## 2011-12-30 DIAGNOSIS — I509 Heart failure, unspecified: Secondary | ICD-10-CM | POA: Diagnosis present

## 2011-12-30 DIAGNOSIS — I4891 Unspecified atrial fibrillation: Secondary | ICD-10-CM

## 2011-12-30 DIAGNOSIS — I10 Essential (primary) hypertension: Secondary | ICD-10-CM

## 2011-12-30 DIAGNOSIS — F17200 Nicotine dependence, unspecified, uncomplicated: Secondary | ICD-10-CM | POA: Diagnosis present

## 2011-12-30 HISTORY — DX: Hypothyroidism, unspecified: E03.9

## 2011-12-30 HISTORY — DX: Chest pain, unspecified: R07.9

## 2011-12-30 HISTORY — DX: Unspecified atrial flutter: I48.92

## 2011-12-30 HISTORY — DX: Other pericardial effusion (noninflammatory): I31.39

## 2011-12-30 HISTORY — DX: Reserved for concepts with insufficient information to code with codable children: IMO0002

## 2011-12-30 HISTORY — DX: Unspecified atrial fibrillation: I48.91

## 2011-12-30 HISTORY — DX: Unspecified diastolic (congestive) heart failure: I50.30

## 2011-12-30 HISTORY — DX: Pericardial effusion (noninflammatory): I31.3

## 2011-12-30 HISTORY — DX: Anemia, unspecified: D64.9

## 2011-12-30 LAB — CBC WITH DIFFERENTIAL/PLATELET
Basophils Absolute: 0 10*3/uL (ref 0.0–0.1)
Basophils Relative: 0 % (ref 0–1)
HCT: 31.7 % — ABNORMAL LOW (ref 36.0–46.0)
Lymphocytes Relative: 10 % — ABNORMAL LOW (ref 12–46)
Monocytes Absolute: 1.2 10*3/uL — ABNORMAL HIGH (ref 0.1–1.0)
Neutro Abs: 9.6 10*3/uL — ABNORMAL HIGH (ref 1.7–7.7)
Neutrophils Relative %: 80 % — ABNORMAL HIGH (ref 43–77)
Platelets: 250 10*3/uL (ref 150–400)
RDW: 12.8 % (ref 11.5–15.5)
WBC: 12.1 10*3/uL — ABNORMAL HIGH (ref 4.0–10.5)

## 2011-12-30 LAB — GLUCOSE, CAPILLARY: Glucose-Capillary: 101 mg/dL — ABNORMAL HIGH (ref 70–99)

## 2011-12-30 LAB — URINE MICROSCOPIC-ADD ON

## 2011-12-30 LAB — POCT I-STAT, CHEM 8
BUN: 34 mg/dL — ABNORMAL HIGH (ref 6–23)
Chloride: 94 mEq/L — ABNORMAL LOW (ref 96–112)
HCT: 33 % — ABNORMAL LOW (ref 36.0–46.0)
Potassium: 4 mEq/L (ref 3.5–5.1)
Sodium: 128 mEq/L — ABNORMAL LOW (ref 135–145)

## 2011-12-30 LAB — PROTIME-INR
INR: 1.1 (ref 0.00–1.49)
Prothrombin Time: 14.4 seconds (ref 11.6–15.2)

## 2011-12-30 LAB — URINALYSIS, ROUTINE W REFLEX MICROSCOPIC
Glucose, UA: NEGATIVE mg/dL
Nitrite: NEGATIVE
Protein, ur: NEGATIVE mg/dL

## 2011-12-30 LAB — CARDIAC PANEL(CRET KIN+CKTOT+MB+TROPI)
CK, MB: 2.2 ng/mL (ref 0.3–4.0)
Troponin I: <0.3 ng/mL (ref <0.30)

## 2011-12-30 LAB — PRO B NATRIURETIC PEPTIDE: Pro B Natriuretic peptide (BNP): 499.3 pg/mL — ABNORMAL HIGH (ref 0–125)

## 2011-12-30 MED ORDER — ASPIRIN 81 MG PO CHEW
81.0000 mg | CHEWABLE_TABLET | Freq: Every day | ORAL | Status: DC
  Administered 2011-12-31 – 2012-01-04 (×5): 81 mg via ORAL
  Filled 2011-12-30 (×5): qty 1

## 2011-12-30 MED ORDER — ZOLPIDEM TARTRATE 5 MG PO TABS: 5.0000 mg | ORAL_TABLET | Freq: Every evening | ORAL | Status: DC | PRN

## 2011-12-30 MED ORDER — ONDANSETRON HCL 4 MG/2ML IJ SOLN: 4.0000 mg | Freq: Four times a day (QID) | INTRAMUSCULAR | Status: DC | PRN

## 2011-12-30 MED ORDER — NITROGLYCERIN 0.4 MG SL SUBL: 0.4000 mg | SUBLINGUAL_TABLET | SUBLINGUAL | Status: DC | PRN

## 2011-12-30 MED ORDER — ASPIRIN 81 MG PO TABS: 81.0000 mg | ORAL_TABLET | Freq: Every day | ORAL | Status: DC

## 2011-12-30 MED ORDER — BENAZEPRIL-HYDROCHLOROTHIAZIDE 20-12.5 MG PO TABS: 2.0000 | ORAL_TABLET | Freq: Every day | ORAL | Status: DC

## 2011-12-30 MED ORDER — AMLODIPINE BESYLATE 5 MG PO TABS
5.0000 mg | ORAL_TABLET | Freq: Every day | ORAL | Status: DC
  Administered 2011-12-31 – 2012-01-03 (×4): 5 mg via ORAL
  Filled 2011-12-30 (×5): qty 1

## 2011-12-30 MED ORDER — SODIUM CHLORIDE 0.9 % IV SOLN: 250.0000 mL | INTRAVENOUS | Status: DC | PRN

## 2011-12-30 MED ORDER — LEVOTHYROXINE SODIUM 50 MCG PO TABS
50.0000 ug | ORAL_TABLET | Freq: Every day | ORAL | Status: DC
  Administered 2011-12-31 – 2012-01-04 (×5): 50 ug via ORAL
  Filled 2011-12-30 (×6): qty 1

## 2011-12-30 MED ORDER — SODIUM CHLORIDE 0.9 % IJ SOLN
3.0000 mL | Freq: Two times a day (BID) | INTRAMUSCULAR | Status: DC
  Administered 2011-12-30 – 2012-01-04 (×7): 3 mL via INTRAVENOUS

## 2011-12-30 MED ORDER — IBUPROFEN 600 MG PO TABS
600.0000 mg | ORAL_TABLET | Freq: Four times a day (QID) | ORAL | Status: DC
  Administered 2011-12-31 – 2012-01-04 (×15): 600 mg via ORAL
  Filled 2011-12-30 (×22): qty 1

## 2011-12-30 MED ORDER — VITAMIN D 1000 UNITS PO CAPS: 2000.0000 [IU] | ORAL_CAPSULE | Freq: Every day | ORAL | Status: DC

## 2011-12-30 MED ORDER — BENAZEPRIL HCL 20 MG PO TABS
20.0000 mg | ORAL_TABLET | Freq: Every day | ORAL | Status: DC
  Administered 2011-12-31 – 2012-01-04 (×5): 20 mg via ORAL
  Filled 2011-12-30 (×7): qty 1

## 2011-12-30 MED ORDER — FUROSEMIDE 10 MG/ML IJ SOLN
20.0000 mg | Freq: Once | INTRAMUSCULAR | Status: AC
  Administered 2011-12-30: 20 mg via INTRAVENOUS
  Filled 2011-12-30: qty 2

## 2011-12-30 MED ORDER — MORPHINE SULFATE 4 MG/ML IJ SOLN
4.0000 mg | Freq: Once | INTRAMUSCULAR | Status: AC
  Administered 2011-12-30: 4 mg via INTRAMUSCULAR
  Filled 2011-12-30: qty 1

## 2011-12-30 MED ORDER — MORPHINE SULFATE 4 MG/ML IJ SOLN: 4.0000 mg | Freq: Once | INTRAMUSCULAR | Status: DC

## 2011-12-30 MED ORDER — INSULIN ASPART 100 UNIT/ML ~~LOC~~ SOLN: 0.0000 [IU] | Freq: Three times a day (TID) | SUBCUTANEOUS | Status: DC

## 2011-12-30 MED ORDER — ALPRAZOLAM 0.25 MG PO TABS: 0.2500 mg | ORAL_TABLET | Freq: Two times a day (BID) | ORAL | Status: DC | PRN

## 2011-12-30 MED ORDER — VITAMIN D3 25 MCG (1000 UNIT) PO TABS
2000.0000 [IU] | ORAL_TABLET | Freq: Every day | ORAL | Status: DC
  Administered 2011-12-31 – 2012-01-04 (×5): 2000 [IU] via ORAL
  Filled 2011-12-30 (×6): qty 2

## 2011-12-30 MED ORDER — PANTOPRAZOLE SODIUM 40 MG PO TBEC
40.0000 mg | DELAYED_RELEASE_TABLET | Freq: Every day | ORAL | Status: DC
  Administered 2011-12-31 – 2012-01-04 (×4): 40 mg via ORAL
  Filled 2011-12-30 (×4): qty 1

## 2011-12-30 MED ORDER — ACETAMINOPHEN 325 MG PO TABS: 650.0000 mg | ORAL_TABLET | ORAL | Status: DC | PRN

## 2011-12-30 MED ORDER — COLCHICINE 0.6 MG PO TABS
0.6000 mg | ORAL_TABLET | Freq: Two times a day (BID) | ORAL | Status: DC
  Administered 2011-12-30 – 2012-01-04 (×10): 0.6 mg via ORAL
  Filled 2011-12-30 (×11): qty 1

## 2011-12-30 MED ORDER — HYDROCHLOROTHIAZIDE 12.5 MG PO CAPS
12.5000 mg | ORAL_CAPSULE | Freq: Every day | ORAL | Status: DC
  Administered 2011-12-31 – 2012-01-03 (×4): 12.5 mg via ORAL
  Filled 2011-12-30 (×6): qty 1

## 2011-12-30 MED ORDER — SODIUM CHLORIDE 0.9 % IJ SOLN: 3.0000 mL | INTRAMUSCULAR | Status: DC | PRN

## 2011-12-30 MED ORDER — ONDANSETRON 4 MG PO TBDP
4.0000 mg | ORAL_TABLET | Freq: Once | ORAL | Status: AC
  Administered 2011-12-30: 4 mg via ORAL
  Filled 2011-12-30: qty 1

## 2011-12-30 MED ORDER — ONDANSETRON HCL 4 MG/2ML IJ SOLN: 4.0000 mg | Freq: Once | INTRAMUSCULAR | Status: DC

## 2011-12-30 MED ORDER — ATORVASTATIN CALCIUM 40 MG PO TABS
40.0000 mg | ORAL_TABLET | Freq: Every day | ORAL | Status: DC
  Administered 2011-12-31 – 2012-01-04 (×5): 40 mg via ORAL
  Filled 2011-12-30 (×6): qty 1

## 2011-12-30 MED ORDER — TUBERCULIN PPD 5 UNIT/0.1ML ID SOLN
5.0000 [IU] | Freq: Once | INTRADERMAL | Status: AC
  Administered 2011-12-31: 5 [IU] via INTRADERMAL
  Filled 2011-12-30: qty 0.1

## 2011-12-30 MED ORDER — TUBERCULIN PPD 5 UNIT/0.1ML ID SOLN: 5.0000 [IU] | Freq: Once | INTRADERMAL | Status: DC

## 2011-12-30 NOTE — ED Notes (Signed)
Pt also reports being recently admitted to hospital for left side chest pain that went through to her back, was told it was not cardiac related and dc home, now pain returned to left side of upper back, reports recent weight gain, sob and decreased urine output.

## 2011-12-30 NOTE — ED Notes (Signed)
Cardiology at bedside.

## 2011-12-30 NOTE — ED Notes (Signed)
Pt reports sudden onset of upper left side back pain that started on Thursday. Denies injury to back. Ambulatory at triage with no acute distress noted.

## 2011-12-30 NOTE — ED Provider Notes (Signed)
History     CSN: [id number]  Arrival date & time 12/30/11  1151   First MD Initiated Contact with Patient 12/30/11 1346      Chief Complaint  Patient presents with  . Back Pain    (Consider location/radiation/quality/duration/timing/severity/associated sxs/prior treatment) HPI History from patient. 66 year old female past medical history of hypertension, hyperlipidemia, diabetes who presents with left upper back pain. She states that she was seen for similar pain which radiated from her chest to her back about 2 weeks ago. She was admitted by cardiology and ruled out for ACS with serial troponins; she also had a CTA of the chest to r/o PE which was negative. She had a Lexiscan performed at [facility] cardiology on 7/1 which showed normal ejection fraction, no evidence of myocardial motion abnormality or ischemia. Patient reports that her pain had resolved but returned on Thursday, 7/4. It was initially intermittent in nature but has since become constant. Pain is described as stabbing and is located to the left upper back just to the left of the spine. There are no known aggravating/alleviating factors. It does not worsen with movement or palpation of the area. She took ibuprofen without relief of her symptoms. Patient states that she has had associated weight gain, gaining about 11 pounds since Thursday, and has had decreased urine output. She denies any chest pain or palpitations. She has chronic shortness of breath secondary to tobacco use which has not worsened over the past. Patient states that she has had to take Lasix in the past which was primarily due to venous stasis, but she lost about 100 pounds through diet and exercise and was able to stop taking this.  Past Medical History  Diagnosis Date  . Obesity   . Hypertension   . Hyperlipidemia   . Diabetes mellitus     never on meds, A1c 7.0  . Unspecified hypothyroidism   . Venous stasis of lower extremity   . Diastolic dysfunction   .  Tobacco dependence     Past Surgical History  Procedure Date  . Cholecystectomy 1980  . Appendectomy     Family History  Problem Relation Age of Onset  . Cancer Mother     breast cancer (70), colon cancer  . Diabetes Mother   . Hypertension Mother   . Diabetes Father     History  Substance Use Topics  . Smoking status: Current Everyday Smoker -- 0.5 packs/day for 46 years    Types: Cigarettes  . Smokeless tobacco: Never Used   Comment: 1 ppd since age 5; quit x 7 months with Chantix in past  . Alcohol Use: Yes     maybe 1 drink per week.    OB History    Grav Para Term Preterm Abortions TAB SAB Ect Mult Living                  Review of Systems  Constitutional: Negative for fever, chills and appetite change.  Respiratory: Negative for cough, chest tightness, shortness of breath and wheezing.   Cardiovascular: Negative for chest pain, palpitations and leg swelling.  Gastrointestinal: Negative for nausea, vomiting and abdominal pain.  Musculoskeletal: Positive for back pain. Negative for myalgias.  Skin: Negative for color change and rash.  Neurological: Negative for dizziness and weakness.  All other systems reviewed and are negative.    Allergies  Augmentin  Home Medications   Current Outpatient Rx  Name Route Sig Dispense Refill  . AMLODIPINE BESYLATE 5 MG PO TABS  Oral Take 1 tablet (5 mg total) by mouth daily. 90 tablet 1  . ASPIRIN 81 MG PO TABS Oral Take 81 mg by mouth daily.      . ATORVASTATIN CALCIUM 40 MG PO TABS Oral Take 40 mg by mouth daily.    Marland Kitchen BENAZEPRIL-HYDROCHLOROTHIAZIDE 20-12.5 MG PO TABS Oral Take 2 tablets by mouth daily.    Marland Kitchen VITAMIN D 1000 UNITS PO CAPS Oral Take 2,000 Units by mouth daily.     . IBUPROFEN 200 MG PO TABS Oral Take 600 mg by mouth every 6 (six) hours as needed. For pain    . LEVOTHYROXINE SODIUM 50 MCG PO TABS Oral Take 1 tablet (50 mcg total) by mouth daily. 90 tablet 1  . NITROGLYCERIN 0.4 MG SL SUBL Sublingual Place  1 tablet (0.4 mg total) under the tongue every 5 (five) minutes as needed for chest pain (up to 3 doses). 25 tablet 3  . OMEPRAZOLE 20 MG PO CPDR Oral Take 20 mg by mouth daily.      BP 112/72  Pulse 98  Temp 97.1 F (36.2 C) (Oral)  Resp 18  SpO2 96%  Physical Exam  Nursing note and vitals reviewed. Constitutional: She appears well-developed and well-nourished. No distress.  HENT:  Head: Normocephalic and atraumatic.  Eyes:       Normal appearance  Neck: Normal range of motion.  Cardiovascular: Normal rate, regular rhythm and normal heart sounds.  Exam reveals no gallop and no friction rub.   No murmur heard. Pulmonary/Chest: Effort normal and breath sounds normal. She exhibits no tenderness.  Abdominal: Soft. Bowel sounds are normal. There is no tenderness. There is no rebound and no guarding.  Musculoskeletal: Normal range of motion.       Back:       Spine: No palpable stepoff, crepitus, or gross deformity appreciated. No appreciable spasm of paravertebral muscles. No midline tenderness. No palpable tenderness.  Neurological: She is alert.  Skin: Skin is warm and dry. She is not diaphoretic.  Psychiatric: She has a normal mood and affect.    ED Course  Procedures (including critical care time)   Date: 12/30/2011  Rate: 88  Rhythm: normal sinus rhythm  QRS Axis: normal  Intervals: normal  ST/T Wave abnormalities: normal  Conduction Disutrbances:none  Narrative Interpretation: slightly decreased voltages  Old EKG Reviewed: as compared with Jun 26, rate increased, 1st deg AVB on old not seen today  Labs Reviewed  PRO B NATRIURETIC PEPTIDE - Abnormal; Notable for the following:    Pro B Natriuretic peptide (BNP) 499.3 (*)     All other components within normal limits  CBC WITH DIFFERENTIAL - Abnormal; Notable for the following:    WBC 12.1 (*)     RBC 3.41 (*)     Hemoglobin 11.1 (*)     HCT 31.7 (*)     Neutrophils Relative 80 (*)     Neutro Abs 9.6 (*)      Lymphocytes Relative 10 (*)     Monocytes Absolute 1.2 (*)     All other components within normal limits  POCT I-STAT, CHEM 8 - Abnormal; Notable for the following:    Sodium 128 (*)     Chloride 94 (*)     BUN 34 (*)     Hemoglobin 11.2 (*)     HCT 33.0 (*)     All other components within normal limits  URINALYSIS, ROUTINE W REFLEX MICROSCOPIC   Dg Chest 2 View  12/30/2011  *RADIOLOGY REPORT*  Clinical Data: Chest pain.  CHEST - 2 VIEW  Comparison: Previous examinations, the most recent dated 12/18/2011.  Findings: Progressive enlargement of the cardiac silhouette with interval small bilateral pleural effusions.  The pulmonary vasculature and interstitial markings are less prominent.  Interval small amount of linear density in the left mid lung zone.  Mild scoliosis and mild thoracic spine degenerative changes.  Upper abdominal surgical clips on the right.  IMPRESSION:  1.  Progressive cardiomegaly with a somewhat globular configuration.  This raises the possibility of a pericardial effusion. 2.  Small bilateral pleural effusions. 3.  Resolved pulmonary vascular congestion and interstitial pulmonary edema. 4.  Interval small amount of linear atelectasis in the left mid lung zone.  Original Report Authenticated By: Darrol Angel, M.D.     1. Pericardial effusion       MDM  Patient presents with pain to her upper left back which started on Thursday and has been persistent since. She had similar pain before at which seemed to start in her chest and radiated to her back but has had workup from a cardiology standpoint which was negative. Patient has no reproducible tenderness on exam and again it does not reproduce with motion. Chest x-ray shows enlargement of the cardiac border which appears somewhat globular and raises concern for pericardial effusion. Findings discussed with Dr. Judd Lien, who contacted [facility] cardiology. Per their bedside ultrasound, it appears that she has a new pericardial  effusion which was not present on the Lexiscan on Monday. Patient will be admitted for further evaluation and management.       Grant Fontana, PA-C 12/30/11 1649

## 2011-12-30 NOTE — H&P (Signed)
Patient ID: Nancy West MRN: [id number], DOB/AGE: Jan 22, 1946   Admit date: 12/30/2011   Primary Physician: Carollee Herter, MD Primary Cardiologist: Katherina Right, MD  Pt. Profile:  66 y/o female with h/o chronic DOE, recently admitted with chest pain, d/c'd, and s/p normal MV on 7/3 who presents back to the ED with a 4 day history of scapular back pain, orthopnea, doe, edema, and 11 lb weight gain.  Problem List  Past Medical History  Diagnosis Date  . Obesity   . Hypertension   . Hyperlipidemia   . Diabetes mellitus     never on meds, A1c 7.0  . Hypothyroidism   . Venous stasis of lower extremity     a. chronic LEE  . Tobacco dependence     a. 46 pack yr hx.  . Chest pain     a. 12/2011 Neg Lexi MV, EF 56%;    Marland Kitchen Pericardial effusion     a. noted 12/30/2011  . DOE (dyspnea on exertion)     a. Chronic;  b. 02/2011 Echo: EF 55-60%, RV Sev Dil, RA mod dil, PASP    Past Surgical History  Procedure Date  . Cholecystectomy 1980  . Appendectomy   . Breast biopsy     a. Benign ~ 20 yr ago.     Allergies  Allergies  Allergen Reactions  . Augmentin (Amoxicillin-Pot Clavulanate)     Abdominal pain    HPI  66 y/o female with the above problem list.  She has chronic DOE, which she attributes to a 46 pack yr history of tobacco abuse (she cont to smoke 1ppd).  On 6/25, she had sudden onset of anterior chest pain and presented to the ED.  ECG was non-acute, CTA of the chest was negative for PE.  She was admitted, r/o, and d/c on 6/26.  Following d/c, she had no further chest pain and she was setup for and underwent a lexiscan MV on 7/3, which showed no evidence of ischemia or infarct and normal LV function.  On the evening of July 4th, she had sudden onset of mid to left scapular/back pain that was initially worse with deep breathing and lying flat.  She also noted orthopnea.  Since 7/4, she has had some progression of DOE along with increasing LEE and today she noted  that she was up 11lbs in weight.  Further, her scapular pain is more persistent, though there is less of a pleuritic component to it now.  She has been taking ibuprofen prn and though it initially was helping to stem scapular pain some, it isn't working as well now.  Because of her Ss, she presented back to the ED today.  Here, ECG was not acutely changed, though low voltage, and CXR showed progressive cardiomegaly with somewhat globular heart shape concerning for pericardial effusion.  Pt is currently in no acute distress and hemodynamically stable.  Bedside echo confirmed a large pericardial effusion.  She denies any recent viral illness, fever, chills, sick contacts, etc.  Home Medications  Prior to Admission medications   Medication Sig Start Date End Date Taking? Authorizing Provider  amLODipine (NORVASC) 5 MG tablet Take 1 tablet (5 mg total) by mouth daily. 09/13/11 09/12/12 Yes Joselyn Arrow, MD  aspirin 81 MG tablet Take 81 mg by mouth daily.     Yes Historical Provider, MD  atorvastatin (LIPITOR) 40 MG tablet Take 40 mg by mouth daily.   Yes Historical Provider, MD  benazepril-hydrochlorthiazide (LOTENSIN HCT) 20-12.5  MG per tablet Take 2 tablets by mouth daily.   Yes Historical Provider, MD  Cholecalciferol (VITAMIN D) 1000 UNITS capsule Take 2,000 Units by mouth daily.    Yes Historical Provider, MD  ibuprofen (ADVIL,MOTRIN) 200 MG tablet Take 600 mg by mouth every 6 (six) hours as needed. For pain   Yes Historical Provider, MD  levothyroxine (SYNTHROID, LEVOTHROID) 50 MCG tablet Take 1 tablet (50 mcg total) by mouth daily. 09/13/11  Yes Joselyn Arrow, MD  nitroGLYCERIN (NITROSTAT) 0.4 MG SL tablet Place 1 tablet (0.4 mg total) under the tongue every 5 (five) minutes as needed for chest pain (up to 3 doses). 12/19/11 12/18/12 Yes Jessica A Hope, PA-C  omeprazole (PRILOSEC) 20 MG capsule Take 20 mg by mouth daily.   Yes Historical Provider, MD    Family History  Family History  Problem Relation Age  of Onset  . Cancer Mother     breast cancer (70), colon cancer  . Diabetes Mother   . Hypertension Mother   . Diabetes Father     died in MVA  . Other Sister     alive and well   Social History  History   Social History  . Marital Status: Married    Spouse Name: N/A    Number of Children: 0  . Years of Education: N/A   Occupational History  . Retired (Presenter, broadcasting) [employer]   Social History Main Topics  . Smoking status: Current Everyday Smoker -- 1.0 packs/day for 46 years    Types: Cigarettes  . Smokeless tobacco: Never Used   Comment: 1 ppd since age 47; quit x 7 months with Chantix in past  . Alcohol Use: Yes     maybe 1 drink per week.  . Drug Use: No  . Sexually Active: Not Currently   Other Topics Concern  . Not on file   Social History Narrative   Lives in [place] with husband.  Cares for her elderly mother who lives next door    Review of Systems General:  No chills, fever, night sweats.  + 11 lb wt gain in 4 days.  Cardiovascular:  Had anterior c/p about 2 wks ago but now having mid to left scapular back pain with orthopnea, doe, and prog LEE.  No palpitations, paroxysmal nocturnal dyspnea. Dermatological: No rash, lesions/masses Respiratory: No cough.  Dyspnea as above. Urologic: No hematuria, dysuria though she has noted some decrease in her UO. Abdominal:   No nausea, vomiting, diarrhea, bright red blood per rectum, melena, or hematemesis Neurologic:  No visual changes, wkns, changes in mental status. All other systems reviewed and are otherwise negative except as noted above.  Physical Exam  Blood pressure 112/72, pulse 98, temperature 97.1 F (36.2 C), temperature source Oral, resp. rate 18, SpO2 96.00%.  General: Pleasant, NAD Psych: Normal affect. Neuro: Alert and oriented X 3. Moves all extremities spontaneously. HEENT: Normal  Neck: Supple without bruits.  Difficult to assess JVP secondary to obesity. Lungs:  Resp regular and  unlabored with scattered rhonchi and end exp wheezing throughout. Heart: RRR, diminished.  Rub noted. Abdomen: Soft, obese, non-tender, non-distended, BS + x 4.  Extremities: No clubbing, cyanosis.  Chronic venous stasis skin changes.  2+ bilat LE edema. DP/PT/Radials 2+ and equal bilaterally.  Labs  Lab Results  Component Value Date   WBC 12.1* 12/30/2011   HGB 11.2* 12/30/2011   HCT 33.0* 12/30/2011   MCV 93.0 12/30/2011   PLT 250 12/30/2011  Lab 12/30/11 1506  NA 128*  K 4.0  CL 94*  CO2 --  BUN 34*  CREATININE 1.00  CALCIUM --  PROT --  BILITOT --  ALKPHOS --  ALT --  AST --  GLUCOSE 94   pNBP 499.3  Radiology/Studies  Dg Chest 2 View  12/30/2011  *RADIOLOGY REPORT*  Clinical Data: Chest pain.  CHEST - 2 VIEW  Comparison: Previous examinations, the most recent dated 12/18/2011.  Findings: Progressive enlargement of the cardiac silhouette with interval small bilateral pleural effusions.  The pulmonary vasculature and interstitial markings are less prominent.  Interval small amount of linear density in the left mid lung zone.  Mild scoliosis and mild thoracic spine degenerative changes.  Upper abdominal surgical clips on the right.  IMPRESSION:  1.  Progressive cardiomegaly with a somewhat globular configuration.  This raises the possibility of a pericardial effusion. 2.  Small bilateral pleural effusions. 3.  Resolved pulmonary vascular congestion and interstitial pulmonary edema. 4.  Interval small amount of linear atelectasis in the left mid lung zone.  Original Report Authenticated By: Darrol Angel, M.D.   ECG  Rsr, 98, r-ward axis, non-specific t changes. - not dramatically changed from old ecg.  ASSESSMENT AND PLAN  1.  Acute Pericardial Effusion:  Pt with a 4 day h/o progressive DOE, LEE, orthopnea, and pleuritic chest pain.  CXR suggested and bedside echo confirmed, large pericardial effusion.  Pt currently in no acute distress and is hemodynamically stable.  We will  admit to stepdown, cycle ce, check sed rate, ppd, obtain formal echo in AM to better evaluate effusion and determine plan for further diagnostic evaluation.  Will give 1 dose of IV Lasix in setting of volume overload on exam.  2.  Acute CHF: Volume overload in setting of above.  Will give 1 dose of IV lasix in ED.  3.  HTN:  Follow.  4.  DM:  Diet controlled.  Add cbg's.  5.  tob abuse:  Cessation advised.   Signed, Nicolasa Ducking, NP 12/30/2011, 4:35 PM  History and all data above reviewed.  Patient examined.  I agree with the findings as above.  The patient presents with pain consistent with pericarditis. She has decreased voltage on her EKG and a bedside echo demonstrated circumferential pericardial effusion. I could not do a thorough enough exam to identify any tach or not this etiology. Her manual blood pressure was difficult to obtain and I didn't appreciate pulses. She has had acute on chronic shortness of breath and increasing lower extremity edema. She's also describing new orthopnea. The patient exam reveals COR: Distant heart sounds, no rub  ,  Lungs: Basilar depending crackles  ,  Abd: Positive bowel sounds, no rebound no guarding, Ext moderate bilateral lower extremity edema  .  All available labs, radiology testing, previous records reviewed. Agree with documented assessment and plan. The plan is as above. She will be admitted for a full echocardiogram. I'll start her on nonsteroidals and colchicine. I will give her one dose of diuretic tonight and this can be increased pending the hemodynamics identified on echo. We'll start a workup to include PPD, ESR, and rheumatologic labs.  Fayrene Fearing Armonte Tortorella  5:32 PM  12/30/2011

## 2011-12-30 NOTE — ED Notes (Addendum)
Pt states she has been seen over last several weeks by numerous doctors for chest/back pain. Pain in chest subsided, back pain started on 12/27/11 and is gradually increasing. Pt reports decrease in urine output since the pain started on Thursday with 11 lb weight gain over that time. Pt also reports increased nausea that comes and goes and increase in her acid reflux. She has recently started back on her Prilosec.

## 2011-12-30 NOTE — ED Notes (Signed)
Called floor to give report - RN to call back.

## 2011-12-30 NOTE — ED Notes (Signed)
Pt's CBG was 101 when I checked it. 6:21pm JG.

## 2011-12-31 DIAGNOSIS — I319 Disease of pericardium, unspecified: Secondary | ICD-10-CM

## 2011-12-31 DIAGNOSIS — I10 Essential (primary) hypertension: Secondary | ICD-10-CM

## 2011-12-31 DIAGNOSIS — I5032 Chronic diastolic (congestive) heart failure: Secondary | ICD-10-CM

## 2011-12-31 LAB — COMPREHENSIVE METABOLIC PANEL
AST: 16 U/L (ref 0–37)
Albumin: 3.3 g/dL — ABNORMAL LOW (ref 3.5–5.2)
CO2: 25 mEq/L (ref 19–32)
Calcium: 9.4 mg/dL (ref 8.4–10.5)
Creatinine, Ser: 1.03 mg/dL (ref 0.50–1.10)
GFR calc non Af Amer: 56 mL/min — ABNORMAL LOW (ref >90)

## 2011-12-31 LAB — GLUCOSE, CAPILLARY
Glucose-Capillary: 118 mg/dL — ABNORMAL HIGH (ref 70–99)
Glucose-Capillary: 111 mg/dL — ABNORMAL HIGH (ref 70–99)

## 2011-12-31 LAB — CBC
Hemoglobin: 10.1 g/dL — ABNORMAL LOW (ref 12.0–15.0)
MCH: 31.8 pg (ref 26.0–34.0)
RBC: 3.18 MIL/uL — ABNORMAL LOW (ref 3.87–5.11)
WBC: 11.5 10*3/uL — ABNORMAL HIGH (ref 4.0–10.5)

## 2011-12-31 LAB — APTT: aPTT: 38 seconds — ABNORMAL HIGH (ref 24–37)

## 2011-12-31 LAB — CARDIAC PANEL(CRET KIN+CKTOT+MB+TROPI)
CK, MB: 2.4 ng/mL (ref 0.3–4.0)
Total CK: 25 U/L (ref 7–177)

## 2011-12-31 MED ORDER — FUROSEMIDE 40 MG PO TABS
40.0000 mg | ORAL_TABLET | Freq: Every day | ORAL | Status: DC
  Administered 2011-12-31 – 2012-01-04 (×5): 40 mg via ORAL
  Filled 2011-12-31 (×5): qty 1

## 2011-12-31 MED ORDER — POTASSIUM CHLORIDE CRYS ER 20 MEQ PO TBCR
40.0000 meq | EXTENDED_RELEASE_TABLET | Freq: Once | ORAL | Status: AC
  Administered 2011-12-31: 40 meq via ORAL
  Filled 2011-12-31: qty 2

## 2011-12-31 NOTE — ED Provider Notes (Signed)
Medical screening examination/treatment/procedure(s) were conducted as a shared visit with non-physician practitioner(s) and myself.  I personally evaluated the patient during the encounter.  I saw and examined the patient along with C. Williams and agree with her note, assessment, and plan.  The patient presents complaining of pain in the upper back, "11 pound weight gain", and lower extremity swelling over the past week.  She was recently admitted for chest pain and had a lexiscan/myoview performed which was unremarkable.  She feels short of breath but denies fever, cough, or other complaints.  One exam, the vitals are stable and the patient is afebrile.  The heart sounds are distant and there are mild crackles in the bases.  She has 2-3+ pitting edema of the ble.  Workup was initiated including a chest xray, labs, and ekg.  The ekg showed low voltage, the lab work showed a bnp of 500, and the xray showed what appeared to be a enlarged cardiac silhouette compared with prior studies.  This raised the suspicion for pericardial effusion.  I spoke with Dr. Antoine Poche from [facility] who will come the ED to see the patient.  He brought along an ultrasound probe and confirmed the presence of a pericardial effusion.  She will be admitted to their service.    Geoffery Lyons, MD 12/31/11 [phone]

## 2011-12-31 NOTE — Progress Notes (Signed)
PROGRESS NOTE  Subjective:   Nancy West is a 66 yo with hx of smoking - admitted with several days of progressive dyspnea and CP.  She was found to have a  pericardial effusion by bedside echo.  She was started on colchicine and NSAIDs.  Objective:    Vital Signs:   Temp:  [97.1 F (36.2 C)-98 F (36.7 C)] 97.7 F (36.5 C) (07/08 0625) Pulse Rate:  [79-98] 83  (07/08 0625) Resp:  [17-18] 18  (07/08 0625) BP: (98-123)/(44-72) 100/44 mmHg (07/08 0625) SpO2:  [93 %-97 %] 93 % (07/08 0625) Weight:  [260 lb 2.3 oz (118 kg)-263 lb 10.7 oz (119.6 kg)] 260 lb 2.3 oz (118 kg) (07/08 0625)      24-hour weight change: Weight change:   Weight trends: Filed Weights   12/30/11 2034 12/31/11 0625  Weight: 263 lb 10.7 oz (119.6 kg) 260 lb 2.3 oz (118 kg)    Intake/Output:  07/07 0701 - 07/08 0700 In: -  Out: 825 [Urine:825]     Physical Exam: BP 100/44  Pulse 83  Temp 97.7 F (36.5 C) (Oral)  Resp 18  Ht 5\' 8"  (1.727 m)  Wt 260 lb 2.3 oz (118 kg)  BMI 39.55 kg/m2  SpO2 93%  General: Vital signs reviewed and noted. Well-developed, well-nourished, in no acute distress; alert, appropriate and cooperative .  Head: Normocephalic, atraumatic.  Eyes: conjunctivae/corneas clear.  EOM's intact.   Throat: normal  Neck: Supple. Normal carotids. No JVD  Lungs:  Clear to auscultation  Heart: Regular rate,  With normal  S1 S2. No murmurs, gallops or rubs  Abdomen:  Soft, non-tender, non-distended with normoactive bowel sounds. No hepatomegaly. No rebound/guarding. No abdominal masses.  Extremities: Distal pedal pulses are 2+ .  Chronic stasis changes, 1-2 + edema.   Neurologic: A&O X3, CN II - XII are grossly intact. Motor strength is 5/5 in the all 4 extremities.  Psych: Responds to questions appropriately with normal affect.    Labs: BMET:  Basename 12/30/11 1506  NA 128*  K 4.0  CL 94*  CO2 --  GLUCOSE 94  BUN 34*  CREATININE 1.00  CALCIUM --  MG --  PHOS --    Liver  function tests: No results found for this basename: AST:2,ALT:2,ALKPHOS:2,BILITOT:2,PROT:2,ALBUMIN:2 in the last 72 hours No results found for this basename: LIPASE:2,AMYLASE:2 in the last 72 hours  CBC:  Basename 12/30/11 1506 12/30/11 1459  WBC -- 12.1*  NEUTROABS -- 9.6*  HGB 11.2* 11.1*  HCT 33.0* 31.7*  MCV -- 93.0  PLT -- 250    Cardiac Enzymes:  Basename 12/31/11 0005 12/30/11 1758  CKTOTAL 25 29  CKMB 2.4 2.2  TROPONINI <0.30 <0.30    Coagulation Studies:  Basename 12/30/11 1758  LABPROT 14.4  INR 1.10     Basename 12/30/11 1758  TSH 2.410  T4TOTAL --  T3FREE --  THYROIDAB --   No results found for this basename: VITAMINB12,FOLATE,FERRITIN,TIBC,IRON,RETICCTPCT in the last 72 hours   Tele:  NSR  Medications:    Infusions:    Scheduled Medications:    . amLODipine  5 mg Oral Daily  . aspirin  81 mg Oral Daily  . atorvastatin  40 mg Oral Daily  . benazepril  20 mg Oral Daily   And  . hydrochlorothiazide  12.5 mg Oral Daily  . cholecalciferol  2,000 Units Oral Daily  . colchicine  0.6 mg Oral BID  . furosemide  20 mg Intravenous Once  . ibuprofen  600 mg Oral Q6H  . insulin aspart  0-15 Units Subcutaneous TID WC  . levothyroxine  50 mcg Oral Daily  .  morphine injection  4 mg Intramuscular Once  . ondansetron  4 mg Oral Once  . pantoprazole  40 mg Oral Q1200  . sodium chloride  3 mL Intravenous Q12H  . tuberculin  5 Units Intradermal Once  . DISCONTD: aspirin  81 mg Oral Daily  . DISCONTD: benazepril-hydrochlorthiazide  2 tablet Oral Daily  . DISCONTD:  morphine injection  4 mg Intravenous Once  . DISCONTD: ondansetron (ZOFRAN) IV  4 mg Intravenous Once  . DISCONTD: tuberculin  5 Units Intradermal Once  . DISCONTD: Vitamin D  2,000 Units Oral Daily    Assessment/ Plan:   1. Pericardial effusion:  She is feeling better after NSAID, colchicine, and Lasix.  Will continue.  She is a long time smoker and has an ESR of 93.  This is concerning.   The CT angio did not reveal any lung cancer but it was not a standard CT with contrast that we usually order to look for lung ca. For complete echo today.  2. Hyponatremia:  Possibly due to chronic HCTZ therapy.  - possible SIADH. May improve with diuresis.    3. Pulmonary hypertension - most likely due to COPD.    Disposition: echo today. Length of Stay: 1  Vesta Mixer, Montez Hageman., MD, [facility] 12/31/2011, 7:36 AM Office [phone] Pager [phone]

## 2011-12-31 NOTE — Progress Notes (Signed)
  Echocardiogram 2D Echocardiogram has been performed.  Nancy West [name] 12/31/2011, 3:30 PM

## 2012-01-01 ENCOUNTER — Inpatient Hospital Stay (HOSPITAL_COMMUNITY): Payer: PRIVATE HEALTH INSURANCE

## 2012-01-01 LAB — GLUCOSE, CAPILLARY: Glucose-Capillary: 122 mg/dL — ABNORMAL HIGH (ref 70–99)

## 2012-01-01 MED ORDER — IOHEXOL 300 MG/ML  SOLN
80.0000 mL | Freq: Once | INTRAMUSCULAR | Status: AC | PRN
  Administered 2012-01-01: 80 mL via INTRAVENOUS

## 2012-01-01 NOTE — Progress Notes (Addendum)
PROGRESS NOTE  Subjective:   Nancy West is a 66 yo with hx of smoking - admitted with several days of progressive dyspnea and CP.  She was found to have a  pericardial effusion by bedside echo.  She was started on colchicine and NSAIDs.  Regular echo yesterday confirmed the presence of a large pericardial effusion.    Objective:    Vital Signs:   Temp:  [97.8 F (36.6 C)-98.5 F (36.9 C)] 98.1 F (36.7 C) (07/09 0623) Pulse Rate:  [76-88] 76  (07/09 0623) Resp:  [16-18] 18  (07/09 0623) BP: (99-136)/(46-66) 113/56 mmHg (07/09 0623) SpO2:  [92 %-98 %] 97 % (07/09 0623) Weight:  [259 lb 4.8 oz (117.618 kg)] 259 lb 4.8 oz (117.618 kg) (07/09 0623)  Last BM Date: 12/29/11   24-hour weight change: Weight change: -4 lb 5.9 oz (-1.982 kg)  Weight trends: Filed Weights   12/30/11 2034 12/31/11 0625 01/01/12 0623  Weight: 263 lb 10.7 oz (119.6 kg) 260 lb 2.3 oz (118 kg) 259 lb 4.8 oz (117.618 kg)    Intake/Output:  07/08 0701 - 07/09 0700 In: 1320 [P.O.:1320] Out: 3150 [Urine:3150]     Physical Exam: BP 113/56  Pulse 76  Temp 98.1 F (36.7 C) (Oral)  Resp 18  Ht 5\' 8"  (1.727 m)  Wt 259 lb 4.8 oz (117.618 kg)  BMI 39.43 kg/m2  SpO2 97%  General: Vital signs reviewed and noted. Well-developed, well-nourished, in no acute distress; alert, appropriate and cooperative .  Head: Normocephalic, atraumatic.  Eyes: conjunctivae/corneas clear.  EOM's intact.   Throat: normal  Neck: Supple. Normal carotids. No JVD  Lungs:  Clear to auscultation  Heart: Regular rate,  With normal  S1 S2. There is a soft friction rub this am  Abdomen:  Soft, non-tender, non-distended with normoactive bowel sounds. No hepatomegaly. No rebound/guarding. No abdominal masses.  Extremities: Distal pedal pulses are 2+ .  Chronic stasis changes, 1-2 + edema.   Neurologic: A&O X3, CN II - XII are grossly intact. Motor strength is 5/5 in the all 4 extremities.  Psych: Responds to questions appropriately  with normal affect.    Labs: BMET:  Basename 12/31/11 0730 12/30/11 1506  NA 129* 128*  K 5.0 4.0  CL 93* 94*  CO2 25 --  GLUCOSE 102* 94  BUN 36* 34*  CREATININE 1.03 1.00  CALCIUM 9.4 --  MG -- --  PHOS -- --    Liver function tests:  Basename 12/31/11 0730  AST 16  ALT 12  ALKPHOS 164*  BILITOT 0.6  PROT 7.2  ALBUMIN 3.3*   No results found for this basename: LIPASE:2,AMYLASE:2 in the last 72 hours  CBC:  Basename 12/31/11 0730 12/30/11 1506 12/30/11 1459  WBC 11.5* -- 12.1*  NEUTROABS -- -- 9.6*  HGB 10.1* 11.2* --  HCT 29.8* 33.0* --  MCV 93.7 -- 93.0  PLT 254 -- 250    Cardiac Enzymes:  Basename 12/31/11 0730 12/31/11 0005 12/30/11 1758  CKTOTAL 31 25 29   CKMB 2.3 2.4 2.2  TROPONINI <0.30 <0.30 <0.30    Coagulation Studies:  Basename 12/30/11 1758  LABPROT 14.4  INR 1.10     Basename 12/30/11 1758  TSH 2.410  T4TOTAL --  T3FREE --  THYROIDAB --   No results found for this basename: VITAMINB12,FOLATE,FERRITIN,TIBC,IRON,RETICCTPCT in the last 72 hours   Tele:  NSR  Medications:    Infusions:    Scheduled Medications:    . amLODipine  5 mg  Oral Daily  . aspirin  81 mg Oral Daily  . atorvastatin  40 mg Oral Daily  . benazepril  20 mg Oral Daily   And  . hydrochlorothiazide  12.5 mg Oral Daily  . cholecalciferol  2,000 Units Oral Daily  . colchicine  0.6 mg Oral BID  . furosemide  40 mg Oral Daily  . ibuprofen  600 mg Oral Q6H  . insulin aspart  0-15 Units Subcutaneous TID WC  . levothyroxine  50 mcg Oral Daily  . pantoprazole  40 mg Oral Q1200  . potassium chloride  40 mEq Oral Once  . sodium chloride  3 mL Intravenous Q12H  . tuberculin  5 Units Intradermal Once    Assessment/ Plan:   1. Pericardial effusion:  She is feeling better after NSAID, colchicine, and Lasix.  Will continue.  She is a long time smoker and has an ESR of 93.  This is concerning.  The CT angio did not reveal any lung cancer but it was not a  standard CT with contrast that we usually order to look for lung ca.  We will get a CT chest with contrast today.    I discussed pericardiocentesis vs. Pericardial window with her.  Will see what the CT shows.     2. Hyponatremia:  Possibly due to chronic HCTZ therapy.  - possible SIADH. May improve with diuresis.    3. Pulmonary hypertension - most likely due to COPD.    Disposition: CT of chest today. Length of Stay: 2  Vesta Mixer, Montez Hageman., MD, [facility] 01/01/2012, 7:58 AM Office [phone] Pager [phone]   Addendum:  5:37 PM, 01/01/2012 The CT of the chest did not show any lung pathology.  Compared to the CT scan several weeks ago, the pericardial effusion has increased significantly.  I have scheduled her for a pericardiocentesis tomorrow. We have discussed the procedure.  Vesta Mixer, Montez Hageman., MD, [facility] 01/01/2012, 5:38 PM Office - [phone] Pager [phone]

## 2012-01-02 ENCOUNTER — Ambulatory Visit (HOSPITAL_COMMUNITY): Admit: 2012-01-02 | Payer: Self-pay | Admitting: Cardiovascular Disease

## 2012-01-02 ENCOUNTER — Encounter (HOSPITAL_COMMUNITY)
Admission: EM | Disposition: A | Discharge status: Home / Self Care | Payer: Self-pay | Source: Home / Self Care | Attending: Cardiology

## 2012-01-02 DIAGNOSIS — I3139 Other pericardial effusion (noninflammatory): Secondary | ICD-10-CM

## 2012-01-02 DIAGNOSIS — I4891 Unspecified atrial fibrillation: Secondary | ICD-10-CM

## 2012-01-02 DIAGNOSIS — I313 Pericardial effusion (noninflammatory): Secondary | ICD-10-CM

## 2012-01-02 HISTORY — PX: PERICARDIAL TAP: SHX5486

## 2012-01-02 LAB — BODY FLUID CELL COUNT WITH DIFFERENTIAL
Neutrophil Count, Fluid: 25 % (ref 0–25)
Total Nucleated Cell Count, Fluid: 800 cu mm (ref 0–1000)

## 2012-01-02 LAB — PROTEIN, BODY FLUID: Total protein, fluid: 5.6 g/dL

## 2012-01-02 LAB — GLUCOSE, CAPILLARY
Glucose-Capillary: 89 mg/dL (ref 70–99)
Glucose-Capillary: 114 mg/dL — ABNORMAL HIGH (ref 70–99)

## 2012-01-02 LAB — AMYLASE, BODY FLUID: Amylase, Fluid: 16 U/L

## 2012-01-02 LAB — GLUCOSE, SEROUS FLUID: Glucose, Fluid: 80 mg/dL

## 2012-01-02 SURGERY — PERICARDIAL TAP
Anesthesia: LOCAL

## 2012-01-02 MED ORDER — HEPARIN (PORCINE) IN NACL 2-0.9 UNIT/ML-% IJ SOLN
INTRAMUSCULAR | Status: AC
  Filled 2012-01-02: qty 1000

## 2012-01-02 MED ORDER — METOPROLOL TARTRATE 1 MG/ML IV SOLN
2.5000 mg | Freq: Once | INTRAVENOUS | Status: AC
  Administered 2012-01-02: 2.5 mg via INTRAVENOUS

## 2012-01-02 MED ORDER — MIDAZOLAM HCL 2 MG/2ML IJ SOLN
INTRAMUSCULAR | Status: AC
  Filled 2012-01-02: qty 2

## 2012-01-02 MED ORDER — DIAZEPAM 5 MG PO TABS
5.0000 mg | ORAL_TABLET | ORAL | Status: AC
  Administered 2012-01-02: 5 mg via ORAL
  Filled 2012-01-02: qty 1

## 2012-01-02 MED ORDER — FENTANYL CITRATE 0.05 MG/ML IJ SOLN
INTRAMUSCULAR | Status: AC
  Filled 2012-01-02: qty 2

## 2012-01-02 MED ORDER — SODIUM CHLORIDE 0.9 % IJ SOLN
3.0000 mL | Freq: Two times a day (BID) | INTRAMUSCULAR | Status: DC
  Administered 2012-01-02: 3 mL via INTRAVENOUS

## 2012-01-02 MED ORDER — DILTIAZEM LOAD VIA INFUSION
5.0000 mg | Freq: Once | INTRAVENOUS | Status: AC
  Administered 2012-01-02: 5 mg via INTRAVENOUS
  Filled 2012-01-02: qty 5

## 2012-01-02 MED ORDER — LIDOCAINE HCL (PF) 1 % IJ SOLN
INTRAMUSCULAR | Status: AC
  Filled 2012-01-02: qty 30

## 2012-01-02 MED ORDER — METOPROLOL TARTRATE 1 MG/ML IV SOLN
INTRAVENOUS | Status: AC
  Filled 2012-01-02: qty 5

## 2012-01-02 MED ORDER — SODIUM CHLORIDE 0.9 % IJ SOLN: 3.0000 mL | INTRAMUSCULAR | Status: DC | PRN

## 2012-01-02 MED ORDER — DILTIAZEM HCL 100 MG IV SOLR
10.0000 mg/h | INTRAVENOUS | Status: DC
  Administered 2012-01-02 (×2): 10 mg/h via INTRAVENOUS
  Administered 2012-01-02: 5 mg/h via INTRAVENOUS
  Administered 2012-01-02 – 2012-01-03 (×3): 10 mg/h via INTRAVENOUS
  Filled 2012-01-02 (×4): qty 100

## 2012-01-02 MED ORDER — DILTIAZEM HCL 25 MG/5ML IV SOLN: 5.0000 mg | Freq: Once | INTRAVENOUS | Status: DC

## 2012-01-02 MED ORDER — ASPIRIN 81 MG PO CHEW: 324.0000 mg | CHEWABLE_TABLET | ORAL | Status: DC

## 2012-01-02 MED ORDER — SODIUM CHLORIDE 0.9 % IV SOLN: 1.0000 mL/kg/h | INTRAVENOUS | Status: DC

## 2012-01-02 MED ORDER — SODIUM CHLORIDE 0.9 % IV SOLN
250.0000 mL | INTRAVENOUS | Status: DC | PRN
  Administered 2012-01-02: 250 mL via INTRAVENOUS

## 2012-01-02 NOTE — Interval H&P Note (Signed)
History and Physical Interval Note:  01/02/2012 3:16 PM  Nancy West  has presented today for surgery, with the diagnosis of Heart failure  The various methods of treatment have been discussed with the patient and family. After consideration of risks, benefits and other options for treatment, the patient has consented to  Procedure(s) (LRB): PERICARDIAL TAP (N/A) as a surgical intervention .  The patient's history has been reviewed, patient examined, no change in status, stable for surgery.  I have reviewed the patients' chart and labs.  Questions were answered to the patient's satisfaction.     MCALHANY,CHRISTOPHER

## 2012-01-02 NOTE — Progress Notes (Signed)
0123 Was notified by monitor tech that patient heart rhythm appeared to change from 1st degree heart block to atrial fibrillation / atrial flutter and HR was sustaining around 120's. 6045 Went to room to assess and obtained vital signs. Patient was asymptomatic, denies any symptoms. MD on call Dr.Turner was paged at [phone] and returned page at [phone] received new orders for cardizem drip and if HR did not begin to decrease within 30 to 45 minutes to call and notify again. Once medication and IV pump arrived patient was placed on drip. MD called and notified again that patient's HR was sustaining around 120's.Rate of cardizem drip was increased and one time  bolus given. Patient HR steadily decreased to 107-114 after receiving the cardizem bolus. However HR began to increase again this time to 131 and MD on call Dr. Mayford Knife was called again. Order for IV lopressor received and given to patient. Patient currently continues to deny any symptoms.

## 2012-01-02 NOTE — Care Management Note (Addendum)
    Page 1 of 1   01/04/2012     2:20:49 PM   CARE MANAGEMENT NOTE 01/04/2012  Patient:  Nancy West, Nancy West   Account Number:  [id number]  Date Initiated:  01/02/2012  Documentation initiated by:  CMA,WORKLIST  Subjective/Objective Assessment:   pericardial effusion     Action/Plan:   lives w husband   Anticipated DC Date:  01/04/2012   Anticipated DC Plan:  HOME/SELF CARE      DC Planning Services  CM consult  Medication Assistance      Choice offered to / List presented to:             Status of service:   Medicare Important Message given?   (If response is "NO", the following Medicare IM given date fields will be blank) Date Medicare IM given:   Date Additional Medicare IM given:    Discharge Disposition:  HOME/SELF CARE  Per UR Regulation:  Reviewed for med. necessity/level of care/duration of stay  If discussed at Long Length of Stay Meetings, dates discussed:    Comments:  7/12 14:19 debbie Francois Elk rn,bsn gave pt xarelto 5day free card. also gave pt pt assist form and community and other card to help w copays. getting cm sec to ck on copay for xarelto but wanted pt to have help if needed.  7/10 16:35 debbie Param Capri rn,bsn [id number]

## 2012-01-02 NOTE — H&P (View-Only) (Signed)
Patient Name: Nancy West Date of Encounter: 01/02/2012    Principal Problem:  *Acute pericardial effusion Active Problems:  Tobacco use disorder  Atrial fibrillation  Type II or unspecified type diabetes mellitus without mention of complication, not stated as uncontrolled  Essential hypertension, benign  Chronic diastolic heart failure  Pure hypercholesterolemia  Unspecified hypothyroidism  Unspecified vitamin D deficiency  Obesity (BMI 30-39.9)    SUBJECTIVE SOB with activity.  Denied chest pain.  For pericardiocentesis today.  Went into aflutter during night.  Currently rate controlled and asymptomatic.  CURRENT MEDS    . amLODipine  5 mg Oral Daily  . aspirin  324 mg Oral Pre-Cath  . aspirin  81 mg Oral Daily  . atorvastatin  40 mg Oral Daily  . benazepril  20 mg Oral Daily   And  . hydrochlorothiazide  12.5 mg Oral Daily  . cholecalciferol  2,000 Units Oral Daily  . colchicine  0.6 mg Oral BID  . diazepam  5 mg Oral On Call  . diltiazem  5 mg Intravenous Once  . furosemide  40 mg Oral Daily  . ibuprofen  600 mg Oral Q6H  . insulin aspart  0-15 Units Subcutaneous TID WC  . levothyroxine  50 mcg Oral Daily  . metoprolol      . metoprolol  2.5 mg Intravenous Once  . pantoprazole  40 mg Oral Q1200  . sodium chloride  3 mL Intravenous Q12H  . sodium chloride  3 mL Intravenous Q12H  . DISCONTD: diltiazem  5 mg Intravenous Once    OBJECTIVE  Filed Vitals:   01/01/12 2005 01/02/12 0120 01/02/12 0511 01/02/12 0639  BP: 114/76 134/71 107/71 104/60  Pulse: 76 132 109 73  Temp: 98.3 F (36.8 C) 97 F (36.1 C)  97.4 F (36.3 C)  TempSrc: Oral Oral  Oral  Resp: 19 18  18   Height:      Weight:    257 lb 4.8 oz (116.711 kg)  SpO2: 92% 97%  94%    Intake/Output Summary (Last 24 hours) at 01/02/12 0749 Last data filed at 01/02/12 0700  Gross per 24 hour  Intake 1649.92 ml  Output   3600 ml  Net -1950.08 ml   Filed Weights   12/31/11 0625 01/01/12  0623 01/02/12 0639  Weight: 260 lb 2.3 oz (118 kg) 259 lb 4.8 oz (117.618 kg) 257 lb 4.8 oz (116.711 kg)   PHYSICAL EXAM  General: Pleasant, NAD. Neuro: Alert and oriented X 3. Moves all extremities spontaneously. Psych: Normal affect. HEENT:  Normal  Neck: Supple without bruits . Lungs:  Resp regular and unlabored, CTA. Heart: irregular ,mild friction rub. Abdomen: Soft, non-tender, obese, BS + x 4.  Extremities: No clubbing, cyanosis or edema. Radials 2+ and equal bilaterally. Dorsalis pedis pulses 1+  Accessory Clinical Findings  CBC  Basename 12/31/11 0730 12/30/11 1506 12/30/11 1459  WBC 11.5* -- 12.1*  NEUTROABS -- -- 9.6*  HGB 10.1* 11.2* --  HCT 29.8* 33.0* --  MCV 93.7 -- 93.0  PLT 254 -- 250   Basic Metabolic Panel  Basename 12/31/11 0730 12/30/11 1506  NA 129* 128*  K 5.0 4.0  CL 93* 94*  CO2 25 --  GLUCOSE 102* 94  BUN 36* 34*  CREATININE 1.03 1.00  CALCIUM 9.4 --  MG -- --  PHOS -- --   Liver Function Tests  Basename 12/31/11 0730  AST 16  ALT 12  ALKPHOS 164*  BILITOT 0.6  PROT  7.2  ALBUMIN 3.3*   Cardiac Enzymes  Basename 12/31/11 0730 12/31/11 0005 12/30/11 1758  CKTOTAL 31 25 29   CKMB 2.3 2.4 2.2  CKMBINDEX -- -- --  TROPONINI <0.30 <0.30 <0.30   Thyroid Function Tests  Basename 12/30/11 1758  TSH 2.410  T4TOTAL --  T3FREE --  THYROIDAB --   TELE  Aflutter, variable conduction.  ECG  Aflutter, 133  Radiology/Studies  Dg Chest 2 View  12/30/2011  *RADIOLOGY REPORT*  Clinical Data: Chest pain.  CHEST - 2 VIEW  IMPRESSION:  1.  Progressive cardiomegaly with a somewhat globular configuration.  This raises the possibility of a pericardial effusion. 2.  Small bilateral pleural effusions. 3.  Resolved pulmonary vascular congestion and interstitial pulmonary edema. 4.  Interval small amount of linear atelectasis in the left mid lung zone.  Original Report Authenticated By: Darrol Angel, M.D.   2D Echocardiogram Study  Conclusions  - Left ventricle: The cavity size was normal. Wall thickness   was normal. Systolic function was vigorous. The estimated   ejection fraction was in the range of 65% to 70%. Regional   wall motion abnormalities cannot be excluded. - Left atrium: The atrium was mildly dilated. - Pericardium, extracardiac: A large, free-flowing   pericardial effusion was identified. The fluid had no   internal echoes. There was chamber collapse. Features were   consistent with mild tamponade physiology. _____________  ASSESSMENT AND PLAN  1. Acute pericardial effusion- Echo on 12/31/11 revealed large pericardial effusion and nl LV fxn.  Mild tamponade physiology.  She has mild doe and improved c/p on nsaids/colchicine.  For pericardiocentesis today.  PPD negative.  Chest CT on last admission did not show lung pathology.  2. AFlutter- new overnight.  Asymp.  Not a candidate for anticoagulation @ this time 2/2 effusion and pending pericardiocentesis.  TSH nl.  Cont dilt gtt for now.  Rate controlled.  3. Chronic diastolic CHF- NL EF by echo this admission.  Cont hr/bp control with low dose oral lasix.  Wt and edema down since admission.  4. HTN- stable on norvasc and HCTZ.  5. HLD- normal lipid profile 12/19/11. Continue statin  6. DM- on SSI  7. Tobacco Abuse- cessation advised.  Signed, Nicolasa Ducking NP  I have personally seen and examined this patient with Ward Givens, NP. I agree with the assessment and plan as outlined above. She has two issues today. The first is new onset atrial flutter which is rate controlled on IV diltiazem. The second is her large pericardial effusion with early tamponade. I have been asked to perform pericardiocentesis today. Risks and benefits reviewed with patient including bleeding, infection, damage to the myocardium, death. She agrees to proceed. Will plan for after lunch.   MCALHANY,CHRISTOPHER 10:19 AM 01/02/2012

## 2012-01-02 NOTE — Progress Notes (Signed)
 Patient Name: Nancy West Date of Encounter: 01/02/2012    Principal Problem:  *Acute pericardial effusion Active Problems:  Tobacco use disorder  Atrial fibrillation  Type II or unspecified type diabetes mellitus without mention of complication, not stated as uncontrolled  Essential hypertension, benign  Chronic diastolic heart failure  Pure hypercholesterolemia  Unspecified hypothyroidism  Unspecified vitamin D deficiency  Obesity (BMI 30-39.9)    SUBJECTIVE SOB with activity.  Denied chest pain.  For pericardiocentesis today.  Went into aflutter during night.  Currently rate controlled and asymptomatic.  CURRENT MEDS    . amLODipine  5 mg Oral Daily  . aspirin  324 mg Oral Pre-Cath  . aspirin  81 mg Oral Daily  . atorvastatin  40 mg Oral Daily  . benazepril  20 mg Oral Daily   And  . hydrochlorothiazide  12.5 mg Oral Daily  . cholecalciferol  2,000 Units Oral Daily  . colchicine  0.6 mg Oral BID  . diazepam  5 mg Oral On Call  . diltiazem  5 mg Intravenous Once  . furosemide  40 mg Oral Daily  . ibuprofen  600 mg Oral Q6H  . insulin aspart  0-15 Units Subcutaneous TID WC  . levothyroxine  50 mcg Oral Daily  . metoprolol      . metoprolol  2.5 mg Intravenous Once  . pantoprazole  40 mg Oral Q1200  . sodium chloride  3 mL Intravenous Q12H  . sodium chloride  3 mL Intravenous Q12H  . DISCONTD: diltiazem  5 mg Intravenous Once    OBJECTIVE  Filed Vitals:   01/01/12 2005 01/02/12 0120 01/02/12 0511 01/02/12 0639  BP: 114/76 134/71 107/71 104/60  Pulse: 76 132 109 73  Temp: 98.3 F (36.8 C) 97 F (36.1 C)  97.4 F (36.3 C)  TempSrc: Oral Oral  Oral  Resp: 19 18  18  Height:      Weight:    257 lb 4.8 oz (116.711 kg)  SpO2: 92% 97%  94%    Intake/Output Summary (Last 24 hours) at 01/02/12 0749 Last data filed at 01/02/12 0700  Gross per 24 hour  Intake 1649.92 ml  Output   3600 ml  Net -1950.08 ml   Filed Weights   12/31/11 0625 01/01/12  0623 01/02/12 0639  Weight: 260 lb 2.3 oz (118 kg) 259 lb 4.8 oz (117.618 kg) 257 lb 4.8 oz (116.711 kg)   PHYSICAL EXAM  General: Pleasant, NAD. Neuro: Alert and oriented X 3. Moves all extremities spontaneously. Psych: Normal affect. HEENT:  Normal  Neck: Supple without bruits . Lungs:  Resp regular and unlabored, CTA. Heart: irregular ,mild friction rub. Abdomen: Soft, non-tender, obese, BS + x 4.  Extremities: No clubbing, cyanosis or edema. Radials 2+ and equal bilaterally. Dorsalis pedis pulses 1+  Accessory Clinical Findings  CBC  Basename 12/31/11 0730 12/30/11 1506 12/30/11 1459  WBC 11.5* -- 12.1*  NEUTROABS -- -- 9.6*  HGB 10.1* 11.2* --  HCT 29.8* 33.0* --  MCV 93.7 -- 93.0  PLT 254 -- 250   Basic Metabolic Panel  Basename 12/31/11 0730 12/30/11 1506  NA 129* 128*  K 5.0 4.0  CL 93* 94*  CO2 25 --  GLUCOSE 102* 94  BUN 36* 34*  CREATININE 1.03 1.00  CALCIUM 9.4 --  MG -- --  PHOS -- --   Liver Function Tests  Basename 12/31/11 0730  AST 16  ALT 12  ALKPHOS 164*  BILITOT 0.6  PROT   7.2  ALBUMIN 3.3*   Cardiac Enzymes  Basename 12/31/11 0730 12/31/11 0005 12/30/11 1758  CKTOTAL 31 25 29  CKMB 2.3 2.4 2.2  CKMBINDEX -- -- --  TROPONINI <0.30 <0.30 <0.30   Thyroid Function Tests  Basename 12/30/11 1758  TSH 2.410  T4TOTAL --  T3FREE --  THYROIDAB --   TELE  Aflutter, variable conduction.  ECG  Aflutter, 133  Radiology/Studies  Dg Chest 2 View  12/30/2011  *RADIOLOGY REPORT*  Clinical Data: Chest pain.  CHEST - 2 VIEW  IMPRESSION:  1.  Progressive cardiomegaly with a somewhat globular configuration.  This raises the possibility of a pericardial effusion. 2.  Small bilateral pleural effusions. 3.  Resolved pulmonary vascular congestion and interstitial pulmonary edema. 4.  Interval small amount of linear atelectasis in the left mid lung zone.  Original Report Authenticated By: STEVEN H. REID, M.D.   2D Echocardiogram Study  Conclusions  - Left ventricle: The cavity size was normal. Wall thickness   was normal. Systolic function was vigorous. The estimated   ejection fraction was in the range of 65% to 70%. Regional   wall motion abnormalities cannot be excluded. - Left atrium: The atrium was mildly dilated. - Pericardium, extracardiac: A large, free-flowing   pericardial effusion was identified. The fluid had no   internal echoes. There was chamber collapse. Features were   consistent with mild tamponade physiology. _____________  ASSESSMENT AND PLAN  1. Acute pericardial effusion- Echo on 12/31/11 revealed large pericardial effusion and nl LV fxn.  Mild tamponade physiology.  She has mild doe and improved c/p on nsaids/colchicine.  For pericardiocentesis today.  PPD negative.  Chest CT on last admission did not show lung pathology.  2. AFlutter- new overnight.  Asymp.  Not a candidate for anticoagulation @ this time 2/2 effusion and pending pericardiocentesis.  TSH nl.  Cont dilt gtt for now.  Rate controlled.  3. Chronic diastolic CHF- NL EF by echo this admission.  Cont hr/bp control with low dose oral lasix.  Wt and edema down since admission.  4. HTN- stable on norvasc and HCTZ.  5. HLD- normal lipid profile 12/19/11. Continue statin  6. DM- on SSI  7. Tobacco Abuse- cessation advised.  Signed, Christopher Berge NP  I have personally seen and examined this patient with Chris Berge, NP. I agree with the assessment and plan as outlined above. She has two issues today. The first is new onset atrial flutter which is rate controlled on IV diltiazem. The second is her large pericardial effusion with early tamponade. I have been asked to perform pericardiocentesis today. Risks and benefits reviewed with patient including bleeding, infection, damage to the myocardium, death. She agrees to proceed. Will plan for after lunch.   MCALHANY,CHRISTOPHER 10:19 AM 01/02/2012  

## 2012-01-02 NOTE — Progress Notes (Signed)
Pt received  TB skin test on 12/31/11 left forearm. Today area noted to have no reaction. No redness or swelling. Area circled

## 2012-01-02 NOTE — CV Procedure (Signed)
   Cardiac Catheterization Operative Report  Nancy West [id number] 7/10/20133:49 PM Carollee Herter, MD  Procedure Performed:  1. Pericardiocentesis/Sub-xiphoid approach  Operator: Verne Carrow, MD  Indication: Pt with large pericardial effusion with early tamponade.                                        Procedure Details: The risks, benefits, complications, treatment options, and expected outcomes were discussed with the patient. The patient and/or family concurred with the proposed plan, giving informed consent. The patient was further sedated with Versed and Fentanyl. The chest wall was prepped and draped in a sterile fashion. 1% lidocaine was used for local anesthesia. I then used a long needle with a stylet in place and advanced under the xiphoid process. I was able to advance this easily into the pericardial space. I then advanced a wire into the pericardial space and removed the needle. A drain was inserted over the wire. 600 cc of bloody fluid was removed and sent to the lab for fluid studies. She had no complaints. The catheter was sewn in and hooked to a bag.   There were no immediate complications. The patient was taken to the recovery area in stable condition.   Hemodynamic Findings: Central aortic pressure: 117/64  Impression: 1. Pericardial effusion with cardiac tamponade now s/p successful pericardiocentesis with removal of 600cc bloody pericardial fluid.    Recommendations: Will leave drain in place overnight and remove in am. Will follow fluid analysis.        Complications:  None; patient tolerated the procedure well.

## 2012-01-03 DIAGNOSIS — I319 Disease of pericardium, unspecified: Secondary | ICD-10-CM

## 2012-01-03 DIAGNOSIS — I309 Acute pericarditis, unspecified: Secondary | ICD-10-CM

## 2012-01-03 LAB — BASIC METABOLIC PANEL
BUN: 26 mg/dL — ABNORMAL HIGH (ref 6–23)
Calcium: 9.2 mg/dL (ref 8.4–10.5)
GFR calc non Af Amer: 60 mL/min — ABNORMAL LOW (ref >90)
Glucose, Bld: 92 mg/dL (ref 70–99)
Sodium: 134 mEq/L — ABNORMAL LOW (ref 135–145)

## 2012-01-03 LAB — GLUCOSE, CAPILLARY
Glucose-Capillary: 81 mg/dL (ref 70–99)
Glucose-Capillary: 111 mg/dL — ABNORMAL HIGH (ref 70–99)
Glucose-Capillary: 112 mg/dL — ABNORMAL HIGH (ref 70–99)

## 2012-01-03 LAB — PH, BODY FLUID: pH, Fluid: 8

## 2012-01-03 MED ORDER — RIVAROXABAN 20 MG PO TABS
20.0000 mg | ORAL_TABLET | Freq: Every day | ORAL | Status: DC
  Filled 2012-01-03: qty 1

## 2012-01-03 MED ORDER — DILTIAZEM HCL 60 MG PO TABS
60.0000 mg | ORAL_TABLET | Freq: Four times a day (QID) | ORAL | Status: AC
  Administered 2012-01-03 (×2): 60 mg via ORAL
  Filled 2012-01-03 (×2): qty 1

## 2012-01-03 MED ORDER — DILTIAZEM HCL ER COATED BEADS 240 MG PO CP24
240.0000 mg | ORAL_CAPSULE | Freq: Every day | ORAL | Status: DC
  Administered 2012-01-04: 240 mg via ORAL
  Filled 2012-01-03: qty 1

## 2012-01-03 NOTE — Progress Notes (Signed)
Updated status  The echo showed only a small pericardial effusion.  The drain did not drain any fluid since 8 AM.    There pericardial drain was pulled without any complications.  I called pathology.  The cytology was negative for cancer cells - only inflammation.  Hopefull this inflammatory pericarditis will be adequatly controlled with motrin and colchicine.  2. A-Fib:  Rate is well controlled on Dilt drip.  Will transition to tabs- regular diltiazem today and Cardizem CD starting tomorrow AM Will DC drip at 6PM. Will start anticoagulation tomorrow.  I would like for her pericardial drain to be out for 24 hours prior to starting xarelto.  Vesta Mixer, Montez Hageman., MD, [facility] 01/03/2012, 3:21 PM Office - [phone] Pager [phone]

## 2012-01-03 NOTE — Progress Notes (Signed)
TB SKIN TEST :  Patient has area marked on left inner aspect of lower arm for TB skin test. Site is clean dry and intact. No redness or elevated area  noted.

## 2012-01-03 NOTE — Progress Notes (Signed)
  Echocardiogram  Limited 2D Echocardiogram has been performed.  Nancy West 01/03/2012, 9:48 AM

## 2012-01-03 NOTE — Progress Notes (Addendum)
PROGRESS NOTE  Subjective:   Nancy West is a 66 yo with hx of smoking - admitted with several days of progressive dyspnea and CP.  She was found to have a  pericardial effusion by bedside echo.  She was started on colchicine and NSAIDs.  Regular echo yesterday confirmed the presence of a large pericardial effusion.  She had a successful pericardiocentesis yesterday which drained > 600 cc of blood tinged fluid.  Objective:    Vital Signs:   Temp:  [97.5 F (36.4 C)-98.5 F (36.9 C)] 97.5 F (36.4 C) (07/11 0753) Pulse Rate:  [64-105] 70  (07/11 0404) Resp:  [13-25] 18  (07/11 0753) BP: (87-122)/(46-63) 98/48 mmHg (07/11 0753) SpO2:  [87 %-98 %] 96 % (07/11 0404)  Last BM Date: 12/29/11   24-hour weight change: Weight change:   Weight trends: Filed Weights   12/31/11 0625 01/01/12 0623 01/02/12 0639  Weight: 260 lb 2.3 oz (118 kg) 259 lb 4.8 oz (117.618 kg) 257 lb 4.8 oz (116.711 kg)    Intake/Output:  07/10 0701 - 07/11 0700 In: 1746.7 [P.O.:960; I.V.:786.7] Out: 4035 [Urine:3725; Drains:310]     Physical Exam: BP 98/48  Pulse 70  Temp 97.5 F (36.4 C) (Oral)  Resp 18  Ht 5\' 8"  (1.727 m)  Wt 257 lb 4.8 oz (116.711 kg)  BMI 39.12 kg/m2  SpO2 96%  General: Vital signs reviewed and noted. Well-developed, well-nourished, in no acute distress; alert, appropriate and cooperative .  Head: Normocephalic, atraumatic.  Eyes: conjunctivae/corneas clear.  EOM's intact.   Throat: normal  Neck: Supple. Normal carotids. No JVD  Lungs:  Clear to auscultation  Heart: Regular rate,  With normal  S1 S2. There is a soft friction rub this am. Pericardial drain is in place.  Abdomen:  Soft, non-tender, non-distended with normoactive bowel sounds. No hepatomegaly. No rebound/guarding. No abdominal masses.  Extremities: Distal pedal pulses are 2+ .  Chronic stasis changes, 1-2 + edema.   Neurologic: A&O X3, CN II - XII are grossly intact. Motor strength is 5/5 in the all 4  extremities.  Psych: Responds to questions appropriately with normal affect.    Labs: BMET:  Basename 01/03/12 0519  NA 134*  K 3.7  CL 93*  CO2 30  GLUCOSE 92  BUN 26*  CREATININE 0.98  CALCIUM 9.2  MG --  PHOS --    Liver function tests: No results found for this basename: AST:2,ALT:2,ALKPHOS:2,BILITOT:2,PROT:2,ALBUMIN:2 in the last 72 hours No results found for this basename: LIPASE:2,AMYLASE:2 in the last 72 hours  CBC: No results found for this basename: WBC:2,NEUTROABS:2,HGB:2,HCT:2,MCV:2,PLT:2 in the last 72 hours  Cardiac Enzymes: No results found for this basename: CKTOTAL:4,CKMB:4,TROPONINI:4 in the last 72 hours  Coagulation Studies: No results found for this basename: LABPROT:5,INR:5 in the last 72 hours  No results found for this basename: TSH,T4TOTAL,FREET3,T3FREE,THYROIDAB in the last 72 hours No results found for this basename: VITAMINB12,FOLATE,FERRITIN,TIBC,IRON,RETICCTPCT in the last 72 hours   Tele:  NSR  Medications:    Infusions:    . diltiazem (CARDIZEM) infusion 10 mg/hr (01/03/12 0507)  . DISCONTD: sodium chloride      Scheduled Medications:    . amLODipine  5 mg Oral Daily  . aspirin  81 mg Oral Daily  . atorvastatin  40 mg Oral Daily  . benazepril  20 mg Oral Daily   And  . hydrochlorothiazide  12.5 mg Oral Daily  . cholecalciferol  2,000 Units Oral Daily  . colchicine  0.6 mg Oral BID  .  diazepam  5 mg Oral On Call  . fentaNYL      . furosemide  40 mg Oral Daily  . heparin      . ibuprofen  600 mg Oral Q6H  . insulin aspart  0-15 Units Subcutaneous TID WC  . levothyroxine  50 mcg Oral Daily  . lidocaine      . midazolam      . pantoprazole  40 mg Oral Q1200  . sodium chloride  3 mL Intravenous Q12H  . DISCONTD: aspirin  324 mg Oral Pre-Cath  . DISCONTD: sodium chloride  3 mL Intravenous Q12H    Assessment/ Plan:   1. Pericardial effusion:  She is feeling better after drainage of the effusion.  The drain has slowed  significantly.  Will pull the drain this afternoon if there has been no further drainage.  Will get echo.    2. Hyponatremia:  Possibly due to chronic HCTZ therapy.  - possible SIADH. May improve with diuresis.  Her sodium is much better this am.  3. Pulmonary hypertension - most likely due to COPD.    4. Atrial fibrillation:  She developed A-fib with controlled rate.  She is asymptomatic.  She has hx of diastolic CHF. I would favor anticoagulation with Xarelto .  I would like to wait several days to ensure that her peridcardial effusion has resolved and does not recurr.  I would favor 1 month of anticoagulation and then attempt cardioversion  5. Cigarette smoking: I have encouraged her to stop smoking  Disposition: echo today.  Pull pericardial drain if the effusion has resolved by echo. Length of Stay: 4  Vesta Mixer, Montez Hageman., MD, [facility] 01/03/2012, 8:11 AM Office [phone] Pager [phone]

## 2012-01-04 ENCOUNTER — Encounter (HOSPITAL_COMMUNITY): Payer: Self-pay | Admitting: Physician Assistant

## 2012-01-04 ENCOUNTER — Encounter: Payer: PRIVATE HEALTH INSURANCE | Admitting: Nurse Practitioner

## 2012-01-04 DIAGNOSIS — F17200 Nicotine dependence, unspecified, uncomplicated: Secondary | ICD-10-CM

## 2012-01-04 DIAGNOSIS — E7800 Pure hypercholesterolemia, unspecified: Secondary | ICD-10-CM

## 2012-01-04 LAB — CBC
HCT: 32.5 % — ABNORMAL LOW (ref 36.0–46.0)
Hemoglobin: 11.4 g/dL — ABNORMAL LOW (ref 12.0–15.0)
MCH: 32.7 pg (ref 26.0–34.0)
MCHC: 35.1 g/dL (ref 30.0–36.0)
MCV: 93.1 fL (ref 78.0–100.0)

## 2012-01-04 MED ORDER — POTASSIUM CHLORIDE CRYS ER 10 MEQ PO TBCR
10.0000 meq | EXTENDED_RELEASE_TABLET | Freq: Every day | ORAL | Status: DC
  Administered 2012-01-04: 10 meq via ORAL
  Filled 2012-01-04: qty 1

## 2012-01-04 MED ORDER — DILTIAZEM HCL ER COATED BEADS 240 MG PO CP24: 240.0000 mg | ORAL_CAPSULE | Freq: Every day | ORAL | Status: DC

## 2012-01-04 MED ORDER — IBUPROFEN 200 MG PO TABS: 200.0000 mg | ORAL_TABLET | Freq: Two times a day (BID) | ORAL | Status: DC

## 2012-01-04 MED ORDER — POTASSIUM CHLORIDE CRYS ER 10 MEQ PO TBCR: 10.0000 meq | EXTENDED_RELEASE_TABLET | Freq: Every day | ORAL | Status: DC

## 2012-01-04 MED ORDER — COLCHICINE 0.6 MG PO TABS: 0.6000 mg | ORAL_TABLET | Freq: Two times a day (BID) | ORAL | Status: DC

## 2012-01-04 MED ORDER — BENAZEPRIL HCL 20 MG PO TABS: 20.0000 mg | ORAL_TABLET | Freq: Every day | ORAL | Status: DC

## 2012-01-04 MED ORDER — FUROSEMIDE 40 MG PO TABS: 40.0000 mg | ORAL_TABLET | Freq: Every day | ORAL | Status: DC

## 2012-01-04 MED ORDER — RIVAROXABAN 20 MG PO TABS: 20.0000 mg | ORAL_TABLET | Freq: Every day | ORAL | Status: DC

## 2012-01-04 NOTE — Discharge Summary (Signed)
Discharge Summary   Patient ID: Nancy West MRN: [id number], DOB/AGE: 1945/07/11 66 y.o. Admit date: 12/30/2011 D/C date:     01/04/2012  Primary Cardiologist: Dr. Elease Hashimoto  Primary Discharge Diagnoses:  1. Large pericardial effusion, possibly inflammatory - s/p pericardiocentesis 01/02/12 yielding fluid (neg malignant cells, only inflammatory cells) 2. Atrial fib/flutter - initated on Xarelto 3. Acute on chronic diastolic CHF 4. Tobacco abuse 5. Anemia, Hgb 11.4 at dc  Secondary Discharge Diagnoses:  1. HTN 2. HL 3. Diabetes mellitus, diet controlled 4. Hypothyroidism 5. Venous stasis of LE 6. Obesity  Hospital Course: 66 y/o F with hx HTN, HLD, tobacco abuse presented to [facility] with a 4-day history of scapular back pain, orthopnea, DOE, edema, and 11lb weight gain. She was recently admitted at the end of June for CP, ruled out for MI and PE and had a normal outpatient Myoview on 12/26/11. She returned 7/7 with the above complaints. On July 4th, she had sudden onset of mid to left scapular/back pain that was initially worse with deep breathing and lying flat. She also reported progression of DOE with increasing LEE since that time. She had been taking ibuprofen PRN and thought it was initially helping her scapular pain, but it wasn't working well on the day leading up to admission. Her EKG was not acutely changed on arrival but did show low-voltage. CXR showed progressive cardiomegaly with somewhat globular heart shape concerning for pericardial effusion. Bedside echo confirmed a large pericardial effusion.  She denies any recent viral illness, fever, chills, sick contacts. She was hemodynamically stable. WBC was elevated at 12.1, Na 128, pBNP 499. She was given 1 dose of IV Lasix given volume overload on exam. She was started on colchicine and NSAIDs. These measures improved her symptoms. ESR was elevated at 93. PPD was negative. Her hyponatremia was possibly due to  chronic HCTZ use vs SIADH, and improved with further diuresis. Complete 2D echo was obtained EF 65-70%, large free-flowing pericardial effusion. Features were consistent with mild tamponade physiology.   Given her long hx of tobacco, CT of chest was obtained to rule out lung malignancy and did not show evidence of such. On 01/02/12, it was noted the patient's rhythm had changed to atrial flutter . IV cardizem was initiated which improved rate but still remained high so IV lopressor was added. She underwent successful pericardiocentesis via subxiphoid approach 01/02/12 with removal of 600cc bloody pericardial fluid. Cytology was negative for cancer cells, only inflammation - her pericarditis was felt inflammatory in nature. F/u echo showed small pericardial effusion, did not appear to be hemodynamically destabilizing. Her drain was able to be pulled. In regards to her atrial fib, she converted to NSR. Dr. Elease Hashimoto initiated Xarelto after a period of waiting post-drain pull. He recommended to continue for 3-6 months and monitor for further episodes of AF, as she may have PAF all the time or there is a chance this was related to the pericarditis. Today the patient is feeling well. See below for his medication recommendations (includes colchine 0.6mg  BID x 1-2 months, Xarelto 20mg  x 3-6 months, Motrin 200-400mg  BID x 3-6 months). Per discussion with Dr. Elease Hashimoto, ASA will be discontinued while she is on Xarelto. CBC was obtained prior to DC showing Hgb 11.4, stable from prior. Dr. Elease Hashimoto has seen and examined her and feels she is stable for discharge.  Discharge Vitals: Blood pressure 109/50, pulse 79, temperature 98 F (36.7 C), temperature source Oral, resp. rate 10, height  5\' 8"  (1.727 m), weight 257 lb 4.8 oz (116.711 kg), SpO2 94.00%.  Labs: Lab Results  Component Value Date   WBC 9.4 01/04/2012   HGB 11.4* 01/04/2012   HCT 32.5* 01/04/2012   MCV 93.1 01/04/2012   PLT 286 01/04/2012     Lab 01/03/12 0519  12/31/11 0730  NA 134* --  K 3.7 --  CL 93* --  CO2 30 --  BUN 26* --  CREATININE 0.98 --  CALCIUM 9.2 --  PROT -- 7.2  BILITOT -- 0.6  ALKPHOS -- 164*  ALT -- 12  AST -- 16  GLUCOSE 92 --    Diagnostic Studies/Procedures   1.  Chest 2 View 12/30/2011  *RADIOLOGY REPORT*  Clinical Data: Chest pain.  CHEST - 2 VIEW  Comparison: Previous examinations, the most recent dated 12/18/2011.  Findings: Progressive enlargement of the cardiac silhouette with interval small bilateral pleural effusions.  The pulmonary vasculature and interstitial markings are less prominent.  Interval small amount of linear density in the left mid lung zone.  Mild scoliosis and mild thoracic spine degenerative changes.  Upper abdominal surgical clips on the right.  IMPRESSION:  1.  Progressive cardiomegaly with a somewhat globular configuration.  This raises the possibility of a pericardial effusion. 2.  Small bilateral pleural effusions. 3.  Resolved pulmonary vascular congestion and interstitial pulmonary edema. 4.  Interval small amount of linear atelectasis in the left mid lung zone.  Original Report Authenticated By: Darrol Angel, M.D.   2. Ct Chest W Contrast 01/01/2012  *RADIOLOGY REPORT*  Clinical Data: Weight gain.  Shortness of breath.  Pericardial effusion.  CT CHEST WITH CONTRAST  Technique:  Multidetector CT imaging of the chest was performed following the standard protocol during bolus administration of intravenous contrast.  Contrast: 80mL OMNIPAQUE IOHEXOL 300 MG/ML  SOLN  Comparison: Multiple exams, including 12/30/2011 and 12/18/2011  Findings: A large pericardial effusion has developed since the CT scan from 12/18/2011.  No obvious soft tissue nodularity along the pericardial fluid is observed.  Small mediastinal lymph nodes are not pathologically enlarged by size criteria.  Atherosclerotic calcification of the thoracic aorta noted with stable underlying cardiomegaly.  Small left and trace right pleural  effusions are new compared to the prior CT scan.  There is mild atelectasis in the right middle lobe, lingula, and left lower lobe  IMPRESSION:  1.  New very large pericardial effusion compared to 12/18/2011. Careful clinical assessment and observation recommended to exclude tamponade. 2.  Small left and trace right pleural effusion with passive atelectasis.  There is also mild atelectasis in the lingula and right middle lobe. 3.  Stable aortic atherosclerosis. 4.  Cardiomegaly.  Original Report Authenticated By: Dellia Cloud, M.D.   3. 2D Echo 12/31/11 Study Conclusions  - Left ventricle: The cavity size was normal. Wall thickness was normal. Systolic function was vigorous. The estimated ejection fraction was in the range of 65% to 70%. Regional wall motion abnormalities cannot be excluded. - Left atrium: The atrium was mildly dilated. - Pericardium, extracardiac: A large, free-flowing pericardial effusion was identified. The fluid had no internal echoes. There was chamber collapse. Features were consistent with mild tamponade physiology.  4. 2D Echo 01/03/12 Study Conclusions - Left ventricle: The cavity size was normal. Wall thickness was normal. Systolic function was normal. The estimated ejection fraction was in the range of 55% to 60%. - Mitral valve: Calcified annulus. Mildly thickened leaflets . - Right ventricle: RVEF is mildly depressed with  apical hypokinesis. The cavity size was mildly dilated.  - Pericardium, extracardiac: Small pericardial effusion surrounds heart. Does not appear to be hemodynamically destabilizing by echo criteria. Impressions: - Doppler interrogation of valves was not done.  Discharge Medications   Medication List  As of 01/04/2012  4:10 PM   STOP taking these medications         amLODipine 5 MG tablet      aspirin 81 MG tablet      benazepril-hydrochlorthiazide 20-12.5 MG per tablet         TAKE these medications         atorvastatin 40 MG tablet    Commonly known as: LIPITOR   Take 40 mg by mouth daily.      benazepril 20 MG tablet   Commonly known as: LOTENSIN   Take 1 tablet (20 mg total) by mouth daily.      colchicine 0.6 MG tablet   Take 1 tablet (0.6 mg total) by mouth 2 (two) times daily.      diltiazem 240 MG 24 hr capsule   Commonly known as: CARDIZEM CD   Take 1 capsule (240 mg total) by mouth daily.      furosemide 40 MG tablet   Commonly known as: LASIX   Take 1 tablet (40 mg total) by mouth daily.      ibuprofen 200 MG tablet   Commonly known as: ADVIL,MOTRIN   Take 1-2 tablets (200-400 mg total) by mouth 2 (two) times daily.      levothyroxine 50 MCG tablet   Commonly known as: SYNTHROID, LEVOTHROID   Take 1 tablet (50 mcg total) by mouth daily.      nitroGLYCERIN 0.4 MG SL tablet   Commonly known as: NITROSTAT   Place 1 tablet (0.4 mg total) under the tongue every 5 (five) minutes as needed for chest pain (up to 3 doses).      omeprazole 20 MG capsule   Commonly known as: PRILOSEC   Take 20 mg by mouth daily.      potassium chloride 10 MEQ tablet   Commonly known as: K-DUR,KLOR-CON   Take 1 tablet (10 mEq total) by mouth daily.      Rivaroxaban 20 MG Tabs   Commonly known as: XARELTO   Take 1 tablet (20 mg total) by mouth daily after supper.      Vitamin D 1000 UNITS capsule   Take 2,000 Units by mouth daily.          see above re: durations of colchicine, xarelto, motrin  Disposition   The patient will be discharged in stable condition to home. Discharge Orders    Future Appointments: Provider: Department: Dept Phone: Center:   01/21/2012 2:30 PM [facility] [phone] None   01/21/2012 3:00 PM Rosalio Macadamia, NP [facility] [phone] None   03/19/2012 1:30 PM Joselyn Arrow, MD [facility] [phone] [facility]     Future Orders Please Complete By Expires   Diet - low sodium heart healthy      Increase activity slowly      Comments:   You  may drive. Increase activity slowly. Keep procedure site clean & dry. Please keep a Band-Aid over the site for the first couple days at home (change regularly). If you notice increased pain, swelling, bleeding or pus, call/return!  You may shower, but no soaking baths/hot tubs/pools/lakes for 4 weeks.     Follow-up Information    Follow up with [facility]. (01/21/12 - you will see the Anticoagulation Clinic at 2:30pm and have appointment with Norma Fredrickson at [facility])    Contact information:   [address] [place] [address] [phone]           Duration of Discharge Encounter: Greater than 30 minutes including physician and PA time.  Signed, Ronie Spies PA-C 01/04/2012, 4:10 PM  Attending Note:   The patient was seen and examined.  Agree with assessment and plan as noted above.  See my note from earlier today.  Vesta Mixer, Montez Hageman., MD, [facility] 01/04/2012, 4:27 PM

## 2012-01-04 NOTE — Progress Notes (Signed)
PROGRESS NOTE  Subjective:   Nancy West is a 66 yo with hx of smoking - admitted with several days of progressive dyspnea and CP.  She was found to have a  pericardial effusion by bedside echo.  She was started on colchicine and NSAIDs.  She is s/p pericardiocentesis with almost complete resolution of her effusion.  She developed A-fib - which has now resolved.  Objective:    Vital Signs:   Temp:  [97.5 F (36.4 C)-98 F (36.7 C)] 98 F (36.7 C) (07/12 0617) Pulse Rate:  [68-89] 79  (07/12 0617) Resp:  [12-20] 14  (07/12 0617) BP: (95-125)/(40-72) 100/50 mmHg (07/12 0617) SpO2:  [94 %-98 %] 94 % (07/12 0617)  Last BM Date: 01/03/12   24-hour weight change: Weight change:   Weight trends: Filed Weights   12/31/11 0625 01/01/12 0623 01/02/12 0639  Weight: 260 lb 2.3 oz (118 kg) 259 lb 4.8 oz (117.618 kg) 257 lb 4.8 oz (116.711 kg)    Intake/Output:  07/11 0701 - 07/12 0700 In: 360 [P.O.:360] Out: 11 [Drains:10; Stool:1]     Physical Exam: BP 100/50  Pulse 79  Temp 98 F (36.7 C) (Oral)  Resp 14  Ht 5\' 8"  (1.727 m)  Wt 257 lb 4.8 oz (116.711 kg)  BMI 39.12 kg/m2  SpO2 94%  General: Vital signs reviewed and noted. Well-developed, well-nourished, in no acute distress; alert, appropriate and cooperative .  Head: Normocephalic, atraumatic.  Eyes: conjunctivae/corneas clear.  EOM's intact.   Throat: normal  Neck: Supple. Normal carotids. No JVD  Lungs:  Clear to auscultation  Heart: Regular rate,  With normal  S1 S2. I did not hear the friction rub this am.  Abdomen:  Soft, non-tender, non-distended with normoactive bowel sounds. No hepatomegaly. No rebound/guarding. No abdominal masses.  Extremities: Distal pedal pulses are 2+ .  Chronic stasis changes, 1-2 + edema.   Neurologic: A&O X3, CN II - XII are grossly intact. Motor strength is 5/5 in the all 4 extremities.  Psych: Responds to questions appropriately with normal affect.    Labs: BMET:  Basename  01/03/12 0519  NA 134*  K 3.7  CL 93*  CO2 30  GLUCOSE 92  BUN 26*  CREATININE 0.98  CALCIUM 9.2  MG --  PHOS --    Liver function tests:   Tele:  NSR  Medications:    I Scheduled Medications:    . aspirin  81 mg Oral Daily  . atorvastatin  40 mg Oral Daily  . benazepril  20 mg Oral Daily   And  . hydrochlorothiazide  12.5 mg Oral Daily  . cholecalciferol  2,000 Units Oral Daily  . colchicine  0.6 mg Oral BID  . diltiazem  240 mg Oral Daily  . diltiazem  60 mg Oral Q6H  . furosemide  40 mg Oral Daily  . ibuprofen  600 mg Oral Q6H  . insulin aspart  0-15 Units Subcutaneous TID WC  . levothyroxine  50 mcg Oral Daily  . pantoprazole  40 mg Oral Q1200  . rivaroxaban  20 mg Oral QPC supper  . sodium chloride  3 mL Intravenous Q12H  . DISCONTD: amLODipine  5 mg Oral Daily    Assessment/ Plan:   1. Pericardial effusion:  She is feeling better after drainage of the effusion.  Pathology showed inflammatory cells, no malignancy  2. Hyponatremia:  Possibly due to chronic HCTZ therapy.  - possible SIADH. May improve with diuresis.  Her sodium is  much better this am.  3. Pulmonary hypertension - most likely due to COPD.    4. Atrial fibrillation:  She developed A-fib with controlled rate.  She is asymptomatic.  She has hx of diastolic CHF. I would favor anticoagulation with Xarelto . She has now converted to NSR.  Will continue xarelto for 3-6 months and monitor for further episodes of AF.  She is asymptomatic and may have PAF all of the time.  There is a chance the AF was related to the pericarditis and may not recur.   5. Cigarette smoking: I have encouraged her to stop smoking  Disposition: echo today.  Pull pericardial drain if the effusion has resolved by echo.  DC on  Colchicine 0.6 BID  x 1-2 months Motrin200-400 BID  for 3-6 months Xarelto20 mg  for 3-6 months Asa 81 lotensin 20 Lasix 40 QD Kdur 10 cardiazem CD 240 Synthroid 50 mcg Vit D Atorvastatin  40 + other home meds  Lori to see in 2-3 weeks.  I'll see in 2 months.    Length of Stay: 5  Vesta Mixer, Montez Hageman., MD, [facility] 01/04/2012, 7:41 AM Office [phone] Pager [phone]

## 2012-01-06 LAB — BODY FLUID CULTURE: Culture: NO GROWTH

## 2012-01-14 ENCOUNTER — Ambulatory Visit (INDEPENDENT_AMBULATORY_CARE_PROVIDER_SITE_OTHER): Payer: PRIVATE HEALTH INSURANCE | Admitting: Family Medicine

## 2012-01-14 ENCOUNTER — Encounter: Payer: Self-pay | Admitting: Family Medicine

## 2012-01-14 VITALS — BP 112/68 | HR 88 | Ht 68.0 in | Wt 240.0 lb

## 2012-01-14 DIAGNOSIS — I4891 Unspecified atrial fibrillation: Secondary | ICD-10-CM

## 2012-01-14 DIAGNOSIS — F17200 Nicotine dependence, unspecified, uncomplicated: Secondary | ICD-10-CM

## 2012-01-14 DIAGNOSIS — I5032 Chronic diastolic (congestive) heart failure: Secondary | ICD-10-CM

## 2012-01-14 DIAGNOSIS — I309 Acute pericarditis, unspecified: Secondary | ICD-10-CM

## 2012-01-14 DIAGNOSIS — I10 Essential (primary) hypertension: Secondary | ICD-10-CM

## 2012-01-14 DIAGNOSIS — Z79899 Other long term (current) drug therapy: Secondary | ICD-10-CM

## 2012-01-14 DIAGNOSIS — R0989 Other specified symptoms and signs involving the circulatory and respiratory systems: Secondary | ICD-10-CM

## 2012-01-14 NOTE — Progress Notes (Signed)
Chief Complaint  Patient presents with  . Follow-up    hospital follow up.   Went to ER with 11 pound weight gain in 3 days, and midsternal chest pain, and difficulty voiding.  Diagnosed with pericardial effusion, and had atrial fibrillation. She was discharged on 7/12. She denies any recurrent chest pain, leg swelling, voiding difficulties, shortness of breath.  She denies any palpitations, bleeding/bruising, hematuria, blood in stools or other bleeding.  She continues to lose weight--no recurrent weight gain or swelling, or trouble voiding.  No longer having midsternal chest pain, but having slight pain in her L shoulder blade.  She had been reaching and doing laundry, thinks maybe she pulled a muscle; pain comes and goes. Pain x 2 days.  Denies any shortness of breath.  Didn't smoke while she was in the hospital, but since being home, she resumed.  She is smoking much less--has 4 cigarettes daily, only when visiting her mother (also a smoker).  Plans to get e-cigarette to smoke at her mother's house.  Past Medical History  Diagnosis Date  . Obesity   . Hypertension   . Hyperlipidemia   . Diabetes mellitus     Diet controlled  . Hypothyroidism   . Venous stasis of lower extremity     a. chronic LEE  . Tobacco dependence     a. 46 pack yr hx.  . Chest pain     a. 12/2011 Neg Lexi MV, EF 56%.    . Pericardial effusion     a. Large, s/p pericardiocentesis 01/02/12 yielding fluid (neg malignant cells, only inflammatory cells)  . DOE (dyspnea on exertion)     Chronic  . Anemia     Noted 12/2011  . Atrial fib/flutter, transient     In setting of pericardial effusion 12/2011  . Diastolic CHF    Past Surgical History  Procedure Date  . Cholecystectomy 1980  . Appendectomy   . Breast biopsy     a. Benign ~ 20 yr ago.   History   Social History  . Marital Status: Married    Spouse Name: N/A    Number of Children: 0  . Years of Education: N/A   Occupational History  .  Retired (Presenter, broadcasting) [employer]   Social History Main Topics  . Smoking status: Current Everyday Smoker -- 1.0 packs/day for 46 years    Types: Cigarettes  . Smokeless tobacco: Never Used   Comment: 1 ppd since age 41; quit x 7 months with Chantix in past  . Alcohol Use: Yes     maybe 1 drink per week.  . Drug Use: No  . Sexually Active: Not Currently   Other Topics Concern  . Not on file   Social History Narrative   Lives in [place] with husband.  Cares for her elderly mother who lives next door   ROS:  See HPI above.  Denies fevers, URI symptoms, GI, GU complaints, bleeding, bruising. +anxiety, no depression.  Intentional weight loss.  PHYSICAL EXAM: BP 112/68  Pulse 88  Ht 5\' 8"  (1.727 m)  Wt 240 lb (108.863 kg)  BMI 36.49 kg/m2 Pleasant female, mildly anxious, in no distress Neck: no lymphadenopathy, thyromegaly or elevated JVP Heart: regular rate and rhythm without murmur Lungs: crackles at left base, otherwise normal Abdomen: soft, nontender, no mass Extremities: no edema Skin: no rash, no bleeding  hospital records and dischrge summary reviewed in detail.  ASSESSMENT/PLAN: 1. Acute pericardial effusion  DG Chest 2 View  2. Atrial fibrillation    3. Chronic diastolic heart failure    4. Essential hypertension, benign  Basic metabolic panel  5. Tobacco use disorder    6. Encounter for long-term (current) use of other medications  Basic metabolic panel  7. Lung crackles  DG Chest 2 View   Given change in med regimen, including lasix 40 and KCl 10, check b-met today  HTN--well controlled on current regimen (amlodipine changed to diltiazem due to afib, and HCTZ stopped and changed to lasix; benazepril continued separately)  Check CXR due to crackles L base and L sided pain.  Counseled extensively regarding smoking cessation.  F/u with cardiology as scheduled.

## 2012-01-14 NOTE — Patient Instructions (Addendum)
Continue all of your current medications. Please try and quit smoking entirely. Continue your excellent weight loss--but follow all of cardiologists instructions.  Get chest x-ray given the findings on lung exam (to make sure there isn't any fluid overload or other abnormality contributing to your left-sided pain)

## 2012-01-15 LAB — BASIC METABOLIC PANEL
BUN: 26 mg/dL — ABNORMAL HIGH (ref 6–23)
Calcium: 9.4 mg/dL (ref 8.4–10.5)
Creat: 1.06 mg/dL (ref 0.50–1.10)
Glucose, Bld: 96 mg/dL (ref 70–99)

## 2012-01-16 ENCOUNTER — Ambulatory Visit
Admission: RE | Admit: 2012-01-16 | Discharge: 2012-01-16 | Disposition: A | Discharge status: Home / Self Care | Payer: PRIVATE HEALTH INSURANCE | Source: Ambulatory Visit | Attending: Family Medicine | Admitting: Family Medicine

## 2012-01-16 DIAGNOSIS — R0989 Other specified symptoms and signs involving the circulatory and respiratory systems: Secondary | ICD-10-CM

## 2012-01-16 DIAGNOSIS — I309 Acute pericarditis, unspecified: Secondary | ICD-10-CM

## 2012-01-21 ENCOUNTER — Ambulatory Visit (INDEPENDENT_AMBULATORY_CARE_PROVIDER_SITE_OTHER): Payer: PRIVATE HEALTH INSURANCE | Admitting: Nurse Practitioner

## 2012-01-21 ENCOUNTER — Ambulatory Visit (INDEPENDENT_AMBULATORY_CARE_PROVIDER_SITE_OTHER): Payer: PRIVATE HEALTH INSURANCE | Admitting: *Deleted

## 2012-01-21 ENCOUNTER — Encounter: Payer: Self-pay | Admitting: Nurse Practitioner

## 2012-01-21 VITALS — BP 122/64 | HR 76 | Ht 68.0 in | Wt 239.0 lb

## 2012-01-21 DIAGNOSIS — R079 Chest pain, unspecified: Secondary | ICD-10-CM

## 2012-01-21 DIAGNOSIS — F17200 Nicotine dependence, unspecified, uncomplicated: Secondary | ICD-10-CM

## 2012-01-21 DIAGNOSIS — I10 Essential (primary) hypertension: Secondary | ICD-10-CM

## 2012-01-21 DIAGNOSIS — I4891 Unspecified atrial fibrillation: Secondary | ICD-10-CM

## 2012-01-21 DIAGNOSIS — I319 Disease of pericardium, unspecified: Secondary | ICD-10-CM

## 2012-01-21 DIAGNOSIS — I309 Acute pericarditis, unspecified: Secondary | ICD-10-CM

## 2012-01-21 DIAGNOSIS — I313 Pericardial effusion (noninflammatory): Secondary | ICD-10-CM

## 2012-01-21 DIAGNOSIS — R0989 Other specified symptoms and signs involving the circulatory and respiratory systems: Secondary | ICD-10-CM

## 2012-01-21 DIAGNOSIS — I4892 Unspecified atrial flutter: Secondary | ICD-10-CM

## 2012-01-21 MED ORDER — OMEPRAZOLE 20 MG PO CPDR: 20.0000 mg | DELAYED_RELEASE_CAPSULE | Freq: Two times a day (BID) | ORAL | Status: AC

## 2012-01-21 NOTE — Patient Instructions (Addendum)
Pt was started on Xarelto for  Atrial Fib. Flutter  on July 12th 2013.  In [place] 12/30/2011 to 01/04/2012  Reviewed patients medication list.  Pt is not currently on any combined P-gp and strong CYP3A4 inhibitors/inducers (ketoconazole, traconazole, ritonavir, carbamazepine, phenytoin, rifampin, St. John's wort).  Reviewed labs.  SCr 1.06, Weight 108 kg, CrCl- 92 mL/min.  Dose appropriate based on CrCl.   Hgb and HCT Within Normal Limits  A full discussion of the nature of anticoagulants has been carried out.  A benefit/risk analysis has been presented to the patient, so that they understand the justification for choosing anticoagulation with Xarelto at this time.  The need for compliance is stressed.  Pt is aware to take the medication once daily with the largest meal of the day.  Side effects of potential bleeding are discussed, including unusual colored urine or stools, coughing up blood or coffee ground emesis, nose bleeds or serious fall or head trauma.  Discussed signs and symptoms of stroke. The patient should avoid any OTC items containing aspirin or ibuprofen.  Avoid alcohol consumption.   Call if any signs of abnormal bleeding.  Discussed financial obligations and resolved any difficulty in obtaining medication.  Next lab test test in 6 months. Pt informed that she will be called to set up her appt in 6 months for labs  Will be in January 2014.

## 2012-01-21 NOTE — Assessment & Plan Note (Signed)
No rub noted on exam. Have already spoken with Dr. Elease Hashimoto. Will go ahead and update the echo this week. Will continue with her current medicines. Will see her back in about 3 weeks.

## 2012-01-21 NOTE — Assessment & Plan Note (Signed)
She has resumed smoking. Cessation is encouraged.

## 2012-01-21 NOTE — Assessment & Plan Note (Signed)
Blood pressure is currently ok. No change in her medicines today.

## 2012-01-21 NOTE — Progress Notes (Signed)
Nancy West Date of Birth: [birth year] Medical Record #[id number]  History of Present Illness: Nancy West is seen today for a post hospital visit. She is seen for Dr. Elease Hashimoto. She has HTN, HLD, DM, obesity, venous stasis and ongoing tobacco abuse. She presented to the hospital earlier this month with an 11 pound weight gain, DOE, scapular back pain, edema and orthopnea. An echo showed a large pericardial effusion which required drainage. Etiology was felt to be inflammatory. She had pericardiocentesis of 600 cc. Had atrial flutter/fib during that time as well. She was placed on Xarelto in addition to her medicines for the effusion. She had been admitted in June with chest pain and had a negative Myoview at that time. She has an EF of 65 to 70%.   Plan is for her to stay on Xarelto for 3 to 6 months and monitor for recurrent AF. She will be on colchicine for 1 to 2 months, Motrin 200 to 400 mg BID for 3 to 6 months.   She comes in today. She thinks she is doing ok. She admits that she is quite scared and fearful of every little pain she feels. Her breathing has improved. Swelling has resolved. Does note some belching and burping. Tolerating her medicines. Not doing much in the way of activity. Unfortunately, has returned to smoking. She has no awareness of her arrhythmia. No bleeding or bruising. Not dizzy or lightheaded. Notes no palpitations.   Current Outpatient Prescriptions on File Prior to Visit  Medication Sig Dispense Refill  . atorvastatin (LIPITOR) 40 MG tablet Take 40 mg by mouth daily.      . benazepril (LOTENSIN) 20 MG tablet Take 1 tablet (20 mg total) by mouth daily.  30 tablet  6  . Cholecalciferol (VITAMIN D) 1000 UNITS capsule Take 2,000 Units by mouth daily.       . colchicine 0.6 MG tablet Take 1 tablet (0.6 mg total) by mouth 2 (two) times daily.  60 tablet  1  . diltiazem (CARDIZEM CD) 240 MG 24 hr capsule Take 1 capsule (240 mg total) by mouth daily.  30 capsule  6  .  furosemide (LASIX) 40 MG tablet Take 1 tablet (40 mg total) by mouth daily.  30 tablet  6  . levothyroxine (SYNTHROID, LEVOTHROID) 50 MCG tablet Take 1 tablet (50 mcg total) by mouth daily.  90 tablet  1  . nitroGLYCERIN (NITROSTAT) 0.4 MG SL tablet Place 1 tablet (0.4 mg total) under the tongue every 5 (five) minutes as needed for chest pain (up to 3 doses).  25 tablet  3  . potassium chloride (K-DUR,KLOR-CON) 10 MEQ tablet Take 1 tablet (10 mEq total) by mouth daily.  30 tablet  6  . Rivaroxaban (XARELTO) 20 MG TABS Take 1 tablet (20 mg total) by mouth daily after supper.  30 tablet  6  . DISCONTD: omeprazole (PRILOSEC) 20 MG capsule Take 20 mg by mouth daily.      Marland Kitchen ibuprofen (ADVIL,MOTRIN) 200 MG tablet Take 1-2 tablets (200-400 mg total) by mouth 2 (two) times daily.        Allergies  Allergen Reactions  . Augmentin (Amoxicillin-Pot Clavulanate)     Abdominal pain    Past Medical History  Diagnosis Date  . Obesity   . Hypertension   . Hyperlipidemia   . Diabetes mellitus     Diet controlled  . Hypothyroidism   . Venous stasis of lower extremity     a. chronic LEE  .  Tobacco dependence     a. 46 pack yr hx.  . Chest pain     a. 12/2011 Neg Lexi MV, EF 56%.    . Pericardial effusion     a. Large, s/p pericardiocentesis 01/02/12 yielding fluid (neg malignant cells, only inflammatory cells)  . DOE (dyspnea on exertion)     Chronic  . Anemia     Noted 12/2011  . Atrial fib/flutter, transient     In setting of pericardial effusion 12/2011  . Diastolic CHF     Past Surgical History  Procedure Date  . Cholecystectomy 1980  . Appendectomy   . Breast biopsy     a. Benign ~ 20 yr ago.    History  Smoking status  . Current Everyday Smoker -- 0.2 packs/day for 46 years  . Types: Cigarettes  Smokeless tobacco  . Never Used  Comment: 1 ppd since age 4; quit x 7 months with Chantix in past    History  Alcohol Use  . Yes    maybe 1 drink per week.    Family  History  Problem Relation Age of Onset  . Cancer Mother     breast cancer (70), colon cancer  . Diabetes Mother   . Hypertension Mother   . Diabetes Father     died in MVA  . Other Sister     alive and well    Review of Systems: The review of systems is per the HPI.  All other systems were reviewed and are negative.  Physical Exam: BP 122/64  Pulse 76  Ht 5\' 8"  (1.727 m)  Wt 239 lb (108.41 kg)  BMI 36.34 kg/m2 Patient is very pleasant and in no acute distress. She smells of tobacco. She is obese. Skin is warm and dry. Color is normal.  HEENT is unremarkable. Normocephalic/atraumatic. PERRL. Sclera are nonicteric. Neck is supple. No masses. No JVD. Lungs are clear. Cardiac exam shows a regular rate and rhythm. She has frequent ectopics. Abdomen is obese but soft. Extremities are without edema.  She has venous stasis changes.  Gait and ROM are intact. No gross neurologic deficits noted.   LABORATORY DATA: EKG today shows sinus with 1st degree AV block, PVC, & anterior T wave changes  Assessment / Plan:

## 2012-01-21 NOTE — Assessment & Plan Note (Signed)
She is in sinus today. Will need to continue to monitor for recurrent arrhythmia. She is on Xarelto. Rechecking CBC today. She was anemic at discharge. Will repeat her EKG on return.

## 2012-01-21 NOTE — Assessment & Plan Note (Signed)
Her chest pain has improved and she feels like now it is more of an indigestion like issue. She does have belching and burping. I have increased the Prilosec to BID. Her Myoview in June was ok. She does have anterior T wave changes. Will repeat the EKG on return. Patient is agreeable to this plan and will call if any problems develop in the interim.

## 2012-01-21 NOTE — Patient Instructions (Addendum)
We are going to check another ultrasound of your heart this week  Stay on your current medicines except increase your Prilosec to two times a day  Continue to monitor your weight at home  Try to work on your smoking  We are going to check labs today.  Dr. Elease Hashimoto and I will see you in 3 weeks  Call the [facility] office at [phone] if you have any questions, problems or concerns.

## 2012-01-22 ENCOUNTER — Telehealth: Payer: Self-pay | Admitting: *Deleted

## 2012-01-22 LAB — CBC WITH DIFFERENTIAL/PLATELET
Basophils Absolute: 0.1 10*3/uL (ref 0.0–0.1)
Basophils Relative: 0.8 % (ref 0.0–3.0)
Eosinophils Absolute: 0.2 10*3/uL (ref 0.0–0.7)
Eosinophils Relative: 2.6 % (ref 0.0–5.0)
HCT: 36.3 % (ref 36.0–46.0)
Hemoglobin: 12.1 g/dL (ref 12.0–15.0)
Lymphocytes Relative: 24.6 % (ref 12.0–46.0)
Lymphs Abs: 2.2 10*3/uL (ref 0.7–4.0)
MCHC: 33.3 g/dL (ref 30.0–36.0)
MCV: 96.4 fl (ref 78.0–100.0)
Monocytes Absolute: 0.8 10*3/uL (ref 0.1–1.0)
Monocytes Relative: 8.7 % (ref 3.0–12.0)
Neutro Abs: 5.8 10*3/uL (ref 1.4–7.7)
Neutrophils Relative %: 63.3 % (ref 43.0–77.0)
Platelets: 229 10*3/uL (ref 150.0–400.0)
RBC: 3.76 Mil/uL — ABNORMAL LOW (ref 3.87–5.11)
RDW: 12.5 % (ref 11.5–14.6)
WBC: 9.1 10*3/uL (ref 4.5–10.5)

## 2012-01-22 LAB — BASIC METABOLIC PANEL
BUN: 22 mg/dL (ref 6–23)
CO2: 27 mEq/L (ref 19–32)
Calcium: 9.5 mg/dL (ref 8.4–10.5)
Chloride: 100 mEq/L (ref 96–112)
Creatinine, Ser: 0.9 mg/dL (ref 0.4–1.2)
GFR: 71.54 mL/min (ref >60.00)
Glucose, Bld: 93 mg/dL (ref 70–99)
Potassium: 4.6 mEq/L (ref 3.5–5.1)
Sodium: 137 mEq/L (ref 135–145)

## 2012-01-22 NOTE — Telephone Encounter (Signed)
Fu call °Pt returning your call  °

## 2012-01-22 NOTE — Telephone Encounter (Signed)
Message copied by Awilda Bill on Tue Jan 22, 2012  2:52 PM ------      Message from: Rosalio Macadamia      Created: Tue Jan 22, 2012 11:53 AM       Ok to report. Labs are satisfactory.

## 2012-01-22 NOTE — Telephone Encounter (Signed)
LMOM for patient to return call regarding labs.  Vista Mink, CMA

## 2012-01-22 NOTE — Telephone Encounter (Signed)
Patient notified of lab results.  Amanda Becker, CMA 

## 2012-01-24 ENCOUNTER — Ambulatory Visit (HOSPITAL_COMMUNITY): Payer: PRIVATE HEALTH INSURANCE | Attending: Internal Medicine | Admitting: Radiology

## 2012-01-24 DIAGNOSIS — I4892 Unspecified atrial flutter: Secondary | ICD-10-CM | POA: Insufficient documentation

## 2012-01-24 DIAGNOSIS — I517 Cardiomegaly: Secondary | ICD-10-CM | POA: Insufficient documentation

## 2012-01-24 DIAGNOSIS — F17200 Nicotine dependence, unspecified, uncomplicated: Secondary | ICD-10-CM | POA: Insufficient documentation

## 2012-01-24 DIAGNOSIS — I313 Pericardial effusion (noninflammatory): Secondary | ICD-10-CM

## 2012-01-24 DIAGNOSIS — I10 Essential (primary) hypertension: Secondary | ICD-10-CM | POA: Insufficient documentation

## 2012-01-24 DIAGNOSIS — I319 Disease of pericardium, unspecified: Secondary | ICD-10-CM

## 2012-01-24 DIAGNOSIS — E669 Obesity, unspecified: Secondary | ICD-10-CM | POA: Insufficient documentation

## 2012-01-24 DIAGNOSIS — R079 Chest pain, unspecified: Secondary | ICD-10-CM | POA: Insufficient documentation

## 2012-01-24 DIAGNOSIS — I4891 Unspecified atrial fibrillation: Secondary | ICD-10-CM | POA: Insufficient documentation

## 2012-01-24 NOTE — Progress Notes (Signed)
Echocardiogram performed.  

## 2012-01-25 ENCOUNTER — Telehealth: Payer: Self-pay | Admitting: *Deleted

## 2012-01-25 DIAGNOSIS — I313 Pericardial effusion (noninflammatory): Secondary | ICD-10-CM

## 2012-01-25 NOTE — Telephone Encounter (Signed)
Message copied by Antony Odea on Fri Jan 25, 2012  4:09 PM ------      Message from: Vesta Mixer      Created: Fri Jan 25, 2012  3:06 PM       Small pericardial effusion.              Follow up echo in 6 months.  I'll see in the office in 3-4 months            ----- Message -----         From: Rosalio Macadamia, NP         Sent: 01/25/2012   7:44 AM           To: Vesta Mixer, MD            Echo for your review to follow up pericardial effusion.            lori      ----- Message -----         From: [facility] Interface         Sent: 01/24/2012   3:50 PM           To: Rosalio Macadamia, NP

## 2012-01-25 NOTE — Telephone Encounter (Signed)
Results given. 6 mo order placed.

## 2012-02-14 ENCOUNTER — Ambulatory Visit: Payer: PRIVATE HEALTH INSURANCE | Admitting: Nurse Practitioner

## 2012-03-03 ENCOUNTER — Ambulatory Visit (INDEPENDENT_AMBULATORY_CARE_PROVIDER_SITE_OTHER): Payer: PRIVATE HEALTH INSURANCE | Admitting: Nurse Practitioner

## 2012-03-03 ENCOUNTER — Encounter: Payer: Self-pay | Admitting: Nurse Practitioner

## 2012-03-03 VITALS — BP 140/70 | HR 95 | Ht 68.0 in | Wt 235.4 lb

## 2012-03-03 DIAGNOSIS — I319 Disease of pericardium, unspecified: Secondary | ICD-10-CM

## 2012-03-03 DIAGNOSIS — I313 Pericardial effusion (noninflammatory): Secondary | ICD-10-CM

## 2012-03-03 NOTE — Progress Notes (Signed)
Nancy West Date of Birth: [birth year] Medical Record #[id number]  History of Present Illness: Nancy West is seen back today for a follow up visit. She has HTN, HLD, DM, obesity, venous stasis and ongoing tobacco abuse. She was in the hospital back in July with weight gain, DOE , scapular back pain, edema and orthopnea. An echo showed a larger pericardial effusion which required drainage of 600 cc. Etiology was felt to be inflammatory. She had atrial flutter/fib at that time as well and was placed on Xarelto. She had been admitted in June with chest pain and had a negative Myoview at that time. Her EF is 65 to 70%. She has had a repeat echo since this last admission.   The plan was for her to stay on Xarelto for 3 to 6 months and monitor for recurrent AF. She was to be on colchicine for 1 to 2 months and Motrin 200 to 400mg  BID for 3 to 6 months.   She comes in today. She is here alone. She thinks she is doing well. She has only had one episode of some chest discomfort. This occurred after eating some green peppers. She feels like it was just reflux. Still smoking. BP at home is good. She is not sure about her rhythm. She is anxious to start stopping some of her medicines. She is having some diarrhea. She also has a headache that she attributes to the medicines as well. Not dizzy or lightheaded. Overall, she feels pretty well.   Current Outpatient Prescriptions on File Prior to Visit  Medication Sig Dispense Refill  . atorvastatin (LIPITOR) 40 MG tablet Take 40 mg by mouth daily.      . benazepril (LOTENSIN) 20 MG tablet Take 1 tablet (20 mg total) by mouth daily.  30 tablet  6  . Cholecalciferol (VITAMIN D) 1000 UNITS capsule Take 2,000 Units by mouth daily.       Marland Kitchen diltiazem (CARDIZEM CD) 240 MG 24 hr capsule Take 1 capsule (240 mg total) by mouth daily.  30 capsule  6  . furosemide (LASIX) 40 MG tablet Take 1 tablet (40 mg total) by mouth daily.  30 tablet  6  . ibuprofen (ADVIL,MOTRIN)  200 MG tablet Take 1-2 tablets (200-400 mg total) by mouth 2 (two) times daily.      Marland Kitchen levothyroxine (SYNTHROID, LEVOTHROID) 50 MCG tablet Take 1 tablet (50 mcg total) by mouth daily.  90 tablet  1  . nitroGLYCERIN (NITROSTAT) 0.4 MG SL tablet Place 1 tablet (0.4 mg total) under the tongue every 5 (five) minutes as needed for chest pain (up to 3 doses).  25 tablet  3  . omeprazole (PRILOSEC) 20 MG capsule Take 1 capsule (20 mg total) by mouth 2 (two) times daily.      . potassium chloride (K-DUR,KLOR-CON) 10 MEQ tablet Take 1 tablet (10 mEq total) by mouth daily.  30 tablet  6  . Rivaroxaban (XARELTO) 20 MG TABS Take 1 tablet (20 mg total) by mouth daily after supper.  30 tablet  6    Allergies  Allergen Reactions  . Augmentin (Amoxicillin-Pot Clavulanate)     Abdominal pain    Past Medical History  Diagnosis Date  . Obesity   . Hypertension   . Hyperlipidemia   . Diabetes mellitus     Diet controlled  . Hypothyroidism   . Venous stasis of lower extremity     a. chronic LEE  . Tobacco dependence     a.  46 pack yr hx.  . Chest pain     a. 12/2011 Neg Lexi MV, EF 56%.    . Pericardial effusion     a. Large, s/p pericardiocentesis 01/02/12 yielding fluid (neg malignant cells, only inflammatory cells)  . DOE (dyspnea on exertion)     Chronic  . Anemia     Noted 12/2011  . Atrial fib/flutter, transient     In setting of pericardial effusion 12/2011  . Diastolic CHF     Past Surgical History  Procedure Date  . Cholecystectomy 1980  . Appendectomy   . Breast biopsy     a. Benign ~ 64 yr ago.    History  Smoking status  . Current Everyday Smoker -- 0.2 packs/day for 46 years  . Types: Cigarettes  Smokeless tobacco  . Never Used  Comment: 1 ppd since age 66; quit x 7 months with Chantix in past    History  Alcohol Use  . Yes    maybe 1 drink per week.    Family History  Problem Relation Age of Onset  . Cancer Mother     breast cancer (70), colon cancer  .  Diabetes Mother   . Hypertension Mother   . Diabetes Father     died in MVA  . Other Sister     alive and well    Review of Systems: The review of systems is per the HPI.  All other systems were reviewed and are negative.  Physical Exam: BP 140/70  Pulse 95  Ht 5\' 8"  (1.727 m)  Wt 235 lb 6.4 oz (106.777 kg)  BMI 35.79 kg/m2 Patient is very pleasant and in no acute distress. She is obese. Weight is down 4 pounds. Skin is warm and dry. Color is normal.  HEENT is unremarkable. Normocephalic/atraumatic. PERRL. Sclera are nonicteric. Neck is supple. No masses. No JVD. Lungs are clear. Cardiac exam shows a regular rate and rhythm. No rub. Occasional ectopic.  Abdomen is obese but soft. Extremities are without edema. Gait and ROM are intact. No gross neurologic deficits noted.   LABORATORY DATA:  EKG today shows sinus with PVC's. She has nonspecific T wave changes.   Lab Results  Component Value Date   WBC 9.1 01/21/2012   HGB 12.1 01/21/2012   HCT 36.3 01/21/2012   PLT 229.0 01/21/2012   GLUCOSE 93 01/21/2012   CHOL 112 12/19/2011   TRIG 42 12/19/2011   HDL 55 12/19/2011   LDLCALC 49 12/19/2011   ALT 12 12/31/2011   AST 16 12/31/2011   NA 137 01/21/2012   K 4.6 01/21/2012   CL 100 01/21/2012   CREATININE 0.9 01/21/2012   BUN 22 01/21/2012   CO2 27 01/21/2012   TSH 2.410 12/30/2011   INR 1.10 12/30/2011   HGBA1C 5.6 12/19/2011   MICROALBUR 1.42 12/13/2011     Assessment / Plan:  1. Pericardial effusion - she continues to do well. She will need a repeat echo in a few more months. I have stopped the Colchicine. Will see her back in a month and stop the Ibuprofen.  2. PAF/flutter - she remains in sinus. She really does not know how her rhythm has been doing. We may to place an event monitor before we stop the Xarelto.   3. Tobacco abuse - she is counseled.  I will see her back in 4 weeks. Colchicine is stopped today. Hope to continue to discontinue medicines over the next 1 to 2 months. Patient  is agreeable to this plan and will call if any problems develop in the interim.

## 2012-03-03 NOTE — Patient Instructions (Addendum)
Stop the colchicine  I will see you in a month. Hope to stop your ibuprofen at that visit.   Try to work on your smoking  We will check an EKG today  Call the [facility] office at [phone] if you have any questions, problems or concerns.

## 2012-03-10 ENCOUNTER — Other Ambulatory Visit: Payer: Self-pay | Admitting: Family Medicine

## 2012-03-19 ENCOUNTER — Ambulatory Visit (INDEPENDENT_AMBULATORY_CARE_PROVIDER_SITE_OTHER): Payer: PRIVATE HEALTH INSURANCE | Admitting: Family Medicine

## 2012-03-19 ENCOUNTER — Encounter: Payer: Self-pay | Admitting: Family Medicine

## 2012-03-19 VITALS — BP 110/64 | HR 72 | Ht 68.0 in | Wt 234.0 lb

## 2012-03-19 DIAGNOSIS — E7800 Pure hypercholesterolemia, unspecified: Secondary | ICD-10-CM

## 2012-03-19 DIAGNOSIS — I10 Essential (primary) hypertension: Secondary | ICD-10-CM

## 2012-03-19 DIAGNOSIS — E669 Obesity, unspecified: Secondary | ICD-10-CM

## 2012-03-19 DIAGNOSIS — E119 Type 2 diabetes mellitus without complications: Secondary | ICD-10-CM

## 2012-03-19 DIAGNOSIS — Z23 Encounter for immunization: Secondary | ICD-10-CM

## 2012-03-19 DIAGNOSIS — F17200 Nicotine dependence, unspecified, uncomplicated: Secondary | ICD-10-CM

## 2012-03-19 DIAGNOSIS — E039 Hypothyroidism, unspecified: Secondary | ICD-10-CM

## 2012-03-19 NOTE — Progress Notes (Signed)
Chief Complaint  Patient presents with  . Hypertension    and weight follow up.   HPI:  Pt requested this appt to help keep her on track with her weight loss.  Not due for labs. Started water aerobics twice weekly, and 30 minutes every day on her exercise machine.  HTN--denies dizziness.  BP's are running 110-130/60's.  Denies chest pain, palpitations, edema.  Still smoking, back up to a pack a day.  Saw cardiologist earlier this month--on xarelto and motrin, and colchicine was stopped.  Past Medical History  Diagnosis Date  . Obesity   . Hypertension   . Hyperlipidemia   . Diabetes mellitus     Diet controlled  . Hypothyroidism   . Venous stasis of lower extremity     a. chronic LEE  . Tobacco dependence     a. 46 pack yr hx.  . Chest pain     a. 12/2011 Neg Lexi MV, EF 56%.    . Pericardial effusion     a. Large, s/p pericardiocentesis 01/02/12 yielding fluid (neg malignant cells, only inflammatory cells)  . DOE (dyspnea on exertion)     Chronic  . Anemia     Noted 12/2011  . Atrial fib/flutter, transient     In setting of pericardial effusion 12/2011  . Diastolic CHF    Past Surgical History  Procedure Date  . Cholecystectomy 1980  . Appendectomy   . Breast biopsy     a. Benign ~ 20 yr ago.   History   Social History  . Marital Status: Married    Spouse Name: N/A    Number of Children: 0  . Years of Education: N/A   Occupational History  . Retired (Presenter, broadcasting) [employer]   Social History Main Topics  . Smoking status: Current Every Day Smoker -- 0.2 packs/day for 46 years    Types: Cigarettes  . Smokeless tobacco: Never Used   Comment: 1 ppd since age 43; quit x 7 months with Chantix in past  . Alcohol Use: Yes     maybe 1 drink per week.  . Drug Use: No  . Sexually Active: Not Currently   Other Topics Concern  . Not on file   Social History Narrative   Lives in [place] with husband.  Cares for her elderly mother who lives next door      Current Outpatient Prescriptions on File Prior to Visit  Medication Sig Dispense Refill  . atorvastatin (LIPITOR) 40 MG tablet Take 40 mg by mouth daily.      . benazepril (LOTENSIN) 20 MG tablet Take 1 tablet (20 mg total) by mouth daily.  30 tablet  6  . Cholecalciferol (VITAMIN D) 1000 UNITS capsule Take 2,000 Units by mouth daily.       Marland Kitchen diltiazem (CARDIZEM CD) 240 MG 24 hr capsule Take 1 capsule (240 mg total) by mouth daily.  30 capsule  6  . furosemide (LASIX) 40 MG tablet Take 1 tablet (40 mg total) by mouth daily.  30 tablet  6  . ibuprofen (ADVIL,MOTRIN) 200 MG tablet Take 1-2 tablets (200-400 mg total) by mouth 2 (two) times daily.      Marland Kitchen levothyroxine (SYNTHROID, LEVOTHROID) 50 MCG tablet Take 1 tablet (50 mcg total) by mouth daily.  90 tablet  1  . omeprazole (PRILOSEC) 20 MG capsule Take 1 capsule (20 mg total) by mouth 2 (two) times daily.      . potassium chloride (K-DUR,KLOR-CON) 10 MEQ  tablet Take 1 tablet (10 mEq total) by mouth daily.  30 tablet  6  . Rivaroxaban (XARELTO) 20 MG TABS Take 1 tablet (20 mg total) by mouth daily after supper.  30 tablet  6  . nitroGLYCERIN (NITROSTAT) 0.4 MG SL tablet Place 1 tablet (0.4 mg total) under the tongue every 5 (five) minutes as needed for chest pain (up to 3 doses).  25 tablet  3   Allergies  Allergen Reactions  . Augmentin (Amoxicillin-Pot Clavulanate)     Abdominal pain   ROS:  Denies fevers; mild cough. No shortness of breath, sinus pain. No nausea, vomiting, diarrhea, abdominal pain, chest pain, palpitations, skin rash, bleeding/bruising, depression, or other concerns.  PHYSICAL EXAM: BP 110/64  Pulse 72  Ht 5\' 8"  (1.727 m)  Wt 234 lb (106.142 kg)  BMI 35.58 kg/m2 Well developed female in no distress Neck: no lymphadenopathy or mass Heart: regular rate and rhythm without murmur Lungs: clear bilaterally Abdomen: soft, nontender Extremities: hyperpigmented, venous stasis changes.  Only trace edema. 2+  pulses  ASSESSMENT/PLAN:  1. Need for prophylactic vaccination and inoculation against influenza  Flu vaccine greater than or equal to 3yo preservative free IM  2. Essential hypertension, benign    3. Obesity (BMI 30-39.9)    4. Tobacco use disorder     Smoking--thinks about quitting daily, but isn't quite ready. Encouraged to set a quit date, and work on cutting back.  Reviewed available free options.  HTN--controlled Obesity--congratulated on continued weight loss  F/u January-- A1c, TSH, lipids, lfts prior to visit

## 2012-03-19 NOTE — Patient Instructions (Signed)
Please quit smoking! Continue daily exercise and weight loss--keep up the good work!

## 2012-04-01 ENCOUNTER — Ambulatory Visit (INDEPENDENT_AMBULATORY_CARE_PROVIDER_SITE_OTHER): Payer: PRIVATE HEALTH INSURANCE | Admitting: Nurse Practitioner

## 2012-04-01 ENCOUNTER — Encounter: Payer: Self-pay | Admitting: Nurse Practitioner

## 2012-04-01 VITALS — BP 134/70 | HR 83 | Ht 68.0 in | Wt 233.8 lb

## 2012-04-01 DIAGNOSIS — I480 Paroxysmal atrial fibrillation: Secondary | ICD-10-CM

## 2012-04-01 DIAGNOSIS — I4891 Unspecified atrial fibrillation: Secondary | ICD-10-CM

## 2012-04-01 NOTE — Progress Notes (Signed)
Nancy West: 08/07/1945 Medical Record #[id number]  History of Present Illness: Nancy West is seen back today for a 1 month check. She is seen for Dr. Elease Hashimoto. She has HTN, HLD, DM, obesity, venous stasis and ongoing tobacco abuse. She had a large pericardial effusion back in July. Etiology was felt to be inflammatory. She had atrial flutter/fib at that time and was put on Xarelto. She had no awareness of the PAF. She has had a negative Myoview back in June when she was admitted with chest pain. She was taken off of her Colchicine at her last visit. Our plan was to stop the NSAID today. Needs a monitor for recurrent AF before we decide about long term anticoagulation. She had a follow up echo since this last admission to reevaluate the effusion.   She comes in today. She is here alone. Says she is doing well. Has stopped her Colchicine. Still smoking. Her headaches have stopped. She does not know how her rhythm has been. Not dizzy or lightheaded.   Current Outpatient Prescriptions on File Prior to Visit  Medication Sig Dispense Refill  . atorvastatin (LIPITOR) 40 MG tablet Take 40 mg by mouth daily.      . benazepril (LOTENSIN) 20 MG tablet Take 1 tablet (20 mg total) by mouth daily.  30 tablet  6  . Cholecalciferol (VITAMIN D) 1000 UNITS capsule Take 2,000 Units by mouth daily.       Marland Kitchen diltiazem (CARDIZEM CD) 240 MG 24 hr capsule Take 1 capsule (240 mg total) by mouth daily.  30 capsule  6  . furosemide (LASIX) 40 MG tablet Take 1 tablet (40 mg total) by mouth daily.  30 tablet  6  . levothyroxine (SYNTHROID, LEVOTHROID) 50 MCG tablet Take 1 tablet (50 mcg total) by mouth daily.  90 tablet  1  . nitroGLYCERIN (NITROSTAT) 0.4 MG SL tablet Place 1 tablet (0.4 mg total) under the tongue every 5 (five) minutes as needed for chest pain (up to 3 doses).  25 tablet  3  . omeprazole (PRILOSEC) 20 MG capsule Take 1 capsule (20 mg total) by mouth 2 (two) times daily.      . potassium  chloride (K-DUR,KLOR-CON) 10 MEQ tablet Take 1 tablet (10 mEq total) by mouth daily.  30 tablet  6  . Rivaroxaban (XARELTO) 20 MG TABS Take 1 tablet (20 mg total) by mouth daily after supper.  30 tablet  6    Allergies  Allergen Reactions  . Augmentin (Amoxicillin-Pot Clavulanate)     Abdominal pain    Past Medical History  Diagnosis Date  . Obesity   . Hypertension   . Hyperlipidemia   . Diabetes mellitus     Diet controlled  . Hypothyroidism   . Venous stasis of lower extremity     a. chronic LEE  . Tobacco dependence     a. 46 pack yr hx.  . Chest pain     a. 12/2011 Neg Lexi MV, EF 56%.    . Pericardial effusion     a. Large, s/p pericardiocentesis 01/02/12 yielding fluid (neg malignant cells, only inflammatory cells)  . DOE (dyspnea on exertion)     Chronic  . Anemia     Noted 12/2011  . Atrial fib/flutter, transient     In setting of pericardial effusion 12/2011  . Diastolic CHF     Past Surgical History  Procedure Date  . Cholecystectomy 1980  . Appendectomy   .  Breast biopsy     a. Benign ~ 20 yr ago.    History  Smoking status  . Current Every Day Smoker -- 0.2 packs/day for 46 years  . Types: Cigarettes  Smokeless tobacco  . Never Used  Comment: 1 ppd since age 39; quit x 7 months with Chantix in past    History  Alcohol Use  . Yes    maybe 1 drink per week.    Family History  Problem Relation Age of Onset  . Cancer Mother     breast cancer (70), colon cancer  . Diabetes Mother   . Hypertension Mother   . Diabetes Father     died in MVA  . Other Sister     alive and well    Review of Systems: The review of systems is per the HPI.  All other systems were reviewed and are negative.  Physical Exam: BP 134/70  Pulse 83  Ht 5\' 8"  (1.727 m)  Wt 233 lb 12.8 oz (106.051 kg)  BMI 35.55 kg/m2 Patient is very pleasant and in no acute distress. She is obese. Skin is warm and dry. Color is normal.  HEENT is unremarkable.  Normocephalic/atraumatic. PERRL. Sclera are nonicteric. Neck is supple. No masses. No JVD. Lungs are clear. Cardiac exam shows a regular rate and rhythm. She has frequent ectopics. No rub noted. Abdomen is soft. Extremities are without edema. Gait and ROM are intact. No gross neurologic deficits noted.  LABORATORY DATA: N/A   Assessment / Plan: 1. Pericardial effusion - doing well. I have stopped her NSAID today. She will need a follow up echo in a couple of months.  2. PAF - no awareness. EKG today shows sinus with PVC's. We will go ahead and place an event monitor today for the next 30 days.  3. Tobacco abuse - cessation is still encouraged. She is not ready to stop.  Patient is agreeable to this plan and will call if any problems develop in the interim.

## 2012-04-01 NOTE — Patient Instructions (Addendum)
Stop the ibuprofen.  We are going to check an EKG today  We are going to put on an event monitor on for the next 30 days.  See Dr. Elease Hashimoto in 5 weeks.   Call the [facility] office at [phone] if you have any questions, problems or concerns.

## 2012-04-14 ENCOUNTER — Telehealth: Payer: Self-pay | Admitting: *Deleted

## 2012-04-14 MED ORDER — METOPROLOL TARTRATE 25 MG PO TABS: 25.0000 mg | ORAL_TABLET | Freq: Two times a day (BID) | ORAL | Status: DC

## 2012-04-14 NOTE — Telephone Encounter (Signed)
Ecardio called with multifocal PVC's 25 per minute seen on event monitor. Dr Elease Hashimoto wanted her to start metoprolol 25 mg BID. Pt informed, asked her to get bp/p daily and call with questions. Informed her of medication side effects, pt agreed to plan.

## 2012-05-01 ENCOUNTER — Telehealth: Payer: Self-pay | Admitting: *Deleted

## 2012-05-01 NOTE — Telephone Encounter (Signed)
Called to give ecardio results of NSR w/ freq pvc's, asked her to call back for result, number provided.

## 2012-05-02 NOTE — Telephone Encounter (Signed)
Results given.

## 2012-05-06 ENCOUNTER — Ambulatory Visit (INDEPENDENT_AMBULATORY_CARE_PROVIDER_SITE_OTHER): Payer: PRIVATE HEALTH INSURANCE | Admitting: Cardiovascular Disease

## 2012-05-06 ENCOUNTER — Encounter: Payer: Self-pay | Admitting: Cardiovascular Disease

## 2012-05-06 VITALS — BP 138/70 | HR 72 | Ht 68.0 in | Wt 239.2 lb

## 2012-05-06 DIAGNOSIS — I309 Acute pericarditis, unspecified: Secondary | ICD-10-CM

## 2012-05-06 DIAGNOSIS — I4891 Unspecified atrial fibrillation: Secondary | ICD-10-CM

## 2012-05-06 DIAGNOSIS — F17200 Nicotine dependence, unspecified, uncomplicated: Secondary | ICD-10-CM

## 2012-05-06 NOTE — Patient Instructions (Addendum)
Your physician has recommended you make the following change in your medication: stop xarelto, no further episodes of afib  Your physician wants you to follow-up in: 6 months  You will receive a reminder letter in the mail two months in advance. If you don't receive a letter, please call our office to schedule the follow-up appointment.

## 2012-05-06 NOTE — Progress Notes (Signed)
Nancy West Date of Birth  20-Oct-1945 [facility] [address]     [address] [place], [place]  [address] [phone]  Fax  [phone]  Problem List: 1. PVCs 2. Pericardial effusion  3. Atrial Fibrillation 4. Hypothyroidism 5. Hyperlipidemia  History of Present Illness:  Nancy West is a 66 year old female who is a previous patient of Dr. Reyes West. She has a history of morbid obesity, diastolic dysfunction, hypertension, and cigarette smoking. She presents today for followup of her palpitations. She was hospitalized earlier this summer with pericardial effusion and had a pericardial window.  She has a history of hypertension. Blood pressure has been fairly good and her home meds.   It is a bit elevated today. She has white coat hypertension. She denies any chest pain or shortness breath.  She's doing water aerobics on occasion. 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 Unfortunately, she continues to smoke. We placed an event monitor on her during her last visit. She was found to have numerous premature ventricular contractions. There was no evidence of atrial fibrillation.  Current Outpatient Prescriptions on File Prior to Visit  Medication Sig Dispense Refill  . atorvastatin (LIPITOR) 40 MG tablet Take 40 mg by mouth daily.      . benazepril (LOTENSIN) 20 MG tablet Take 1 tablet (20 mg total) by mouth daily.  30 tablet  6  . Cholecalciferol (VITAMIN D) 1000 UNITS capsule Take 2,000 Units by mouth daily.       Marland Kitchen diltiazem (CARDIZEM CD) 240 MG 24 hr capsule Take 1 capsule (240 mg total) by mouth daily.  30 capsule  6  . furosemide (LASIX) 40 MG tablet Take 1 tablet (40 mg total) by mouth daily.  30 tablet  6  . levothyroxine (SYNTHROID, LEVOTHROID) 50 MCG tablet Take 1 tablet (50 mcg total) by mouth daily.  90 tablet  1  . metoprolol tartrate (LOPRESSOR) 25 MG tablet Take 1 tablet (25 mg total) by mouth 2 (two) times daily.  60 tablet  6    . nitroGLYCERIN (NITROSTAT) 0.4 MG SL tablet Place 1 tablet (0.4 mg total) under the tongue every 5 (five) minutes as needed for chest pain (up to 3 doses).  25 tablet  3  . omeprazole (PRILOSEC) 20 MG capsule Take 1 capsule (20 mg total) by mouth 2 (two) times daily.      . potassium chloride (K-DUR,KLOR-CON) 10 MEQ tablet Take 1 tablet (10 mEq total) by mouth daily.  30 tablet  6  . Rivaroxaban (XARELTO) 20 MG TABS Take 1 tablet (20 mg total) by mouth daily after supper.  30 tablet  6    Allergies  Allergen Reactions  . Augmentin (Amoxicillin-Pot Clavulanate)     Abdominal pain    Past Medical History  Diagnosis Date  . Obesity   . Hypertension   . Hyperlipidemia   . Diabetes mellitus     Diet controlled  . Hypothyroidism   . Venous stasis of lower extremity     a. chronic LEE  . Tobacco dependence     a. 46 pack yr hx.  . Chest pain     a. 12/2011 Neg Lexi MV, EF 56%.    . Pericardial effusion     a. Large, s/p pericardiocentesis 01/02/12 yielding fluid (neg malignant cells, only inflammatory cells)  . DOE (dyspnea on exertion)     Chronic  . Anemia     Noted 12/2011  . Atrial fib/flutter,  transient     In setting of pericardial effusion 12/2011  . Diastolic CHF     Past Surgical History  Procedure Date  . Cholecystectomy 1980  . Appendectomy   . Breast biopsy     a. Benign ~ 20 yr ago.    History  Smoking status  . Current Every Day Smoker -- 0.2 packs/day for 46 years  . Types: Cigarettes  Smokeless tobacco  . Never Used    Comment: 1 ppd since age 40; quit x 7 months with Chantix in past    History  Alcohol Use  . Yes    Comment: maybe 1 drink per week.    Family History  Problem Relation Age of Onset  . Cancer Mother     breast cancer (70), colon cancer  . Diabetes Mother   . Hypertension Mother   . Diabetes Father     died in MVA  . Other Sister     alive and well    Reviw of Systems:  Reviewed in the HPI.  All other systems are  negative.  Physical Exam: BP 138/70  Pulse 72  Ht 5\' 8"  (1.727 m)  Wt 239 lb 3.2 oz (108.5 kg)  BMI 36.37 kg/m2  SpO2 96% The patient is alert and oriented x 3.  The mood and affect are normal.   Skin: warm and dry.  Color is normal.    HEENT:   the sclera are nonicteric.  The mucous membranes are moist.  The carotids are 2+ without bruits.  There is no thyromegaly.  There is no JVD.    Lungs: clear.  The chest wall is non tender.    Heart: regular rate with a normal S1 and S2.  There are no murmurs, gallops, or rubs. The PMI is not displaced.     Abdomen: She is morbidly obese. good bowel sounds.  There is no guarding or rebound.  There is no hepatosplenomegaly or tenderness.  There are no masses.   Extremities:  There is trace edema.  She has chronic stasis changes.     Neuro:  Cranial nerves II - XII are intact.  Motor and sensory functions are intact.    The gait is normal.  ECG:  Assessment / Plan:

## 2012-05-06 NOTE — Assessment & Plan Note (Signed)
She's not had any further episodes of atrial fibrillation. Her 30 day event monitor did not reveal any further episodes of atrial fibrillation. I suspect that the A. fib was related to her pericardial effusion and pericarditis.  We will discontinue the Xarelto.

## 2012-05-06 NOTE — Assessment & Plan Note (Signed)
I have encouraged her to stop smoking.  

## 2012-05-06 NOTE — Assessment & Plan Note (Signed)
Nancy West is doing well. She is status post pericardial window. There is no evidence of recurrent pericardial effusion.    There were no evidence of cancer cells. This was likely an inflammatory process. I have encouraged her to stop smoking as this would be one possible source of inflammation.

## 2012-06-20 ENCOUNTER — Encounter: Payer: Self-pay | Admitting: *Deleted

## 2012-06-26 ENCOUNTER — Other Ambulatory Visit: Payer: PRIVATE HEALTH INSURANCE

## 2012-06-30 ENCOUNTER — Encounter: Payer: PRIVATE HEALTH INSURANCE | Admitting: Family Medicine

## 2012-07-31 ENCOUNTER — Other Ambulatory Visit: Payer: Self-pay | Admitting: Physician Assistant

## 2012-08-08 ENCOUNTER — Other Ambulatory Visit: Payer: Self-pay | Admitting: Family Medicine

## 2012-08-08 DIAGNOSIS — E039 Hypothyroidism, unspecified: Secondary | ICD-10-CM

## 2012-08-08 MED ORDER — LEVOTHYROXINE SODIUM 50 MCG PO TABS: 50.0000 ug | ORAL_TABLET | Freq: Every day | ORAL | Status: DC

## 2012-08-08 NOTE — Telephone Encounter (Signed)
Fax refill request  Atorvastatin 40 mg 90 day supply

## 2012-08-08 NOTE — Telephone Encounter (Signed)
Fax refill request from [facility]    Levothyroxine 50 MCG 90 day supply

## 2012-08-08 NOTE — Telephone Encounter (Signed)
She was due for labs (and visit) in January.  Looks like she canceled her appt.  Please call to r/s her appointment (for labs and med check, or CPE--whatever January visit was supposed to be).  Refill until visit only (both her thyroid med and atorvastatin)

## 2012-08-12 ENCOUNTER — Telehealth: Payer: Self-pay | Admitting: Internal Medicine

## 2012-08-12 DIAGNOSIS — E7800 Pure hypercholesterolemia, unspecified: Secondary | ICD-10-CM

## 2012-08-12 DIAGNOSIS — E039 Hypothyroidism, unspecified: Secondary | ICD-10-CM

## 2012-08-12 MED ORDER — ATORVASTATIN CALCIUM 40 MG PO TABS: 40.0000 mg | ORAL_TABLET | Freq: Every day | ORAL | Status: DC

## 2012-08-12 MED ORDER — LEVOTHYROXINE SODIUM 50 MCG PO TABS: 50.0000 ug | ORAL_TABLET | Freq: Every day | ORAL | Status: DC

## 2012-08-12 NOTE — Telephone Encounter (Signed)
Gave to v and she is taking care of it because this is a knapp pt

## 2012-09-01 ENCOUNTER — Other Ambulatory Visit: Payer: BC Managed Care – PPO

## 2012-09-01 DIAGNOSIS — E7800 Pure hypercholesterolemia, unspecified: Secondary | ICD-10-CM

## 2012-09-01 DIAGNOSIS — E039 Hypothyroidism, unspecified: Secondary | ICD-10-CM

## 2012-09-01 DIAGNOSIS — E119 Type 2 diabetes mellitus without complications: Secondary | ICD-10-CM

## 2012-09-02 LAB — LIPID PANEL: LDL Cholesterol: 93 mg/dL (ref 0–99)

## 2012-09-02 LAB — HEPATIC FUNCTION PANEL
ALT: 14 U/L (ref 0–35)
AST: 17 U/L (ref 0–37)
Albumin: 4.6 g/dL (ref 3.5–5.2)
Alkaline Phosphatase: 89 U/L (ref 39–117)
Indirect Bilirubin: 0.6 mg/dL (ref 0.0–0.9)
Total Protein: 7.6 g/dL (ref 6.0–8.3)

## 2012-09-02 LAB — HEMOGLOBIN A1C
Hgb A1c MFr Bld: 5.8 % — ABNORMAL HIGH (ref <5.7)
Mean Plasma Glucose: 120 mg/dL — ABNORMAL HIGH (ref <117)

## 2012-09-02 LAB — TSH: TSH: 1.618 u[IU]/mL (ref 0.350–4.500)

## 2012-09-04 ENCOUNTER — Ambulatory Visit: Payer: BC Managed Care – PPO | Admitting: Family Medicine

## 2012-09-04 ENCOUNTER — Encounter: Payer: Self-pay | Admitting: Family Medicine

## 2012-09-04 VITALS — BP 120/70 | HR 68 | Ht 68.0 in | Wt 249.0 lb

## 2012-09-04 DIAGNOSIS — E669 Obesity, unspecified: Secondary | ICD-10-CM

## 2012-09-04 DIAGNOSIS — E119 Type 2 diabetes mellitus without complications: Secondary | ICD-10-CM

## 2012-09-04 DIAGNOSIS — R5383 Other fatigue: Secondary | ICD-10-CM

## 2012-09-04 DIAGNOSIS — I10 Essential (primary) hypertension: Secondary | ICD-10-CM

## 2012-09-04 DIAGNOSIS — F17200 Nicotine dependence, unspecified, uncomplicated: Secondary | ICD-10-CM

## 2012-09-04 DIAGNOSIS — E039 Hypothyroidism, unspecified: Secondary | ICD-10-CM

## 2012-09-04 DIAGNOSIS — E7800 Pure hypercholesterolemia, unspecified: Secondary | ICD-10-CM

## 2012-09-04 NOTE — Patient Instructions (Addendum)
congrats on quitting smoking.  Don't forget about 1800-QUITNOW or NCQUITline.com for free counseling/advice. Continue to watch your diet closely, so as not to regain more weight. Continue daily exercise. Your cholesterol and sugars aren't quite as perfect as they were in June, but overall are fine.  Continue low carb/sugar, lowfat, low cholesterol, low sodium diet  Have pharmacy contact for refills when needed (should be in about 2 months) Return in 6 months for complete physical--at that point will be 67--Welcome to Medicare. Will need pneumovax Check date and type of last tetanus--if les than 10 years, but didn't have the Pertussis (if just Td, and not TdaP) then will need TdaP.  Medicare doesn't pay for tetanus, so you might want to get that at a nurse visit prior to turning 67. Check for shingles coverage--you might want to get prior to turning 67--might cost less out of pocket

## 2012-09-04 NOTE — Progress Notes (Signed)
Chief Complaint  Patient presents with  . Hypertension    med check, labs already done.   Patient presents for med check, follow-up labs, with no specific complaints or concerns.  She gained 10 pounds since November.  Went off Nutrasystem before Thanksgiving.  Trying to eat healthy, reading labels, counting calories.  She is exercising an hour a day on the elliptical bike.  Walking her new puppy.  She quit smoking in early December (very rare cigarette since then).  Hypertension follow-up:  Blood pressures are not checked elsewhere.  Denies dizziness, headaches, chest pain.  Denies side effects of medications. Hyperlipidemia follow-up:  Patient is reportedly following a low-fat, low cholesterol diet.  Compliant with medications and denies medication side effects Hypothyroidism:  Compliant with medications, taking an hour before all other medications.  Denies symptoms (just weight gain since she went off nutrasystem).  No skin/hair/bowel changes, energy is good.  Diabetes/impaired fasting glucose (never has been treated with meds).  Doesn't check blood sugars.  No polydipsia or polyuria. Checks feet regularly--denies numbness/tingling/sores.  Denies swelling of feet.  Denies pain in calves or feet.  Past Medical History  Diagnosis Date  . Obesity   . Hypertension   . Hyperlipidemia   . Diabetes mellitus     Diet controlled  . Hypothyroidism   . Venous stasis of lower extremity     a. chronic LEE  . Tobacco dependence     a. 46 pack yr hx.  . Chest pain     a. 12/2011 Neg Lexi MV, EF 56%.    . Pericardial effusion     a. Large, s/p pericardiocentesis 01/02/12 yielding fluid (neg malignant cells, only inflammatory cells)  . DOE (dyspnea on exertion)     Chronic  . Anemia     Noted 12/2011  . Atrial fib/flutter, transient     In setting of pericardial effusion 12/2011  . Diastolic CHF    Past Surgical History  Procedure Laterality Date  . Cholecystectomy  1980  .  Appendectomy    . Breast biopsy      a. Benign ~ 20 yr ago.   History   Social History  . Marital Status: Married    Spouse Name: N/A    Number of Children: 0  . Years of Education: N/A   Occupational History  . Retired (Presenter, broadcasting) [employer]   Social History Main Topics  . Smoking status: Current Every Day Smoker -- 0.20 packs/day for 46 years    Types: Cigarettes  . Smokeless tobacco: Never Used     Comment: 1 ppd since age 11; quit x 7 months with Chantix in past  . Alcohol Use: Yes     Comment: maybe 1 drink per week.  . Drug Use: No  . Sexually Active: Not Currently   Other Topics Concern  . Not on file   Social History Narrative   Lives in [place] with husband.  Cares for her elderly mother who lives next door   Current Outpatient Prescriptions on File Prior to Visit  Medication Sig Dispense Refill  . atorvastatin (LIPITOR) 40 MG tablet Take 1 tablet (40 mg total) by mouth daily.  90 tablet  0  . benazepril (LOTENSIN) 20 MG tablet TAKE 1 TABLET BY MOUTH DAILY  30 tablet  6  . diltiazem (CARDIZEM CD) 240 MG 24 hr capsule TAKE ONE CAPSULE BY MOUTH DAILY  30 capsule  6  . furosemide (LASIX) 40 MG tablet Take 1 tablet (  40 mg total) by mouth daily.  30 tablet  6  . KLOR-CON 10 10 MEQ tablet TAKE 1 TABLET BY MOUTH DAILY  30 tablet  2  . levothyroxine (SYNTHROID, LEVOTHROID) 50 MCG tablet Take 1 tablet (50 mcg total) by mouth daily.  90 tablet  0  . metoprolol tartrate (LOPRESSOR) 25 MG tablet Take 1 tablet (25 mg total) by mouth 2 (two) times daily.  60 tablet  6  . omeprazole (PRILOSEC) 20 MG capsule Take 1 capsule (20 mg total) by mouth 2 (two) times daily.      . Cholecalciferol (VITAMIN D) 1000 UNITS capsule Take 2,000 Units by mouth daily.       . nitroGLYCERIN (NITROSTAT) 0.4 MG SL tablet Place 1 tablet (0.4 mg total) under the tongue every 5 (five) minutes as needed for chest pain (up to 3 doses).  25 tablet  3   No current facility-administered medications  on file prior to visit.   Allergies  Allergen Reactions  . Augmentin (Amoxicillin-Pot Clavulanate)     Abdominal pain   ROS:  Denies fevers, URI symptoms (had recent cold, which resolved).  Denies shortness of breath, rare cough.  Denies edema, skin lesions, depression, anxiety, insomnia, chest pain, headaches, dizziness or other concerns.  PHYSICAL EXAM: BP 120/70  Pulse 68  Ht 5\' 8"  (1.727 m)  Wt 249 lb (112.946 kg)  BMI 37.87 kg/m2 Pleasant obese female in no distress HEENT:  Sclera anicteric.  OP clear Neck: no lymphadenopathy, thyromegaly or carotid bruit Heart: regular rate and rhythm without murmur Lungs: clear bilaterally Abdomen: obese, soft, nontender no mass Extremities: Feet have bluish discolored hyperpigmentation of shins and feet (superficial veins, and venous stasis changes). No edema, normal pulse. Medicated bandaid for a wart on her left lateral foot Normal monofilament sensation Psych: normal mood, affect, hygiene and grooming.  Smells like cigarette smoke (passive from husband who smokes)  Lab Results  Component Value Date   HGBA1C 5.8* 09/01/2012   Lab Results  Component Value Date   CHOL 172 09/01/2012   HDL 62 09/01/2012   LDLCALC 93 09/01/2012   TRIG 87 09/01/2012   CHOLHDL 2.8 09/01/2012   Lab Results  Component Value Date   TSH 1.618 09/01/2012   Lab Results  Component Value Date   ALT 14 09/01/2012   AST 17 09/01/2012   ALKPHOS 89 09/01/2012   BILITOT 0.7 09/01/2012    ASSESSMENT/PLAN:  Unspecified hypothyroidism - Plan: TSH  Tobacco use disorder  Type II or unspecified type diabetes mellitus without mention of complication, not stated as uncontrolled - Plan: Hemoglobin A1c, Comprehensive metabolic panel  Essential hypertension, benign - Plan: Comprehensive metabolic panel  Obesity (BMI 30-39.9)  Other malaise and fatigue - Plan: CBC with Differential, Comprehensive metabolic panel, Vitamin D 25 hydroxy, TSH  Pure hypercholesterolemia -  Plan: Lipid panel  Preventative--pt is long past due for CPE. Check date and type of last tetanus--if les than 10 years, but didn't have the Pertussis (if just Td, and not TdaP) then will need TdaP.  Medicare doesn't pay for tetanus, so you might want to get that at a nurse visit prior to turning 67. Check for shingles coverage--you might want to get prior to turning 67--might cost less out of pocket. Will need pneumovax at next visit (when age 61)  DM--controlled with diet HTN--well controlled Smoking--pretty much quit.  Encouraged complete cessation, and free counseling resources reviewed. Obesity--encouraged continued efforts at weight loss, so as not to regain more  weight;  Needs to lose further. Hyperlipidemia--levels higher than when on nutrasystem at last check, but still at goal.  Continue lowfat, low cholesterol diet and statin  Pharm to call when refills needed (likely in 6-8 weeks) and okay to refill x 6 months  F/u at CPE/pap within 6 months Recommend [place] for zostavax and Tdap (before age 4) soon Give pneumovax after 23 (at [facility])

## 2012-10-24 ENCOUNTER — Other Ambulatory Visit: Payer: Self-pay | Admitting: *Deleted

## 2012-10-24 MED ORDER — POTASSIUM CHLORIDE ER 10 MEQ PO TBCR: EXTENDED_RELEASE_TABLET | ORAL | Status: DC

## 2012-10-28 ENCOUNTER — Other Ambulatory Visit: Payer: Self-pay | Admitting: *Deleted

## 2012-10-28 MED ORDER — METOPROLOL TARTRATE 25 MG PO TABS: 25.0000 mg | ORAL_TABLET | Freq: Two times a day (BID) | ORAL | Status: DC

## 2012-10-28 NOTE — Telephone Encounter (Signed)
Fax Received. Refill Completed. Earlee Herald Chowoe (R.M.A)   

## 2012-10-29 ENCOUNTER — Other Ambulatory Visit: Payer: Self-pay | Admitting: Family Medicine

## 2012-11-12 ENCOUNTER — Other Ambulatory Visit: Payer: Self-pay | Admitting: Family Medicine

## 2013-01-11 ENCOUNTER — Other Ambulatory Visit: Payer: Self-pay | Admitting: Physician Assistant

## 2013-01-28 ENCOUNTER — Other Ambulatory Visit: Payer: Self-pay

## 2013-02-25 ENCOUNTER — Other Ambulatory Visit: Payer: Self-pay

## 2013-02-25 MED ORDER — POTASSIUM CHLORIDE ER 10 MEQ PO TBCR: EXTENDED_RELEASE_TABLET | ORAL | Status: DC

## 2013-02-25 MED ORDER — DILTIAZEM HCL ER COATED BEADS 240 MG PO CP24: ORAL_CAPSULE | ORAL | Status: DC

## 2013-02-25 MED ORDER — BENAZEPRIL HCL 20 MG PO TABS: ORAL_TABLET | ORAL | Status: DC

## 2013-03-04 ENCOUNTER — Other Ambulatory Visit: Payer: Medicare Other

## 2013-03-04 DIAGNOSIS — E559 Vitamin D deficiency, unspecified: Secondary | ICD-10-CM | POA: Diagnosis not present

## 2013-03-04 DIAGNOSIS — I10 Essential (primary) hypertension: Secondary | ICD-10-CM | POA: Diagnosis not present

## 2013-03-04 DIAGNOSIS — E7800 Pure hypercholesterolemia, unspecified: Secondary | ICD-10-CM

## 2013-03-04 DIAGNOSIS — E039 Hypothyroidism, unspecified: Secondary | ICD-10-CM | POA: Diagnosis not present

## 2013-03-04 DIAGNOSIS — E119 Type 2 diabetes mellitus without complications: Secondary | ICD-10-CM | POA: Diagnosis not present

## 2013-03-04 DIAGNOSIS — R5381 Other malaise: Secondary | ICD-10-CM | POA: Diagnosis not present

## 2013-03-04 LAB — COMPREHENSIVE METABOLIC PANEL
ALT: 12 U/L (ref 0–35)
AST: 15 U/L (ref 0–37)
Albumin: 4.1 g/dL (ref 3.5–5.2)
Calcium: 9.5 mg/dL (ref 8.4–10.5)
Chloride: 103 mEq/L (ref 96–112)
Potassium: 4.5 mEq/L (ref 3.5–5.3)

## 2013-03-04 LAB — CBC WITH DIFFERENTIAL/PLATELET
Basophils Relative: 1 % (ref 0–1)
Eosinophils Absolute: 0.3 10*3/uL (ref 0.0–0.7)
Hemoglobin: 13.4 g/dL (ref 12.0–15.0)
MCH: 30.7 pg (ref 26.0–34.0)
MCHC: 33.9 g/dL (ref 30.0–36.0)
Monocytes Absolute: 0.6 10*3/uL (ref 0.1–1.0)
Monocytes Relative: 8 % (ref 3–12)
Neutrophils Relative %: 63 % (ref 43–77)

## 2013-03-04 LAB — HEMOGLOBIN A1C: Hgb A1c MFr Bld: 6 % — ABNORMAL HIGH (ref <5.7)

## 2013-03-04 LAB — LIPID PANEL
LDL Cholesterol: 90 mg/dL (ref 0–99)
VLDL: 21 mg/dL (ref 0–40)

## 2013-03-05 LAB — TSH: TSH: 1.97 u[IU]/mL (ref 0.350–4.500)

## 2013-03-09 ENCOUNTER — Encounter: Payer: Self-pay | Admitting: Family Medicine

## 2013-03-09 ENCOUNTER — Ambulatory Visit (INDEPENDENT_AMBULATORY_CARE_PROVIDER_SITE_OTHER): Payer: Medicare Other | Admitting: Family Medicine

## 2013-03-09 ENCOUNTER — Other Ambulatory Visit (HOSPITAL_COMMUNITY)
Admission: RE | Admit: 2013-03-09 | Discharge: 2013-03-09 | Disposition: A | Discharge status: Home / Self Care | Payer: Medicare Other | Source: Ambulatory Visit | Attending: Family Medicine | Admitting: Family Medicine

## 2013-03-09 VITALS — BP 138/64 | HR 76 | Ht 68.0 in | Wt 295.0 lb

## 2013-03-09 DIAGNOSIS — Z23 Encounter for immunization: Secondary | ICD-10-CM | POA: Diagnosis not present

## 2013-03-09 DIAGNOSIS — Z1151 Encounter for screening for human papillomavirus (HPV): Secondary | ICD-10-CM | POA: Insufficient documentation

## 2013-03-09 DIAGNOSIS — Z0000 Encounter for general adult medical examination without abnormal findings: Secondary | ICD-10-CM

## 2013-03-09 DIAGNOSIS — E7800 Pure hypercholesterolemia, unspecified: Secondary | ICD-10-CM

## 2013-03-09 DIAGNOSIS — F17200 Nicotine dependence, unspecified, uncomplicated: Secondary | ICD-10-CM

## 2013-03-09 DIAGNOSIS — E119 Type 2 diabetes mellitus without complications: Secondary | ICD-10-CM

## 2013-03-09 DIAGNOSIS — Z01419 Encounter for gynecological examination (general) (routine) without abnormal findings: Secondary | ICD-10-CM | POA: Diagnosis not present

## 2013-03-09 DIAGNOSIS — Z780 Asymptomatic menopausal state: Secondary | ICD-10-CM

## 2013-03-09 DIAGNOSIS — I10 Essential (primary) hypertension: Secondary | ICD-10-CM

## 2013-03-09 DIAGNOSIS — E039 Hypothyroidism, unspecified: Secondary | ICD-10-CM

## 2013-03-09 LAB — POCT URINALYSIS DIPSTICK
Ketones, UA: NEGATIVE
Leukocytes, UA: NEGATIVE
Nitrite, UA: NEGATIVE
Protein, UA: NEGATIVE

## 2013-03-09 LAB — HM PAP SMEAR: HM Pap smear: NEGATIVE

## 2013-03-09 MED ORDER — ATORVASTATIN CALCIUM 40 MG PO TABS: ORAL_TABLET | ORAL | Status: DC

## 2013-03-09 MED ORDER — LEVOTHYROXINE SODIUM 50 MCG PO TABS: 50.0000 ug | ORAL_TABLET | Freq: Every day | ORAL | Status: DC

## 2013-03-09 NOTE — Progress Notes (Signed)
Chief Complaint  Patient presents with  . Welcome to [employer]    and med check. Had labs done last week. No major concerns.    Nancy West is a 67 y.o. female who presents for a welcome to Medicare physical.  She is also here for a med check.  Hypertension follow-up: Blood pressures are not checked elsewhere. Denies dizziness, headaches, chest pain. Denies side effects of medications.   Hyperlipidemia follow-up: Patient is reportedly following a low-fat, low cholesterol diet. She is overeating due to stress, but still trying to watch the cholesterol.  Compliant with medications and denies medication side effects   Hypothyroidism: Compliant with medications, taking an hour before all other medications. No skin/hair/bowel changes.  Sometimes feels a little fatigued.  +weight gain since eating more, not exercising.  +snores, talks in her sleep; denies apnea.  Feels refreshed when she wakes up.  Diabetes/impaired fasting glucose (never has been treated with meds). Doesn't check blood sugars. No polydipsia or polyuria. Checks feet regularly--denies numbness/tingling/sores. Denies swelling of feet. Denies pain in calves or feet.  Pericardial effusion: s/p pericardial window summer 2013.  Sees Dr. Elease Hashimoto, last seen in November. Denies any problems.  Tobacco Abuse:  Restarted smoking about 6 months ago, after quitting for 6 months.  She is at about 1/2 PPD.  She has gained 46 pound since her last visit 6 months ago.  She hasn't been exercising, and has been stress-eating.  She lost weight with [employer]rasystem, then quit smoking.  She quit smoking for 6 months, not really sure why she restarted, but likely related to stress of her mother, whose health has gone down hill. She has 24 hour care, but is calling her constantly.  They are looking into placement for her.  "She is driving me crazy".   Immunization History  Administered Date(s) Administered  . Influenza Split 03/19/2012  . Influenza Whole  04/04/2009  . PPD Test 12/31/2011   Last Pap smear: 10/2000 Last mammogram: 01/31/09 Last colonoscopy: 08/03/05 +polyps; Dr. Loreta Ave.  She was due to f/u in 5 years, but was around the time of her cardiac hospitalization, and she never scheduled it. Last DEXA: never Dentist: yearly Ophtho: yearly, last was a year ago (just at [facility]) Exercise: none--no longer going to Y, riding bike, swimming.  Other doctors caring for patient: Cardiologist:  Dr. Elease Hashimoto Ophtho: [facility] Dentist: Dr. Marcha Solders  End of life discussion:  She has a living will and healthcare power of attorney.  She is Full Code, full care.  Depression screen:  See scanned sheet.  +screen--some decreased energy, blaming self, hopelessness, feeling blue, worthless and decision making troubles. She associates this with the stress of her mother's increasing care and declining health.  Denies SI or SI  ADL questionnaire--negative.  See scanned sheet.  Past Medical History  Diagnosis Date  . Obesity   . Hypertension   . Hyperlipidemia   . Diabetes mellitus     Diet controlled  . Hypothyroidism   . Venous stasis of lower extremity     a. chronic LEE  . Tobacco dependence     a. 46 pack yr hx.  . Chest pain     a. 12/2011 Neg Lexi MV, EF 56%.    . Pericardial effusion     a. Large, s/p pericardiocentesis 01/02/12 yielding fluid (neg malignant cells, only inflammatory cells)  . DOE (dyspnea on exertion)     Chronic  . Anemia     Noted 12/2011  . Atrial  fib/flutter, transient     In setting of pericardial effusion 12/2011  . Diastolic CHF     Past Surgical History  Procedure Laterality Date  . Cholecystectomy  1980  . Appendectomy    . Breast biopsy      a. Benign ~ 20 yr ago.    History   Social History  . Marital Status: Married    Spouse Name: N/A    Number of Children: 0  . Years of Education: N/A   Occupational History  . Retired (Presenter, broadcasting) [employer]   Social History Main Topics  .  Smoking status: Current Every Day Smoker -- 0.50 packs/day for 46 years    Types: Cigarettes  . Smokeless tobacco: Never Used     Comment: 1 ppd since age 6; quit x 7 months with Chantix in past  . Alcohol Use: Yes     Comment: maybe 1 drink per week.  . Drug Use: No  . Sexual Activity: Not Currently   Other Topics Concern  . Not on file   Social History Narrative   Lives in [place] with husband.  Cares for her elderly mother who lives next door    Family History  Problem Relation Age of Onset  . Cancer Mother     breast cancer (70), colon cancer  . Hypertension Mother   . Arthritis Mother   . Anxiety disorder Mother   . Diabetes Father     died in MVA    Current outpatient prescriptions:atorvastatin (LIPITOR) 40 MG tablet, TAKE 1 TABLET (40 MG TOTAL) BY MOUTH DAILY., Disp: 90 tablet, Rfl: 3;  benazepril (LOTENSIN) 20 MG tablet, TAKE 1 TABLET BY MOUTH DAILY, Disp: 30 tablet, Rfl: 3;  Cholecalciferol (VITAMIN D) 1000 UNITS capsule, Take 2,000 Units by mouth daily. , Disp: , Rfl: ;  diltiazem (CARDIZEM CD) 240 MG 24 hr capsule, TAKE ONE CAPSULE BY MOUTH DAILY, Disp: 30 capsule, Rfl: 3 furosemide (LASIX) 40 MG tablet, TAKE 1 TABLET BY MOUTH EVERY DAY, Disp: 30 tablet, Rfl: 1;  levothyroxine (SYNTHROID, LEVOTHROID) 50 MCG tablet, Take 1 tablet (50 mcg total) by mouth daily., Disp: 90 tablet, Rfl: 3;  metoprolol tartrate (LOPRESSOR) 25 MG tablet, Take 1 tablet (25 mg total) by mouth 2 (two) times daily., Disp: 60 tablet, Rfl: 6 omeprazole (PRILOSEC) 20 MG capsule, Take 1 capsule (20 mg total) by mouth 2 (two) times daily., Disp: , Rfl: ;  potassium chloride (KLOR-CON 10) 10 MEQ tablet, TAKE 1 TABLET BY MOUTH DAILY, Disp: 30 tablet, Rfl: 2;  nitroGLYCERIN (NITROSTAT) 0.4 MG SL tablet, Place 1 tablet (0.4 mg total) under the tongue every 5 (five) minutes as needed for chest pain (up to 3 doses)., Disp: 25 tablet, Rfl: 3  Allergies  Allergen Reactions  . Augmentin [Amoxicillin-Pot  Clavulanate]     Abdominal pain    ROS:  The patient denies anorexia, fever, headaches,  vision changes, decreased hearing, ear pain, sore throat, breast concerns, chest pain, palpitations, dizziness, syncope, dyspnea on exertion, cough, swelling, nausea, vomiting, diarrhea, constipation, abdominal pain, melena, hematochezia, indigestion/heartburn, hematuria, incontinence, dysuria, vaginal bleeding, discharge, odor or itch (some odor and discharge her whole life, unchanged), genital lesions, weakness, tremor, suspicious skin lesions, abnormal bleeding/bruising, or enlarged lymph nodes. Hands fall asleep at night when sleeping, or if leaning her elbows on the table, relieved by shaking/moving her arms. Occasional flare of external hemorrhoids. Gets diarrhea occasionally when upset Very slight leakage when bladder is full (mild urge incontinence) Some right hip  pain on rainy days (am's only) +depression (see above).  +anxiety (her personality, unchanged) +weight gain  PHYSICAL EXAM:  BP 152/76  Pulse 76  Ht 5\' 8"  (1.727 m)  Wt 295 lb (133.811 kg)  BMI 44.86 kg/m2 138/64 on repeat by MD General Appearance:    Alert, cooperative, no distress, appears stated age, obese  Head:    Normocephalic, without obvious abnormality, atraumatic  Eyes:    PERRL, conjunctiva/corneas clear, EOM's intact, fundi    benign  Ears:    Normal TM's and external ear canals  Nose:   Nares normal, mucosa normal, no drainage or sinus   tenderness  Throat:   Lips, mucosa, and tongue normal; teeth and gums normal  Neck:   Supple, no lymphadenopathy;  thyroid:  no   enlargement/tenderness/nodules; no carotid   bruit or JVD  Back:    Spine nontender, no curvature, ROM normal, no CVA     tenderness  Lungs:     Clear to auscultation bilaterally without wheezes, rales or     ronchi; respirations unlabored. Some ronchi, clears with cough  Chest Wall:    No tenderness or deformity   Heart:    Regular rate and rhythm, S1  and S2 normal, no murmur, rub   or gallop  Breast Exam:    No tenderness, masses, or nipple discharge or inversion.      No axillary lymphadenopathy.  Seborrheic keratoses at nipples bilaterally.  Some healing skin lesions (healing abscesses) L breast.  Nontender, no drainage.  +scarring of skin  Abdomen:     Soft, non-tender, nondistended, normoactive bowel sounds,    no masses, no hepatosplenomegaly. obese  Genitalia:    Normal external genitalia without lesions.  Mild strophic changes. BUS and vagina normal; cervix without lesions, or cervical motion tenderness. No abnormal vaginal discharge.  Uterus and adnexa not enlarged, although exam is limited due to body habitus; nontender, no masses.  Pap performed  Rectal:    Normal tone, no masses or tenderness; guaiac negative stool  Extremities:   No clubbing, cyanosis or edema  Pulses:   2+ and symmetric all extremities  Skin:   Skin color, texture, turgor normal, no rashes or lesions.  Venous stasis changes with hyperpigmentation at bilateral lower extremities.  No edema  Lymph nodes:   Cervical, supraclavicular, and axillary nodes normal  Neurologic:   CNII-XII intact, normal strength, sensation and gait; reflexes 2+ and symmetric throughout          Psych:   Normal mood, affect, hygiene and grooming.      Lab Results  Component Value Date   HGBA1C 6.0* 03/04/2013   Lab Results  Component Value Date   WBC 7.0 03/04/2013   HGB 13.4 03/04/2013   HCT 39.5 03/04/2013   MCV 90.4 03/04/2013   PLT 156 03/04/2013     Chemistry      Component Value Date/Time   NA 143 03/04/2013 1005   K 4.5 03/04/2013 1005   CL 103 03/04/2013 1005   CO2 28 03/04/2013 1005   BUN 33* 03/04/2013 1005   CREATININE 1.04 03/04/2013 1005   CREATININE 0.9 01/21/2012 1549      Component Value Date/Time   CALCIUM 9.5 03/04/2013 1005   ALKPHOS 96 03/04/2013 1005   AST 15 03/04/2013 1005   ALT 12 03/04/2013 1005   BILITOT 0.6 03/04/2013 1005     Lab Results  Component  Value Date   TSH 1.970 03/04/2013   Lab Results  Component Value Date   CHOL 177 03/04/2013   HDL 66 03/04/2013   LDLCALC 90 03/04/2013   TRIG 104 03/04/2013   CHOLHDL 2.7 03/04/2013    Glucose 106 Vitamin D-OH 37  EKG today--NSR, rate 71.  Occasional PVC.  No acute abnormalities  ASSESSMENT/PLAN:  Welcome to Medicare preventive visit - Plan: Visual acuity screening, POCT Urinalysis Dipstick, EKG 12-Lead, DG Bone Density, Cytology - PAP [place]  Essential hypertension, benign - borderline control.  daily exercise, low sodium diet and weight loss recommended  Pure hypercholesterolemia - controlled on current regimen - Plan: atorvastatin (LIPITOR) 40 MG tablet  Tobacco use disorder - restarted after quitting x 6 months.  reviewed available resources.  She asked about Chantix--don't recommend; she quit without in past.  side effects reviewed - Plan: DG Bone Density  Type II or unspecified type diabetes mellitus without mention of complication, not stated as uncontrolled - controlled with diet, not on meds.  Discussed risk of worsening sugars with weight gain, poor diet.  counseled extensively  Unspecified hypothyroidism - normal TSH. continue current dose.  weight gain is not related to her thyroid.  - Plan: DG Bone Density, levothyroxine (SYNTHROID, LEVOTHROID) 50 MCG tablet  Morbid obesity with body mass index of 40.0-49.9 - discussed risks of obesity; increase exercise, stop stress eating--counseling for stress reduction.  daily exercise  Need for prophylactic vaccination and inoculation against influenza - Plan: Flu Vaccine QUAD 36+ mos PF IM (Fluarix)  Need for Tdap vaccination - she is aware this isn't covered by Medicare and willing to pay (she was told to come in prior to change in insurance to get) - Plan: Tdap vaccine greater than or equal to 7yo IM  Need for prophylactic vaccination against Streptococcus pneumoniae (pneumococcus) - Plan: Pneumococcal conjugate vaccine  13-valent less than 5yo IM  Postmenopausal - Plan: DG Bone Density  Routine gynecological examination - Plan: Cytology - PAP [place]   Discussed monthly self breast exams and yearly mammograms after the age of 24; at least 30 minutes of aerobic activity at least 5 days/week; proper sunscreen use reviewed; healthy diet, including goals of calcium and vitamin D intake and alcohol recommendations (less than or equal to 1 drink/day) reviewed; regular seatbelt use; changing batteries in smoke detectors.  Immunization recommendations discussed--see below.  Colonoscopy recommendations reviewed--past due for recheck; advised to schedule  prevnar 13 today Flu shot Needs Tdap--not covered by Medicare. She was aware that she had been asked to return to get it prior to age 77, but never did, and she is willing to pay for it. Check zostavax coverage and get from pharmacy, if desired.  DEXA recommended, order placed.  Call to schedule f/u colonoscopy (past due, due in 2012). Call to schedule mammogram ([facility])--see if you can schedule for the same time as your bone density. Schedule diabetic eye exam with MD (not just [facility])  Mild depression, related to situational stresses related to her mother's decline in health.  Counseling is strongly encouraged, as well as looking into support groups. Return to discuss medications if anxiety and moods are not improving after mother is placed in assisted living. Periodically check BP elsewhere as it was running high today, need to know if it is typically running as high.  Encouraged to return in 3 months, but she declined.  Will return in 6 months

## 2013-03-09 NOTE — Patient Instructions (Addendum)
HEALTH MAINTENANCE RECOMMENDATIONS:  It is recommended that you get at least 30 minutes of aerobic exercise at least 5 days/week (for weight loss, you may need as much as 60-90 minutes). This can be any activity that gets your heart rate up. This can be divided in 10-15 minute intervals if needed, but try and build up your endurance at least once a week.  Weight bearing exercise is also recommended twice weekly.  Eat a healthy diet with lots of vegetables, fruits and fiber.  "Colorful" foods have a lot of vitamins (ie green vegetables, tomatoes, red peppers, etc).  Limit sweet tea, regular sodas and alcoholic beverages, all of which has a lot of calories and sugar.  Up to 1 alcoholic drink daily may be beneficial for women (unless trying to lose weight, watch sugars).  Drink a lot of water.  Calcium recommendations are 1200-1500 mg daily (1500 mg for postmenopausal women or women without ovaries), and vitamin D 1000 IU daily.  This should be obtained from diet and/or supplements (vitamins), and calcium should not be taken all at once, but in divided doses.  Monthly self breast exams and yearly mammograms for women over the age of 72 is recommended.  Sunscreen of at least SPF 30 should be used on all sun-exposed parts of the skin when outside between the hours of 10 am and 4 pm (not just when at beach or pool, but even with exercise, golf, tennis, and yard work!)  Use a sunscreen that says "broad spectrum" so it covers both UVA and UVB rays, and make sure to reapply every 1-2 hours.  Remember to change the batteries in your smoke detectors when changing your clock times in the spring and fall.  Use your seat belt every time you are in a car, and please drive safely and not be distracted with cell phones and texting while driving.   Call to schedule f/u colonoscopy (past due, due in 2012). Call to schedule mamm[facility] ([facility])--see if you can schedule for the same time as your bone  density. Schedule diabetic eye exam with MD (not just [facility])  Counseling is strongly encouraged, as well as looking into support groups. Return to discuss medications if anxiety and moods are not improving after mother is placed in assisted living. Periodically check BP elsewhere as it was running high today, need to know if it is typically running as high.  Get your shingles vaccine at the pharmacy (if desired)--we can no longer give it to you in the office (because it is a pharmacy benefit through Medicare).

## 2013-03-10 ENCOUNTER — Other Ambulatory Visit: Payer: Self-pay | Admitting: Cardiovascular Disease

## 2013-03-11 ENCOUNTER — Telehealth: Payer: Self-pay | Admitting: Internal Medicine

## 2013-03-11 DIAGNOSIS — Z23 Encounter for immunization: Secondary | ICD-10-CM

## 2013-03-11 DIAGNOSIS — Z01419 Encounter for gynecological examination (general) (routine) without abnormal findings: Secondary | ICD-10-CM | POA: Diagnosis not present

## 2013-03-11 DIAGNOSIS — F17200 Nicotine dependence, unspecified, uncomplicated: Secondary | ICD-10-CM | POA: Diagnosis not present

## 2013-03-11 DIAGNOSIS — Z0000 Encounter for general adult medical examination without abnormal findings: Secondary | ICD-10-CM | POA: Diagnosis not present

## 2013-03-11 DIAGNOSIS — I10 Essential (primary) hypertension: Secondary | ICD-10-CM | POA: Diagnosis not present

## 2013-03-11 DIAGNOSIS — E7800 Pure hypercholesterolemia, unspecified: Secondary | ICD-10-CM | POA: Diagnosis not present

## 2013-03-11 NOTE — Telephone Encounter (Signed)
Ok for rx for zostavax-see if we can just fax order to her pharmacy if she prefers to do it that way, vs having her pick up written rx

## 2013-03-11 NOTE — Telephone Encounter (Signed)
Faxed

## 2013-03-11 NOTE — Telephone Encounter (Signed)
Pt is going to her pharmacy to get a shingles shot but has to have an order for it. Please call pt when ready

## 2013-03-12 ENCOUNTER — Encounter: Payer: Self-pay | Admitting: Internal Medicine

## 2013-03-12 ENCOUNTER — Encounter: Payer: Self-pay | Admitting: Family Medicine

## 2013-04-07 ENCOUNTER — Other Ambulatory Visit: Payer: Self-pay | Admitting: Cardiovascular Disease

## 2013-05-04 ENCOUNTER — Ambulatory Visit (INDEPENDENT_AMBULATORY_CARE_PROVIDER_SITE_OTHER): Payer: Medicare Other | Admitting: Cardiovascular Disease

## 2013-05-04 ENCOUNTER — Encounter: Payer: Self-pay | Admitting: Cardiovascular Disease

## 2013-05-04 VITALS — BP 136/64 | HR 72 | Ht 68.0 in | Wt 308.0 lb

## 2013-05-04 DIAGNOSIS — I5032 Chronic diastolic (congestive) heart failure: Secondary | ICD-10-CM

## 2013-05-04 DIAGNOSIS — I309 Acute pericarditis, unspecified: Secondary | ICD-10-CM | POA: Diagnosis not present

## 2013-05-04 NOTE — Progress Notes (Signed)
Nancy West Date of Birth  [birth year] [facility] / [facility] [address]     [address] [place], [place]  [address] [phone]  Fax  [phone]  Problem List: 1. PVCs 2. Pericardial effusion  3. Atrial Fibrillation 4. Hypothyroidism 5. Hyperlipidemia  History of Present Illness:  Nancy West is a 67 year old female who is a previous patient of Dr. Reyes Ivan. She has a history of morbid obesity, diastolic dysfunction, hypertension, and cigarette smoking. She presents today for followup of her palpitations. She was hospitalized earlier this summer with pericardial effusion and had a pericardial window.  She has a history of hypertension. Blood pressure has been fairly good and her home meds.   It is a bit elevated today. She has white coat hypertension. She denies any chest pain or shortness breath.  She's doing water aerobics on occasion. 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 Unfortunately, she continues to smoke. We placed an event monitor on her during her last visit. She was found to have numerous premature ventricular contractions. There was no evidence of atrial fibrillation.  Nov. 10, 2014:  Nancy West is doing well.  She is starting exercising again.  On the treadmill.  She had a pericardial effusion  In June 2013 - s/p pericardial tap   Current Outpatient Prescriptions on File Prior to Visit  Medication Sig Dispense Refill  . atorvastatin (LIPITOR) 40 MG tablet TAKE 1 TABLET (40 MG TOTAL) BY MOUTH DAILY.  90 tablet  3  . benazepril (LOTENSIN) 20 MG tablet TAKE 1 TABLET BY MOUTH DAILY  30 tablet  3  . Cholecalciferol (VITAMIN D) 1000 UNITS capsule Take 2,000 Units by mouth daily.       Marland Kitchen diltiazem (CARDIZEM CD) 240 MG 24 hr capsule TAKE ONE CAPSULE BY MOUTH DAILY  30 capsule  3  . furosemide (LASIX) 40 MG tablet TAKE 1 TABLET BY MOUTH EVERY DAY  30 tablet  0  . levothyroxine (SYNTHROID, LEVOTHROID) 50 MCG tablet Take 1 tablet (50 mcg  total) by mouth daily.  90 tablet  3  . metoprolol tartrate (LOPRESSOR) 25 MG tablet Take 1 tablet (25 mg total) by mouth 2 (two) times daily.  60 tablet  6  . omeprazole (PRILOSEC) 20 MG capsule Take 1 capsule (20 mg total) by mouth 2 (two) times daily.      . potassium chloride (KLOR-CON 10) 10 MEQ tablet TAKE 1 TABLET BY MOUTH DAILY  30 tablet  2  . nitroGLYCERIN (NITROSTAT) 0.4 MG SL tablet Place 1 tablet (0.4 mg total) under the tongue every 5 (five) minutes as needed for chest pain (up to 3 doses).  25 tablet  3   No current facility-administered medications on file prior to visit.    Allergies  Allergen Reactions  . Augmentin [Amoxicillin-Pot Clavulanate]     Abdominal pain    Past Medical History  Diagnosis Date  . Obesity   . Hypertension   . Hyperlipidemia   . Diabetes mellitus     Diet controlled  . Hypothyroidism   . Venous stasis of lower extremity     a. chronic LEE  . Tobacco dependence     a. 46 pack yr hx.  . Chest pain     a. 12/2011 Neg Lexi MV, EF 56%.    . Pericardial effusion     a. Large, s/p pericardiocentesis 01/02/12 yielding fluid (neg malignant cells, only inflammatory cells)  . DOE (dyspnea on exertion)  Chronic  . Anemia     Noted 12/2011  . Atrial fib/flutter, transient     In setting of pericardial effusion 12/2011  . Diastolic CHF     Past Surgical History  Procedure Laterality Date  . Cholecystectomy  1980  . Appendectomy    . Breast biopsy      a. Benign ~ 20 yr ago.    History  Smoking status  . Current Every Day Smoker -- 0.50 packs/day for 46 years  . Types: Cigarettes  Smokeless tobacco  . Never Used    Comment: 1 ppd since age 15; quit x 7 months with Chantix in past    History  Alcohol Use  . Yes    Comment: maybe 1 drink per week.    Family History  Problem Relation Age of Onset  . Cancer Mother     breast cancer (70), colon cancer  . Hypertension Mother   . Arthritis Mother   . Anxiety disorder Mother    . Diabetes Father     died in MVA    Reviw of Systems:  Reviewed in the HPI.  All other systems are negative.  Physical Exam: BP 136/64  Pulse 72  Ht 5\' 8"  (1.727 m)  Wt 308 lb (139.708 kg)  BMI 46.84 kg/m2 The patient is alert and oriented x 3.  The mood and affect are normal.   Skin: warm and dry.  Color is normal.    HEENT:   the sclera are nonicteric.  The mucous membranes are moist.  The carotids are 2+ without bruits.  There is no thyromegaly.  There is no JVD.    Lungs: clear.  The chest wall is non tender.    Heart: regular rate with a normal S1 and S2.  There are no murmurs, gallops, or rubs. The PMI is not displaced.     Abdomen: She is morbidly obese. good bowel sounds.  There is no guarding or rebound.  There is no hepatosplenomegaly or tenderness.  There are no masses.   Extremities:  There is trace edema.  She has chronic stasis changes.     Neuro:  Cranial nerves II - XII are intact.  Motor and sensory functions are intact.    The gait is normal.  ECG:  Assessment / Plan:

## 2013-05-04 NOTE — Assessment & Plan Note (Signed)
Is no evidence of recurrent pericardial effusion.

## 2013-05-04 NOTE — Patient Instructions (Addendum)
Your physician wants you to follow-up in: 1 YEAR  You will receive a reminder letter in the mail two months in advance. If you don't receive a letter, please call our office to schedule the follow-up appointment.  Your physician recommends that you continue on your current medications as directed. Please refer to the Current Medication list given to you today.   [facility] Low Glycemic Diet (Source: [facility], 2006) Low Glycemic Foods (20-49) (Decrease risk of developing heart disease) Breakfast Cereals: All-Bran All-Bran Fruit 'n Oats Fiber One Oatmeal (not instant) Oat bran Fruits and fruit juices: (Limit to 1-2 servings per day) Apples Apricots (fresh & dried) Blackberries Blueberries Cherries Cranberries Peaches Pears Plums Prunes Grapefruit Raspberries Strawberries Tangerine Apple juice Grapefruit juice Tomato juice Beans and legumes (fresh-cooked): Black-eyed peas Butter beans Chick peas Lentils  Green beans Lima beans Kidney beans Navy beans Pinto beans Snow peas Non-starchy vegetables: Asparagus, avocado, broccoli, cabbage, cauliflower, celery, cucumber, greens, lettuce, mushrooms, peppers, tomatoes, okra, onions, spinach, summer squash Grains: Barley Bulgur Rye Wild rice Nuts and oils : Almonds Peanuts Sunflower seeds Hazelnuts Pecans Walnuts Oils that are liquid at room temperature Dairy, fish, meat, soy, and eggs: Milk, skim Lowfat cheese Yogurt, lowfat, fruit sugar sweetened Lean red meat Fish  Skinless chicken & turkey Shellfish Egg whites (up to 3 daily) Soy products  Egg yolks (up to 7 or _____ per week) Moderate Glycemic Foods (50-69) Breakfast Cereals: Bran Buds Bran Chex Just Right Mini-Wheats  Special K Swiss muesli Fruits: Banana (under-ripe) Dates Figs Grapes Kiwi Mango Oranges Raisins Fruit Juices: Cranberry juice Orange juice Beans and legumes: Boston-type baked beans Canned pinto, kidney, or navy beans Green  peas Vegetables: Beets Carrots  Sweet potato Yam Corn on the cob Breads: Pita (pocket) bread Oat bran bread Pumpernickel bread Rye bread Wheat bread, high fiber  Grains: Cornmeal Rice, brown Rice, white Couscous Pasta: Macaroni Pizza, cheese Ravioli, meat filled Spaghetti, white  Nuts: Cashews Macadamia Snacks: Chocolate Ice cream, lowfat Muffin Popcorn High Glycemic Foods (70-100)  Breakfast Cereals: Cheerios Corn Chex Corn Flakes Cream of Wheat Grape Nuts Grape Nut Flakes Grits Nutri-Grain Puffed Rice Puffed Wheat Rice Chex Rice Krispies Shredded Wheat Team Total Fruits: Pineapple Watermelon Banana (over-ripe) Beverages: Sodas, sweet tea, pineapple juice Vegetables: Potato, baked, boiled, fried, mashed French fries Canned or frozen corn Parsnips Winter squash Breads: Most breads (white and whole grain) Bagels Bread sticks Bread stuffing Kaiser roll Dinner rolls Grains: Rice, instant Tapioca, with milk Candy and most cookies Snacks: Donuts Corn chips Jelly beans Pretzels Pastries     

## 2013-05-04 NOTE — Assessment & Plan Note (Signed)
I encouraged her to work on a good diet, exercise, and weight loss program.

## 2013-05-06 ENCOUNTER — Other Ambulatory Visit: Payer: Self-pay | Admitting: Cardiovascular Disease

## 2013-05-23 ENCOUNTER — Other Ambulatory Visit: Payer: Self-pay | Admitting: Cardiovascular Disease

## 2013-06-01 ENCOUNTER — Encounter: Payer: Self-pay | Admitting: Internal Medicine

## 2013-06-04 ENCOUNTER — Encounter: Payer: Self-pay | Admitting: *Deleted

## 2013-06-20 ENCOUNTER — Other Ambulatory Visit: Payer: Self-pay | Admitting: Cardiovascular Disease

## 2013-09-01 ENCOUNTER — Emergency Department (HOSPITAL_BASED_OUTPATIENT_CLINIC_OR_DEPARTMENT_OTHER): Payer: Medicare Other

## 2013-09-01 ENCOUNTER — Other Ambulatory Visit: Payer: Self-pay

## 2013-09-01 ENCOUNTER — Encounter (HOSPITAL_BASED_OUTPATIENT_CLINIC_OR_DEPARTMENT_OTHER): Payer: Self-pay | Admitting: Emergency Medicine

## 2013-09-01 ENCOUNTER — Inpatient Hospital Stay (HOSPITAL_BASED_OUTPATIENT_CLINIC_OR_DEPARTMENT_OTHER)
Admission: EM | Admit: 2013-09-01 | Discharge: 2013-09-05 | DRG: 291 | Disposition: A | Discharge status: Home / Self Care | Payer: Medicare Other | Attending: Internal Medicine | Admitting: Internal Medicine

## 2013-09-01 DIAGNOSIS — Z818 Family history of other mental and behavioral disorders: Secondary | ICD-10-CM | POA: Diagnosis not present

## 2013-09-01 DIAGNOSIS — I5033 Acute on chronic diastolic (congestive) heart failure: Principal | ICD-10-CM | POA: Diagnosis present

## 2013-09-01 DIAGNOSIS — E669 Obesity, unspecified: Secondary | ICD-10-CM | POA: Diagnosis present

## 2013-09-01 DIAGNOSIS — Z800 Family history of malignant neoplasm of digestive organs: Secondary | ICD-10-CM | POA: Diagnosis not present

## 2013-09-01 DIAGNOSIS — Z6841 Body Mass Index (BMI) 40.0 and over, adult: Secondary | ICD-10-CM | POA: Diagnosis not present

## 2013-09-01 DIAGNOSIS — Z8249 Family history of ischemic heart disease and other diseases of the circulatory system: Secondary | ICD-10-CM

## 2013-09-01 DIAGNOSIS — Z803 Family history of malignant neoplasm of breast: Secondary | ICD-10-CM

## 2013-09-01 DIAGNOSIS — J189 Pneumonia, unspecified organism: Secondary | ICD-10-CM | POA: Diagnosis not present

## 2013-09-01 DIAGNOSIS — R079 Chest pain, unspecified: Secondary | ICD-10-CM | POA: Diagnosis present

## 2013-09-01 DIAGNOSIS — I4891 Unspecified atrial fibrillation: Secondary | ICD-10-CM | POA: Diagnosis not present

## 2013-09-01 DIAGNOSIS — I5032 Chronic diastolic (congestive) heart failure: Secondary | ICD-10-CM | POA: Diagnosis not present

## 2013-09-01 DIAGNOSIS — F17200 Nicotine dependence, unspecified, uncomplicated: Secondary | ICD-10-CM | POA: Diagnosis present

## 2013-09-01 DIAGNOSIS — I872 Venous insufficiency (chronic) (peripheral): Secondary | ICD-10-CM | POA: Diagnosis present

## 2013-09-01 DIAGNOSIS — E785 Hyperlipidemia, unspecified: Secondary | ICD-10-CM | POA: Diagnosis present

## 2013-09-01 DIAGNOSIS — J9600 Acute respiratory failure, unspecified whether with hypoxia or hypercapnia: Secondary | ICD-10-CM | POA: Diagnosis present

## 2013-09-01 DIAGNOSIS — Z833 Family history of diabetes mellitus: Secondary | ICD-10-CM

## 2013-09-01 DIAGNOSIS — E119 Type 2 diabetes mellitus without complications: Secondary | ICD-10-CM | POA: Diagnosis present

## 2013-09-01 DIAGNOSIS — I517 Cardiomegaly: Secondary | ICD-10-CM | POA: Diagnosis not present

## 2013-09-01 DIAGNOSIS — R0600 Dyspnea, unspecified: Secondary | ICD-10-CM | POA: Diagnosis present

## 2013-09-01 DIAGNOSIS — I509 Heart failure, unspecified: Secondary | ICD-10-CM | POA: Diagnosis not present

## 2013-09-01 DIAGNOSIS — K219 Gastro-esophageal reflux disease without esophagitis: Secondary | ICD-10-CM | POA: Diagnosis present

## 2013-09-01 DIAGNOSIS — I10 Essential (primary) hypertension: Secondary | ICD-10-CM | POA: Diagnosis not present

## 2013-09-01 DIAGNOSIS — E039 Hypothyroidism, unspecified: Secondary | ICD-10-CM | POA: Diagnosis present

## 2013-09-01 DIAGNOSIS — J984 Other disorders of lung: Secondary | ICD-10-CM | POA: Diagnosis not present

## 2013-09-01 DIAGNOSIS — J9601 Acute respiratory failure with hypoxia: Secondary | ICD-10-CM

## 2013-09-01 DIAGNOSIS — R0602 Shortness of breath: Secondary | ICD-10-CM | POA: Diagnosis not present

## 2013-09-01 HISTORY — DX: Pneumonia, unspecified organism: J18.9

## 2013-09-01 HISTORY — DX: Gastro-esophageal reflux disease without esophagitis: K21.9

## 2013-09-01 LAB — CBC
HCT: 45.7 % (ref 36.0–46.0)
Hemoglobin: 14.4 g/dL (ref 12.0–15.0)
MCH: 30.1 pg (ref 26.0–34.0)
MCHC: 31.5 g/dL (ref 30.0–36.0)
MCV: 95.4 fL (ref 78.0–100.0)
PLATELETS: 147 10*3/uL — AB (ref 150–400)
RBC: 4.79 MIL/uL (ref 3.87–5.11)
RDW: 15.5 % (ref 11.5–15.5)
WBC: 9.6 10*3/uL (ref 4.0–10.5)

## 2013-09-01 LAB — CBC WITH DIFFERENTIAL/PLATELET
BASOS ABS: 0.1 10*3/uL (ref 0.0–0.1)
Basophils Relative: 1 % (ref 0–1)
Eosinophils Absolute: 0.1 10*3/uL (ref 0.0–0.7)
Eosinophils Relative: 2 % (ref 0–5)
HEMATOCRIT: 48.1 % — AB (ref 36.0–46.0)
Hemoglobin: 14.7 g/dL (ref 12.0–15.0)
Lymphocytes Relative: 16 % (ref 12–46)
Lymphs Abs: 1.4 10*3/uL (ref 0.7–4.0)
MCH: 29.4 pg (ref 26.0–34.0)
MCHC: 30.6 g/dL (ref 30.0–36.0)
MCV: 96.2 fL (ref 78.0–100.0)
Monocytes Absolute: 0.8 10*3/uL (ref 0.1–1.0)
Monocytes Relative: 9 % (ref 3–12)
NEUTROS ABS: 6.4 10*3/uL (ref 1.7–7.7)
Neutrophils Relative %: 73 % (ref 43–77)
Platelets: 157 10*3/uL (ref 150–400)
RBC: 5 MIL/uL (ref 3.87–5.11)
RDW: 15.6 % — AB (ref 11.5–15.5)
WBC: 8.8 10*3/uL (ref 4.0–10.5)

## 2013-09-01 LAB — CREATININE, SERUM
CREATININE: 1.62 mg/dL — AB (ref 0.50–1.10)
GFR, EST AFRICAN AMERICAN: 37 mL/min — AB (ref >90)
GFR, EST NON AFRICAN AMERICAN: 32 mL/min — AB (ref >90)

## 2013-09-01 LAB — BASIC METABOLIC PANEL
BUN: 37 mg/dL — ABNORMAL HIGH (ref 6–23)
CO2: 32 mEq/L (ref 19–32)
Calcium: 9.3 mg/dL (ref 8.4–10.5)
Chloride: 99 mEq/L (ref 96–112)
Creatinine, Ser: 1.5 mg/dL — ABNORMAL HIGH (ref 0.50–1.10)
GFR calc Af Amer: 41 mL/min — ABNORMAL LOW (ref >90)
GFR calc non Af Amer: 35 mL/min — ABNORMAL LOW (ref >90)
Glucose, Bld: 110 mg/dL — ABNORMAL HIGH (ref 70–99)
Potassium: 4.5 mEq/L (ref 3.7–5.3)
SODIUM: 143 meq/L (ref 137–147)

## 2013-09-01 LAB — TROPONIN I
Troponin I: <0.3 ng/mL (ref <0.30)
Troponin I: <0.3 ng/mL (ref <0.30)

## 2013-09-01 LAB — PRO B NATRIURETIC PEPTIDE: Pro B Natriuretic peptide (BNP): 7171 pg/mL — ABNORMAL HIGH (ref 0–125)

## 2013-09-01 MED ORDER — METOPROLOL TARTRATE 25 MG PO TABS
25.0000 mg | ORAL_TABLET | Freq: Two times a day (BID) | ORAL | Status: DC
  Administered 2013-09-01 – 2013-09-05 (×8): 25 mg via ORAL
  Filled 2013-09-01 (×9): qty 1

## 2013-09-01 MED ORDER — ASPIRIN EC 81 MG PO TBEC
81.0000 mg | DELAYED_RELEASE_TABLET | Freq: Every day | ORAL | Status: DC
  Administered 2013-09-01 – 2013-09-05 (×5): 81 mg via ORAL
  Filled 2013-09-01 (×5): qty 1

## 2013-09-01 MED ORDER — NITROGLYCERIN 0.4 MG SL SUBL: 0.4000 mg | SUBLINGUAL_TABLET | SUBLINGUAL | Status: DC | PRN

## 2013-09-01 MED ORDER — FUROSEMIDE 10 MG/ML IJ SOLN
40.0000 mg | Freq: Once | INTRAMUSCULAR | Status: AC
  Administered 2013-09-01: 40 mg via INTRAVENOUS
  Filled 2013-09-01: qty 4

## 2013-09-01 MED ORDER — SODIUM CHLORIDE 0.9 % IJ SOLN
3.0000 mL | Freq: Two times a day (BID) | INTRAMUSCULAR | Status: DC
  Administered 2013-09-01 – 2013-09-05 (×8): 3 mL via INTRAVENOUS

## 2013-09-01 MED ORDER — POTASSIUM CHLORIDE CRYS ER 10 MEQ PO TBCR
10.0000 meq | EXTENDED_RELEASE_TABLET | Freq: Every day | ORAL | Status: DC
  Administered 2013-09-02 – 2013-09-05 (×4): 10 meq via ORAL
  Filled 2013-09-01 (×4): qty 1

## 2013-09-01 MED ORDER — CEFTRIAXONE SODIUM 1 G IJ SOLR
INTRAMUSCULAR | Status: AC
  Filled 2013-09-01: qty 10

## 2013-09-01 MED ORDER — SODIUM CHLORIDE 0.9 % IV SOLN: 250.0000 mL | INTRAVENOUS | Status: DC | PRN

## 2013-09-01 MED ORDER — DILTIAZEM HCL ER COATED BEADS 240 MG PO CP24
240.0000 mg | ORAL_CAPSULE | Freq: Every day | ORAL | Status: DC
Start: 2013-09-02 — End: 2013-09-05
  Administered 2013-09-02 – 2013-09-05 (×4): 240 mg via ORAL
  Filled 2013-09-01 (×4): qty 1

## 2013-09-01 MED ORDER — SODIUM CHLORIDE 0.9 % IJ SOLN: 3.0000 mL | INTRAMUSCULAR | Status: DC | PRN

## 2013-09-01 MED ORDER — IOHEXOL 350 MG/ML SOLN
80.0000 mL | Freq: Once | INTRAVENOUS | Status: AC | PRN
  Administered 2013-09-01: 80 mL via INTRAVENOUS

## 2013-09-01 MED ORDER — FUROSEMIDE 10 MG/ML IJ SOLN
40.0000 mg | Freq: Two times a day (BID) | INTRAMUSCULAR | Status: DC
  Administered 2013-09-01 – 2013-09-05 (×8): 40 mg via INTRAVENOUS
  Filled 2013-09-01 (×10): qty 4

## 2013-09-01 MED ORDER — ATORVASTATIN CALCIUM 40 MG PO TABS
40.0000 mg | ORAL_TABLET | Freq: Every day | ORAL | Status: DC
  Administered 2013-09-02 – 2013-09-05 (×4): 40 mg via ORAL
  Filled 2013-09-01 (×4): qty 1

## 2013-09-01 MED ORDER — DEXTROSE 5 % IV SOLN
500.0000 mg | INTRAVENOUS | Status: DC
  Administered 2013-09-01: 500 mg via INTRAVENOUS

## 2013-09-01 MED ORDER — PANTOPRAZOLE SODIUM 40 MG PO TBEC
40.0000 mg | DELAYED_RELEASE_TABLET | Freq: Every day | ORAL | Status: DC
  Administered 2013-09-02 – 2013-09-05 (×4): 40 mg via ORAL
  Filled 2013-09-01 (×4): qty 1

## 2013-09-01 MED ORDER — DEXTROSE 5 % IV SOLN
1.0000 g | INTRAVENOUS | Status: DC
  Administered 2013-09-01: 1 g via INTRAVENOUS

## 2013-09-01 MED ORDER — ENOXAPARIN SODIUM 40 MG/0.4ML ~~LOC~~ SOLN
40.0000 mg | SUBCUTANEOUS | Status: DC
  Administered 2013-09-01 – 2013-09-04 (×4): 40 mg via SUBCUTANEOUS
  Filled 2013-09-01 (×5): qty 0.4

## 2013-09-01 MED ORDER — BENAZEPRIL HCL 20 MG PO TABS
20.0000 mg | ORAL_TABLET | Freq: Every day | ORAL | Status: DC
  Administered 2013-09-02 – 2013-09-05 (×4): 20 mg via ORAL
  Filled 2013-09-01 (×4): qty 1

## 2013-09-01 MED ORDER — LEVOTHYROXINE SODIUM 50 MCG PO TABS
50.0000 ug | ORAL_TABLET | Freq: Every day | ORAL | Status: DC
  Administered 2013-09-02 – 2013-09-05 (×4): 50 ug via ORAL
  Filled 2013-09-01 (×5): qty 1

## 2013-09-01 NOTE — Progress Notes (Signed)
68 year old female with history metabolic syndrome, hypothyroidism, pericarditis presents with one month history of shortness of breath has been worsening over the past one to 2 weeks. She has been complaining primarily of dyspnea on exertion without any chest discomfort. She has not had any fevers or chills but has had a chronic cough for the past month without any hemoptysis. She feels that her cough and congestion are actually improving. She presented to med center [facility] for shortness of breath. Oxygen saturation was noted to be 85% on room air. Chest x-ray showed increased pulmonary vascular engorgement. CT angiogram of the chest was negative for pulmonary embolus but revealed patchy densities in the right upper lobe and bilateral lower lobes. There is no pericardial or pleural effusion. CBC was unremarkable. There was no leukocytosis. BMP was unremarkable except for serum creatinine 1.50. Her BNP was 7171. EKG shows sinus rhythm with T wave inversions V1-V3 which were unchanged from previous EKGs.  The patient was given ceftriaxone and azithromycin in the emergency department. Intravenous dose of furosemide was also administered. Her oxygen saturation improved to 95% on 2 L nasal cannula. Transfer for admission was requested. The patient was admitted for a telemetry bed.  DTat

## 2013-09-01 NOTE — ED Notes (Signed)
PA at bedside.

## 2013-09-01 NOTE — ED Notes (Signed)
Placed on 2 liters nasal cannula. 

## 2013-09-01 NOTE — Progress Notes (Signed)
Pt arrived to 3E12 from [facility].  Vitals taken.  BP 130/58, Temp 97.8, HR 70, R 24, and 90% on 2L/02 via [place].  Pt placed on telemetry and currently NSR with 1st degree HB.  CCMD has been notified and pt info verified.  Attending MD has been made aware of pt arrival to unit.  Pt has been oriented to room and to the unit.  Pt currently resting comfortably in bed and appears in no acute distress.   Nino Glow RN

## 2013-09-01 NOTE — Progress Notes (Signed)
Report has been given to receiving night shift RN who denies any questions or concerns at this time. Nino Glow RN

## 2013-09-01 NOTE — ED Notes (Signed)
Pt ambulatory to restroom

## 2013-09-01 NOTE — ED Notes (Signed)
Attempted to call report to [facility], RN unavailable to take report at this time.

## 2013-09-01 NOTE — ED Notes (Signed)
Patient transported to ct via stretcher per tech.  Soda offered to family at bedside.

## 2013-09-01 NOTE — ED Notes (Signed)
Also reports bilateral chest wall pain.

## 2013-09-01 NOTE — ED Notes (Signed)
Pt reports 4 months ago with cold symptoms, cough, congestion and since this time has been worsening.  BLE edema, sob, worse with exertion.  O2 sats on RA 85% after walking to room.

## 2013-09-01 NOTE — ED Provider Notes (Signed)
CSN: [id number]     Arrival date & time 09/01/13  1254 History   First MD Initiated Contact with Patient 09/01/13 1333     Chief Complaint  Patient presents with  . Chest Pain     (Consider location/radiation/quality/duration/timing/severity/associated sxs/prior Treatment) Patient is a 68 y.o. female presenting with shortness of breath. The history is provided by the patient. No language interpreter was used.  Shortness of Breath Severity:  Moderate Onset quality:  Gradual Duration:  2 weeks Timing:  Constant Progression:  Worsening Chronicity:  New Context: activity   Relieved by:  Nothing Worsened by:  Nothing tried Ineffective treatments:  None tried Risk factors: obesity   Pt complains of increasing shortness of breath for the past 2 weeks.  Pt is not on home 02.  Pt has a history of a pericardial effusion.    Past Medical History  Diagnosis Date  . Obesity   . Hypertension   . Hyperlipidemia   . Diabetes mellitus     Diet controlled  . Hypothyroidism   . Venous stasis of lower extremity     a. chronic LEE  . Tobacco dependence     a. 46 pack yr hx.  . Chest pain     a. 12/2011 Neg Lexi MV, EF 56%.    . Pericardial effusion     a. Large, s/p pericardiocentesis 01/02/12 yielding 600mL fluid (neg malignant cells, only inflammatory cells)  . DOE (dyspnea on exertion)     Chronic  . Anemia     Noted 12/2011  . Atrial fib/flutter, transient     In setting of pericardial effusion 12/2011  . Diastolic CHF    Past Surgical History  Procedure Laterality Date  . Cholecystectomy  1980  . Appendectomy    . Breast biopsy      a. Benign ~ 20 yr ago.   Family History  Problem Relation Age of Onset  . Cancer Mother     breast cancer (70), colon cancer  . Hypertension Mother   . Arthritis Mother   . Anxiety disorder Mother   . Diabetes Father     died in MVA   History  Substance Use Topics  . Smoking status: Current Every Day Smoker -- 1.00 packs/day for 46 years    Types: Cigarettes  . Smokeless tobacco: Never Used     Comment: 1 ppd since age 68; quit x 7 months with Chantix in past  . Alcohol Use: Yes     Comment: regular   OB History   Grav Para Term Preterm Abortions TAB SAB Ect Mult Living   0 0 0 0 0 0 0 0 0 0      Review of Systems  Respiratory: Positive for shortness of breath.   All other systems reviewed and are negative.      Allergies  Augmentin  Home Medications   Current Outpatient Rx  Name  Route  Sig  Dispense  Refill  . atorvastatin (LIPITOR) 40 MG tablet      TAKE 1 TABLET (40 MG TOTAL) BY MOUTH DAILY.   90 tablet   3   . benazepril (LOTENSIN) 20 MG tablet      TAKE 1 TABLET BY MOUTH EVERY DAY   30 tablet   5   . CARTIA XT 240 MG 24 hr capsule      TAKE ONE CAPSULE BY MOUTH EVERY DAY   30 capsule   5   . Cholecalciferol (VITAMIN D) 1000 UNITS capsule  Oral   Take 2,000 Units by mouth daily.          . furosemide (LASIX) 40 MG tablet      TAKE 1 TABLET BY MOUTH EVERY DAY   30 tablet   5   . KLOR-CON 10 10 MEQ tablet      TAKE 1 TABLET BY MOUTH EVERY DAY   30 tablet   11   . levothyroxine (SYNTHROID, LEVOTHROID) 50 MCG tablet   Oral   Take 1 tablet (50 mcg total) by mouth daily.   90 tablet   3   . metoprolol tartrate (LOPRESSOR) 25 MG tablet   Oral   Take 1 tablet (25 mg total) by mouth 2 (two) times daily.   60 tablet   6   . EXPIRED: nitroGLYCERIN (NITROSTAT) 0.4 MG SL tablet   Sublingual   Place 1 tablet (0.4 mg total) under the tongue every 5 (five) minutes as needed for chest pain (up to 3 doses).   25 tablet   3   . omeprazole (PRILOSEC) 20 MG capsule   Oral   Take 1 capsule (20 mg total) by mouth 2 (two) times daily.          BP 144/64  Pulse 84  Temp(Src) 97.9 F (36.6 C) (Oral)  Resp 24  Ht 5\' 8"  (1.727 m)  Wt 310 lb (140.615 kg)  BMI 47.15 kg/m2  SpO2 97% Physical Exam  Nursing note and vitals reviewed. Constitutional: She is oriented to person, place,  and time. She appears well-developed and well-nourished.  HENT:  Head: Normocephalic.  Eyes: EOM are normal. Pupils are equal, round, and reactive to light.  Neck: Normal range of motion.  Cardiovascular: Normal rate and normal heart sounds.   Pulmonary/Chest: Effort normal and breath sounds normal.  Abdominal: Soft. She exhibits no distension.  Musculoskeletal: Normal range of motion.  Neurological: She is alert and oriented to person, place, and time.  Skin: Skin is warm.  Psychiatric: She has a normal mood and affect.    ED Course  Procedures (including critical care time) Labs Review Labs Reviewed  CBC WITH DIFFERENTIAL - Abnormal; Notable for the following:    HCT 48.1 (*)    RDW 15.6 (*)    All other components within normal limits  BASIC METABOLIC PANEL - Abnormal; Notable for the following:    Glucose, Bld 110 (*)    BUN 37 (*)    Creatinine, Ser 1.50 (*)    GFR calc non Af Amer 35 (*)    GFR calc Af Amer 41 (*)    All other components within normal limits  TROPONIN I   Imaging Review Dg Chest 2 View  09/01/2013   CLINICAL DATA Shortness of breath, wheezing, cough, congestion, chest pain, fatigue, edema, history hypertension, diabetes, CHF, obesity, hyperlipidemia, smoker  EXAM CHEST  2 VIEW  COMPARISON 01/16/2012  FINDINGS Enlargement of cardiac silhouette.  Prominent central pulmonary arteries unchanged.  Mediastinal contours and pulmonary vascularity otherwise normal.  Lungs clear.  No pleural effusion or pneumothorax.  No acute osseous findings.  IMPRESSION Enlargement of cardiac silhouette.  Prominent central pulmonary arteries raising question of pulmonary arterial hypertension.  SIGNATURE  Electronically Signed   By: Ulyses Southward M.D.   On: 09/01/2013 13:53     EKG Interpretation   Date/Time:  Tuesday September 01 2013 12:13:03 EDT Ventricular Rate:  80 PR Interval:  200 QRS Duration: 110 QT Interval:  368 QTC Calculation: 424 R Axis:  123 Text Interpretation:   Normal sinus rhythm Right axis deviation Anterior  infarct , age undetermined Abnormal ECG Confirmed by DELOS  MD, DOUGLAS  ([id number]) on 09/01/2013 2:39:31 PM      MDM Pt 02 stat 85 on arival  Increased to 95% on 02.  Pt feels more comfortable.   Given pt's history of pericardial effusion  I consulted Dr. Milas Kocher  Who advised Ct scan.   He advised Internal medicine can    Final diagnoses:  Community acquired pneumonia    I spoke to Dr. Arbutus Leas [facility] hospitalist who will admit   Elson Areas, PA-C 09/01/13 1648

## 2013-09-01 NOTE — H&P (Signed)
PCP:   Carollee HerterLALONDE,JOHN CHARLES, MD   Chief Complaint:  sob  HPI: 68 yo female h/o diastolic chf, obesity, htn, pericardial effusion s/p pericardiocentesis 2013 comes in with 2 weeks of progressive worsening sob/doe without chest pain with associated over 10 lbs weight gain.  She takes lasix 40mg   Po daily for past 2 years, does not diurese her as well as it did in the past.  No fevers/chills.  Some coughing nonprod.  Has swelling all over, more noticably in her pannus which is new.  She has urinated several times since given 40mg  lasix iv, but still sob even at rest.  [facility]% on RA, normally does not require oxygen.    Review of Systems:  Positive and negative as per HPI otherwise all other systems are negative  Past Medical History: Past Medical History  Diagnosis Date  . Obesity   . Hypertension   . Hyperlipidemia   . Diabetes mellitus     Diet controlled  . Hypothyroidism   . Venous stasis of lower extremity     a. chronic LEE  . Tobacco dependence     a. 46 pack yr hx.  . Chest pain     a. 12/2011 Neg Lexi MV, EF 56%.    . Pericardial effusion     a. Large, s/p pericardiocentesis 01/02/12 yielding 600mL fluid (neg malignant cells, only inflammatory cells)  . DOE (dyspnea on exertion)     Chronic  . Anemia     Noted 12/2011  . Atrial fib/flutter, transient     In setting of pericardial effusion 12/2011  . Diastolic CHF    Past Surgical History  Procedure Laterality Date  . Cholecystectomy  1980  . Appendectomy    . Breast biopsy      a. Benign ~ 20 yr ago.    Medications: Prior to Admission medications   Medication Sig Start Date End Date Taking? Authorizing Provider  atorvastatin (LIPITOR) 40 MG tablet TAKE 1 TABLET (40 MG TOTAL) BY MOUTH DAILY. 03/09/13   Joselyn ArrowEve Knapp, MD  benazepril (LOTENSIN) 20 MG tablet TAKE 1 TABLET BY MOUTH EVERY DAY 06/20/13   Vesta MixerPhilip J Nahser, MD  CARTIA XT 240 MG 24 hr capsule TAKE ONE CAPSULE BY MOUTH EVERY DAY  06/20/13   Vesta MixerPhilip J Nahser, MD  Cholecalciferol (VITAMIN D) 1000 UNITS capsule Take 2,000 Units by mouth daily.     Historical Provider, MD  furosemide (LASIX) 40 MG tablet TAKE 1 TABLET BY MOUTH EVERY DAY 05/06/13   Vesta MixerPhilip J Nahser, MD  KLOR-CON 10 10 MEQ tablet TAKE 1 TABLET BY MOUTH EVERY DAY 05/23/13   Vesta MixerPhilip J Nahser, MD  levothyroxine (SYNTHROID, LEVOTHROID) 50 MCG tablet Take 1 tablet (50 mcg total) by mouth daily. 03/09/13   Joselyn ArrowEve Knapp, MD  metoprolol tartrate (LOPRESSOR) 25 MG tablet Take 1 tablet (25 mg total) by mouth 2 (two) times daily. 10/28/12   Vesta MixerPhilip J Nahser, MD  nitroGLYCERIN (NITROSTAT) 0.4 MG SL tablet Place 1 tablet (0.4 mg total) under the tongue every 5 (five) minutes as needed for chest pain (up to 3 doses). 12/19/11 12/18/12  Jessica A Hope, PA-C  omeprazole (PRILOSEC) 20 MG capsule Take 1 capsule (20 mg total) by mouth 2 (two) times daily. 01/21/12   Rosalio MacadamiaLori C Gerhardt, NP    Allergies:   Allergies  Allergen Reactions  . Augmentin [Amoxicillin-Pot Clavulanate]     Abdominal pain    Social History:  reports that she  has been smoking Cigarettes.  She has a 46 pack-year smoking history. She has never used smokeless tobacco. She reports that she drinks alcohol. She reports that she does not use illicit drugs.  Family History: Family History  Problem Relation Age of Onset  . Cancer Mother     breast cancer (70), colon cancer  . Hypertension Mother   . Arthritis Mother   . Anxiety disorder Mother   . Diabetes Father     died in MVA    Physical Exam: Filed Vitals:   09/01/13 1324 09/01/13 1610 09/01/13 1750 09/01/13 1846  BP:  106/42 100/49 130/58  Pulse:  63 63 70  Temp:  97.6 F (36.4 C) 97.8 F (36.6 C) 97.8 F (36.6 C)  TempSrc:   Oral Oral  Resp:  22 23 24   Height:    5\' 8"  (1.727 m)  Weight:    153.089 kg (337 lb 8 oz)  SpO2: 97% 95% 94% 90%   General appearance: alert, cooperative and no distress Head: Normocephalic, without obvious abnormality,  atraumatic Eyes: negative Nose: Nares normal. Septum midline. Mucosa normal. No drainage or sinus tenderness. Neck: no JVD and supple, symmetrical, trachea midline Lungs: clear to auscultation bilaterally Heart: regular rate and rhythm, S1, S2 normal, no murmur, click, rub or gallop Abdomen: soft, non-tender; bowel sounds normal; no masses,  no organomegaly mild pannus edema Extremities: edema trace Pulses: 2+ and symmetric Skin: Skin color, texture, turgor normal. No rashes or lesions Neurologic: Grossly normal  Labs on Admission:   Recent Labs  09/01/13 1325  NA 143  K 4.5  CL 99  CO2 32  GLUCOSE 110*  BUN 37*  CREATININE 1.50*  CALCIUM 9.3    Recent Labs  09/01/13 1325  WBC 8.8  NEUTROABS 6.4  HGB 14.7  HCT 48.1*  MCV 96.2  PLT 157    Recent Labs  09/01/13 1325  TROPONINI <0.30    Radiological Exms on Admission: Dg Chest 2 View  09/01/2013   CLINICAL DATA Shortness of breath, wheezing, cough, congestion, chest pain, fatigue, edema, history hypertension, diabetes, CHF, obesity, hyperlipidemia, smoker  EXAM CHEST  2 VIEW  COMPARISON 01/16/2012  FINDINGS Enlargement of cardiac silhouette.  Prominent central pulmonary arteries unchanged.  Mediastinal contours and pulmonary vascularity otherwise normal.  Lungs clear.  No pleural effusion or pneumothorax.  No acute osseous findings.  IMPRESSION Enlargement of cardiac silhouette.  Prominent central pulmonary arteries raising question of pulmonary arterial hypertension.  SIGNATURE  Electronically Signed   By: Ulyses Southward M.D.   On: 09/01/2013 13:53   Ct Angio Chest Pe W/cm &/or Wo Cm  09/01/2013   CLINICAL DATA History of CHF, now with acute dyspnea  EXAM CT ANGIOGRAPHY CHEST WITH CONTRAST  TECHNIQUE Multidetector CT imaging of the chest was performed using the standard protocol during bolus administration of intravenous contrast. Multiplanar CT image reconstructions and MIPs were obtained to evaluate the vascular anatomy.   CONTRAST 80mL OMNIPAQUE IOHEXOL 350 MG/ML SOLN  COMPARISON DG CHEST 2 VIEW dated 09/01/2013  FINDINGS Contrast within the pulmonary arterial tree is normal in appearance. There are no filling defects to suggest an acute pulmonary embolism. The caliber of the thoracic aorta is normal. No false aortic lumen is demonstrated. The cardiac chambers are mildly enlarged. There are no pathologic size mediastinal or hilar nor axillary lymph nodes. The thoracic esophagus is normal in caliber. There is no pleural nor pericardial effusion.  At lung window settings there is a patchy area of  increased density in the right upper lobe. The right middle lobe is clear. There is increased interstitial density in the posterior costophrenic gutter on the right. There is patchy density in the left costophrenic gutter that is similar in appearance. The left upper lobe is clear. No pulmonary parenchymal masses or abnormal nodules are demonstrated.  Within the upper abdomen the observed portions of the liver and spleen appear normal. The thoracic vertebral bodies are preserved in height. There is degenerative disc change at multiple levels. The sternum exhibits no acute abnormalities. The observed portions of the ribs exhibit no lytic or blastic lesions.  Review of the MIP images confirms the above findings.  IMPRESSION 1. There is no evidence of an acute pulmonary embolism. No acute thoracic aortic pathology is demonstrated either. 2. There is mild enlargement of the cardiac chambers. The central pulmonary vascularity is prominent but no pruning is demonstrated more peripherally. 3. Patchy densities in the right upper lobe and in both lower lobes suggests subsegmental atelectasis or early pneumonia. 4. There is no mediastinal or hilar lymphadenopathy. There is no pleural nor pericardial effusion.  SIGNATURE  Electronically Signed   By: Emrey Thornley  [place]   On: 09/01/2013 15:13    Assessment/Plan  68 yo female with acute on chronic chf  exacerbation  Principal Problem:   CHF (congestive heart failure)-  Increase lasix to 40mg  iv bid.  chf pathway.  Symptoms are more c/w chf.  In absence of fever or leukocytosis would hold off on treating for pna with abx unless she spikes a fever, develops worsening leukocytosis or clinically does not improve with diuresis.  Cardiology has also been called for consultation.  Her cardiologist is dr Lourena Simmonds.  Active Problems:   Essential hypertension, benign   Chronic diastolic heart failure   Obesity (BMI 30-39.9)   Atrial fibrillation   Acute respiratory failure with hypoxia    Trista Ciocca A 09/01/2013, 7:21 PM

## 2013-09-02 DIAGNOSIS — E039 Hypothyroidism, unspecified: Secondary | ICD-10-CM

## 2013-09-02 DIAGNOSIS — I509 Heart failure, unspecified: Secondary | ICD-10-CM | POA: Diagnosis not present

## 2013-09-02 DIAGNOSIS — J9600 Acute respiratory failure, unspecified whether with hypoxia or hypercapnia: Secondary | ICD-10-CM | POA: Diagnosis not present

## 2013-09-02 DIAGNOSIS — I10 Essential (primary) hypertension: Secondary | ICD-10-CM | POA: Diagnosis not present

## 2013-09-02 DIAGNOSIS — I517 Cardiomegaly: Secondary | ICD-10-CM

## 2013-09-02 DIAGNOSIS — I4891 Unspecified atrial fibrillation: Secondary | ICD-10-CM | POA: Diagnosis not present

## 2013-09-02 DIAGNOSIS — E669 Obesity, unspecified: Secondary | ICD-10-CM

## 2013-09-02 LAB — BASIC METABOLIC PANEL
BUN: 35 mg/dL — ABNORMAL HIGH (ref 6–23)
CHLORIDE: 96 meq/L (ref 96–112)
CO2: 30 mEq/L (ref 19–32)
Calcium: 9 mg/dL (ref 8.4–10.5)
Creatinine, Ser: 1.55 mg/dL — ABNORMAL HIGH (ref 0.50–1.10)
GFR calc non Af Amer: 34 mL/min — ABNORMAL LOW (ref >90)
GFR, EST AFRICAN AMERICAN: 39 mL/min — AB (ref >90)
Glucose, Bld: 99 mg/dL (ref 70–99)
POTASSIUM: 4.8 meq/L (ref 3.7–5.3)
Sodium: 139 mEq/L (ref 137–147)

## 2013-09-02 LAB — TROPONIN I
Troponin I: <0.3 ng/mL (ref <0.30)
Troponin I: <0.3 ng/mL (ref <0.30)

## 2013-09-02 NOTE — Progress Notes (Signed)
UR completed Benaiah Behan K. Tandi Hanko, RN, BSN, MSHL, CCM  09/02/2013 12:53 PM

## 2013-09-02 NOTE — Progress Notes (Signed)
Patient evaluated for community based chronic disease management services with [facility] Care Management Program as a benefit of patient's [employer]. Spoke with patient at bedside to explain [facility] Care Management services.  Services have been declined.  Patient feels that she can self manage at this time.  Left contact information and [facility] literature at bedside. Made Inpatient Case Manager aware that [facility] Care Management following. Of note, [facility] Care Management services does not replace or interfere with any services that are arranged by inpatient case management or social work.  For additional questions or referrals please contact Anibal Henderson BSN RN [facility] Liaison at [phone].

## 2013-09-02 NOTE — Progress Notes (Signed)
[facility] PROGRESS NOTE Interim History: 68 yo female h/o diastolic chf, obesity, htn, pericardial effusion s/p pericardiocentesis 2013 comes in with 2 weeks of progressive worsening sob/doe without chest pain with associated over 10 lbs weight gain. She takes lasix 40mg  Po daily for past 2 years, does not diurese her as well as it did in the past. No fevers/chills. Some coughing nonprod. Has swelling all over, more noticably in her pannus which is new. She has urinated several times since given 40mg  lasix iv, but still sob even at rest. On arrival to [facility] her sats were 85% on RA, normally does not require oxygen.     Filed Weights   09/01/13 1310 09/01/13 1846 09/02/13 0456  Weight: 140.615 kg (310 lb) 153.089 kg (337 lb 8 oz) 152.182 kg (335 lb 8 oz)        Intake/Output Summary (Last 24 hours) at 09/02/13 1647 Last data filed at 09/02/13 1430  Gross per 24 hour  Intake    963 ml  Output   2750 ml  Net  -1787 ml     Assessment/Plan: Acute on chronic diastolic CHF (congestive heart failure) exacerbation: -continue daily weight, strict I's and O's and apply TED hose -continue IV lasix -good diuresis obtained with 40mg  BID -2-D echo demonstrating preserved EF and no wall motion abnormalities; grade 2 diastolic dysfunction -continue low sodium diet -troponin neg -continue B-blockers  Acute respiratory failure with hypoxia -due to above -improved and currently with good O2 sat on rA -continue treatment for CHF  Atrial fibrillation -rate controlled -continue lopressor and cardizem  Obesity (BMI 30-39.9) -low calorie diet and exercise has been dicussed with patient  Essential hypertension, benign -stable, will continue current medication regimen  HLD -continue statins  hypothyroidism -continue synthroid  GERD -continue PPI  DVT: lovenox   Code Status: Full Family Communication: no family at bedside Disposition Plan: home when medically stable     Consultants:  None   Procedures: ECHO: 3/11 - Left ventricle: The cavity size was normal. Systolic function was normal. The estimated ejection fraction was in the range of 55% to 65%. Features are consistent with a pseudonormal left ventricular filling pattern, with concomitant abnormal relaxation and increased filling pressure (grade 2 diastolic dysfunction). - Mitral valve: Calcified annulus. - Left atrium: The atrium was moderately dilated. - Right ventricle: The cavity size was moderately to severely dilated. Systolic function was mildly reduced. - Right atrium: The atrium was severely dilated.  Antibiotics:  None   HPI/Subjective: Feeling better; and breathing somewhat better. No CP, no fever.  Objective: Filed Vitals:   09/02/13 0149 09/02/13 0456 09/02/13 1019 09/02/13 1430  BP: 125/86 113/53 135/57 127/50  Pulse: 59 63 63 65  Temp: 97.8 F (36.6 C) 97.6 F (36.4 C)  97.6 F (36.4 C)  TempSrc: Oral Oral  Oral  Resp: 22 22 20 20   Height:      Weight:  152.182 kg (335 lb 8 oz)    SpO2: 96% 91% 95% 95%     Exam:  General: Alert, awake, oriented x3, in no acute distress.  HEENT: No bruits, no goiter. No JVD Heart: Regular rate and rhythm, no rubs or gallops' 2-3++ edema bilaterally Lungs: decrease air movement, positive bibasilar crackles Abdomen: Soft, nontender, nondistended, positive bowel sounds.  Neuro: Grossly intact, nonfocal.   Data Reviewed: Basic Metabolic Panel:  Recent Labs Lab 09/01/13 1325 09/01/13 2227 09/02/13 0145  NA 143  --  139  K 4.5  --  4.8  CL 99  --  96  CO2 32  --  30  GLUCOSE 110*  --  99  BUN 37*  --  35*  CREATININE 1.50* 1.62* 1.55*  CALCIUM 9.3  --  9.0   CBC:  Recent Labs Lab 09/01/13 1325 09/01/13 2227  WBC 8.8 9.6  NEUTROABS 6.4  --   HGB 14.7 14.4  HCT 48.1* 45.7  MCV 96.2 95.4  PLT 157 147*   Cardiac Enzymes:  Recent Labs Lab 09/01/13 1325 09/01/13 2227 09/02/13 0145 09/02/13 0805   TROPONINI <0.30 <0.30 <0.30 <0.30   BNP (last 3 results)  Recent Labs  09/01/13 1402  PROBNP 7171.0*    Studies: Dg Chest 2 View  09/01/2013   CLINICAL DATA Shortness of breath, wheezing, cough, congestion, chest pain, fatigue, edema, history hypertension, diabetes, CHF, obesity, hyperlipidemia, smoker  EXAM CHEST  2 VIEW  COMPARISON 01/16/2012  FINDINGS Enlargement of cardiac silhouette.  Prominent central pulmonary arteries unchanged.  Mediastinal contours and pulmonary vascularity otherwise normal.  Lungs clear.  No pleural effusion or pneumothorax.  No acute osseous findings.  IMPRESSION Enlargement of cardiac silhouette.  Prominent central pulmonary arteries raising question of pulmonary arterial hypertension.  SIGNATURE  Electronically Signed   By: Ulyses SouthwardMark  Boles M.D.   On: 09/01/2013 13:53   Ct Angio Chest Pe W/cm &/or Wo Cm  09/01/2013   CLINICAL DATA History of CHF, now with acute dyspnea  EXAM CT ANGIOGRAPHY CHEST WITH CONTRAST  TECHNIQUE Multidetector CT imaging of the chest was performed using the standard protocol during bolus administration of intravenous contrast. Multiplanar CT image reconstructions and MIPs were obtained to evaluate the vascular anatomy.  CONTRAST 80mL OMNIPAQUE IOHEXOL 350 MG/ML SOLN  COMPARISON DG CHEST 2 VIEW dated 09/01/2013  FINDINGS Contrast within the pulmonary arterial tree is normal in appearance. There are no filling defects to suggest an acute pulmonary embolism. The caliber of the thoracic aorta is normal. No false aortic lumen is demonstrated. The cardiac chambers are mildly enlarged. There are no pathologic size mediastinal or hilar nor axillary lymph nodes. The thoracic esophagus is normal in caliber. There is no pleural nor pericardial effusion.  At lung window settings there is a patchy area of increased density in the right upper lobe. The right middle lobe is clear. There is increased interstitial density in the posterior costophrenic gutter on the  right. There is patchy density in the left costophrenic gutter that is similar in appearance. The left upper lobe is clear. No pulmonary parenchymal masses or abnormal nodules are demonstrated.  Within the upper abdomen the observed portions of the liver and spleen appear normal. The thoracic vertebral bodies are preserved in height. There is degenerative disc change at multiple levels. The sternum exhibits no acute abnormalities. The observed portions of the ribs exhibit no lytic or blastic lesions.  Review of the MIP images confirms the above findings.  IMPRESSION 1. There is no evidence of an acute pulmonary embolism. No acute thoracic aortic pathology is demonstrated either. 2. There is mild enlargement of the cardiac chambers. The central pulmonary vascularity is prominent but no pruning is demonstrated more peripherally. 3. Patchy densities in the right upper lobe and in both lower lobes suggests subsegmental atelectasis or early pneumonia. 4. There is no mediastinal or hilar lymphadenopathy. There is no pleural nor pericardial effusion.  SIGNATURE  Electronically Signed   By: David  SwazilandJordan   On: 09/01/2013 15:13    Scheduled Meds: . aspirin EC  81  mg Oral Daily  . atorvastatin  40 mg Oral Daily  . benazepril  20 mg Oral Daily  . diltiazem  240 mg Oral Daily  . enoxaparin (LOVENOX) injection  40 mg Subcutaneous Q24H  . furosemide  40 mg Intravenous Q12H  . levothyroxine  50 mcg Oral Q breakfast  . metoprolol tartrate  25 mg Oral BID  . pantoprazole  40 mg Oral Daily  . potassium chloride  10 mEq Oral Daily  . sodium chloride  3 mL Intravenous Q12H   Continuous Infusions:   Time > 30 minutes  Armen Pickup  [facility] Pager [phone]. If 8PM-8AM, please contact night-coverage at [email or web address], password [facility] 09/02/2013, 4:47 PM  LOS: 1 day

## 2013-09-02 NOTE — Progress Notes (Signed)
  Echocardiogram 2D Echocardiogram has been performed.  Nancy West 09/02/2013, 10:06 AM

## 2013-09-02 NOTE — Care Management Note (Addendum)
  Page 1 of 1   09/07/2013     12:31:30 PM   CARE MANAGEMENT NOTE 09/07/2013  Patient:  Nancy West, Nancy West   Account Number:  [id number]  Date Initiated:  09/02/2013  Documentation initiated by:  Maxi Carreras  Subjective/Objective Assessment:   Admitted with shortness of breath     Action/Plan:   CM to follow for dispositon needs   Anticipated DC Date:  09/02/2013   Anticipated DC Plan:  HOME/SELF CARE         Choice offered to / List presented to:             Status of service:  Completed, signed off Medicare Important Message given?   (If response is "NO", the following Medicare IM given date fields will be blank) Date Medicare IM given:   Date Additional Medicare IM given:    Discharge Disposition:  HOME/SELF CARE  Per UR Regulation:  Reviewed for med. necessity/level of care/duration of stay  If discussed at Long Length of Stay Meetings, dates discussed:    Comments:  Dispo:  d/c to home 09/05/2013 Home/self care. Haillie Radu RN, BSN, M[facility], CCM 09/07/2013  09/02/2013 H/o diastolic chf, obesity, htn, pericardial effusion s/p pericardiocentesis 2013 presented with 2 weeks of progressive worsening sob without chest pain and wt gain over 10 lbs. Wt 335/ 337 Echo 09/02/13 Social:  From Home w/husband Disposition Plan:  Pending Javiel Canepa RN, BSN, [place], CCM 09/02/2013

## 2013-09-03 DIAGNOSIS — I509 Heart failure, unspecified: Secondary | ICD-10-CM | POA: Diagnosis not present

## 2013-09-03 DIAGNOSIS — I4891 Unspecified atrial fibrillation: Secondary | ICD-10-CM | POA: Diagnosis not present

## 2013-09-03 DIAGNOSIS — I5032 Chronic diastolic (congestive) heart failure: Secondary | ICD-10-CM | POA: Diagnosis not present

## 2013-09-03 DIAGNOSIS — J9600 Acute respiratory failure, unspecified whether with hypoxia or hypercapnia: Secondary | ICD-10-CM | POA: Diagnosis not present

## 2013-09-03 LAB — BASIC METABOLIC PANEL
BUN: 30 mg/dL — ABNORMAL HIGH (ref 6–23)
CALCIUM: 9.2 mg/dL (ref 8.4–10.5)
CO2: 32 mEq/L (ref 19–32)
Chloride: 95 mEq/L — ABNORMAL LOW (ref 96–112)
Creatinine, Ser: 1.36 mg/dL — ABNORMAL HIGH (ref 0.50–1.10)
GFR, EST AFRICAN AMERICAN: 46 mL/min — AB (ref >90)
GFR, EST NON AFRICAN AMERICAN: 40 mL/min — AB (ref >90)
Glucose, Bld: 96 mg/dL (ref 70–99)
Potassium: 4.6 mEq/L (ref 3.7–5.3)
Sodium: 140 mEq/L (ref 137–147)

## 2013-09-03 NOTE — Progress Notes (Signed)
[facility] PROGRESS NOTE Interim History: 68 yo female h/o diastolic chf, obesity, htn, pericardial effusion s/p pericardiocentesis 2013 comes in with 2 weeks of progressive worsening sob/doe without chest pain with associated over 10 lbs weight gain. She takes lasix 40mg  Po daily for past 2 years, does not diurese her as well as it did in the past. No fevers/chills. Some coughing nonprod. Has swelling all over, more noticably in her pannus which is new. She has urinated several times since given 40mg  lasix iv, but still sob even at rest. On arrival to [facility] her sats were 85% on RA, normally does not require oxygen.     Filed Weights   09/01/13 1846 09/02/13 0456 09/03/13 0715  Weight: 153.089 kg (337 lb 8 oz) 152.182 kg (335 lb 8 oz) 151.456 kg (333 lb 14.4 oz)        Intake/Output Summary (Last 24 hours) at 09/03/13 1611 Last data filed at 09/03/13 1450  Gross per 24 hour  Intake    960 ml  Output   4300 ml  Net  -3340 ml     Assessment/Plan: Acute on chronic diastolic CHF (congestive heart failure) exacerbation: -continue daily weight, strict I's and O's and TED hose -continue IV lasix (still with signs of fluid overload) -good diuresis  -2-D echo demonstrating preserved EF and no wall motion abnormalities; positive grade 2 diastolic dysfunction -continue low sodium diet -troponin neg -continue B-blockers  Acute respiratory failure with hypoxia -due to above -improved and currently with good O2 sat on rA -continue treatment for CHF  Atrial fibrillation -rate controlled -continue lopressor and cardizem  Obesity (BMI 30-39.9) -low calorie diet and exercise has been dicussed with patient  Essential hypertension, benign -stable, will continue current medication regimen  HLD -continue statins  hypothyroidism -continue synthroid  GERD -continue PPI  DVT: lovenox   Code Status: Full Family Communication: no family at bedside Disposition Plan: home  when medically stable    Consultants:  None   Procedures: ECHO: 3/11 - Left ventricle: The cavity size was normal. Systolic function was normal. The estimated ejection fraction was in the range of 55% to 65%. Features are consistent with a pseudonormal left ventricular filling pattern, with concomitant abnormal relaxation and increased filling pressure (grade 2 diastolic dysfunction). - Mitral valve: Calcified annulus. - Left atrium: The atrium was moderately dilated. - Right ventricle: The cavity size was moderately to severely dilated. Systolic function was mildly reduced. - Right atrium: The atrium was severely dilated.  Antibiotics:  None   HPI/Subjective: No CP, reports improvement in her SOB; LE edema still present 2-3++ (but better)  Objective: Filed Vitals:   09/02/13 2004 09/03/13 0715 09/03/13 1046 09/03/13 1451  BP: 142/44 125/53 114/82 100/55  Pulse: 65 68 63 62  Temp: 97.2 F (36.2 C) 97.3 F (36.3 C)  97.6 F (36.4 C)  TempSrc: Oral Oral  Oral  Resp: 20 20  20   Height:      Weight:  151.456 kg (333 lb 14.4 oz)    SpO2: 92% 90% 97% 98%     Exam:  General: Alert, awake, oriented x3, in no acute distress.  HEENT: No bruits, no goiter. No JVD Heart: Regular rate and rhythm, no rubs or gallops' 2-3++ edema bilaterally Lungs: decrease air movement, positive bibasilar crackles Abdomen: Soft, nontender, nondistended, positive bowel sounds.  Neuro: Grossly intact, nonfocal.   Data Reviewed: Basic Metabolic Panel:  Recent Labs Lab 09/01/13 1325 09/01/13 2227 09/02/13 0145 09/03/13 0615  NA  143  --  139 140  K 4.5  --  4.8 4.6  CL 99  --  96 95*  CO2 32  --  30 32  GLUCOSE 110*  --  99 96  BUN 37*  --  35* 30*  CREATININE 1.50* 1.62* 1.55* 1.36*  CALCIUM 9.3  --  9.0 9.2   CBC:  Recent Labs Lab 09/01/13 1325 09/01/13 2227  WBC 8.8 9.6  NEUTROABS 6.4  --   HGB 14.7 14.4  HCT 48.1* 45.7  MCV 96.2 95.4  PLT 157 147*   Cardiac  Enzymes:  Recent Labs Lab 09/01/13 1325 09/01/13 2227 09/02/13 0145 09/02/13 0805  TROPONINI <0.30 <0.30 <0.30 <0.30   BNP (last 3 results)  Recent Labs  09/01/13 1402  PROBNP 7171.0*    Studies: No results found.  Scheduled Meds: . aspirin EC  81 mg Oral Daily  . atorvastatin  40 mg Oral Daily  . benazepril  20 mg Oral Daily  . diltiazem  240 mg Oral Daily  . enoxaparin (LOVENOX) injection  40 mg Subcutaneous Q24H  . furosemide  40 mg Intravenous Q12H  . levothyroxine  50 mcg Oral Q breakfast  . metoprolol tartrate  25 mg Oral BID  . pantoprazole  40 mg Oral Daily  . potassium chloride  10 mEq Oral Daily  . sodium chloride  3 mL Intravenous Q12H   Continuous Infusions:   Time > 30 minutes  Armen PickupManzueta, Gwendlyn Hanback E Akaisha Truman  [facility] Pager [phone]. If 8PM-8AM, please contact night-coverage at [email or web address], password [email or web address] 09/03/2013, 4:11 PM  LOS: 2 days

## 2013-09-03 NOTE — ED Provider Notes (Signed)
Medical screening examination/treatment/ procedure(s) were conducted as a shared visit with non-physician practitioner(s) and myself.  I personally evaluated the patient during the encounter.  Patient is a 68 year old female who presents with complaints of shortness of breath for the past several weeks that has been worsening.  She has a history of pericardial effusion which was treated with pericardiocentesis approximately 2 years ago. The exact cause of this had never been determined and she has been doing fine since that time.  On exam, vitals are stable and the patient is afebrile. She is hypoxic with oxygen saturations of 85% on room air. Head is atraumatic normocephalic. Neck is supple. Heart is regular rate and rhythm. There is no rub. Lungs are clear and equal.  Workup reveals an enlarged cardiac silhouette on the chest x-ray concerning for recurrent pericardial effusion. Remainder the workup is otherwise unremarkable. The case was discussed with cardiology who recommended a CT scan to determine whether the effusion had recurred. This was performed and reveals an enlarged cardiac silhouette and findings consistent with pulmonary hypertension.  She will be admitted to the medicine service.     EKG Interpretation   Date/Time:  Tuesday September 01 2013 12:13:03 EDT Ventricular Rate:  80 PR Interval:  200 QRS Duration: 110 QT Interval:  368 QTC Calculation: 424 R Axis:   123 Text Interpretation:  Normal sinus rhythm Right axis deviation Anterior  infarct , age undetermined Abnormal ECG Confirmed by Malva Cogan  MD, Vashawn Ekstein  [phone]) on 09/01/2013 2:39:31 PM       Geoffery Lyons, MD 09/03/13 1635

## 2013-09-04 DIAGNOSIS — I10 Essential (primary) hypertension: Secondary | ICD-10-CM | POA: Diagnosis not present

## 2013-09-04 DIAGNOSIS — J9600 Acute respiratory failure, unspecified whether with hypoxia or hypercapnia: Secondary | ICD-10-CM | POA: Diagnosis not present

## 2013-09-04 DIAGNOSIS — I4891 Unspecified atrial fibrillation: Secondary | ICD-10-CM | POA: Diagnosis not present

## 2013-09-04 DIAGNOSIS — I509 Heart failure, unspecified: Secondary | ICD-10-CM | POA: Diagnosis not present

## 2013-09-04 DIAGNOSIS — F17200 Nicotine dependence, unspecified, uncomplicated: Secondary | ICD-10-CM

## 2013-09-04 LAB — BASIC METABOLIC PANEL
BUN: 29 mg/dL — ABNORMAL HIGH (ref 6–23)
CALCIUM: 9.4 mg/dL (ref 8.4–10.5)
CO2: 36 mEq/L — ABNORMAL HIGH (ref 19–32)
Chloride: 94 mEq/L — ABNORMAL LOW (ref 96–112)
Creatinine, Ser: 1.42 mg/dL — ABNORMAL HIGH (ref 0.50–1.10)
GFR calc Af Amer: 44 mL/min — ABNORMAL LOW (ref >90)
GFR, EST NON AFRICAN AMERICAN: 38 mL/min — AB (ref >90)
Glucose, Bld: 89 mg/dL (ref 70–99)
Potassium: 4.6 mEq/L (ref 3.7–5.3)
SODIUM: 142 meq/L (ref 137–147)

## 2013-09-04 MED ORDER — MAGNESIUM SULFATE 40 MG/ML IJ SOLN
2.0000 g | Freq: Once | INTRAMUSCULAR | Status: AC
  Administered 2013-09-04: 2 g via INTRAVENOUS
  Filled 2013-09-04: qty 50

## 2013-09-04 NOTE — Progress Notes (Signed)
Pt a/o, no c/o pain, pt oob ad lib, pt stable  

## 2013-09-04 NOTE — Plan of Care (Signed)
Problem: Phase I Progression Outcomes Goal: EF % per last Echo/documented,Core Reminder form on chart Outcome: Completed/Met Date Met:  09/04/13 EF 55-65%

## 2013-09-04 NOTE — Progress Notes (Signed)
[facility] PROGRESS NOTE Interim History: 68 yo female h/o diastolic chf, obesity, htn, pericardial effusion s/p pericardiocentesis 2013 comes in with 2 weeks of progressive worsening sob/doe without chest pain with associated over 10 lbs weight gain. She takes lasix 40mg  Po daily for past 2 years, does not diurese her as well as it did in the past. No fevers/chills. Some coughing nonprod. Has swelling all over, more noticably in her pannus which is new. She has urinated several times since given 40mg  lasix iv, but still sob even at rest. [facility] 85% on RA, normally does not require oxygen.     Filed Weights   09/02/13 0456 09/03/13 0715 09/04/13 0520  Weight: 152.182 kg (335 lb 8 oz) 151.456 kg (333 lb 14.4 oz) 149.778 kg (330 lb 3.2 oz)        Intake/Output Summary (Last 24 hours) at 09/04/13 1716 Last data filed at 09/04/13 1647  Gross per 24 hour  Intake    960 ml  Output   4700 ml  Net  -3740 ml     Assessment/Plan: Acute on chronic diastolic CHF (congestive heart failure) exacerbation: -continue daily weight, strict I's and O's and TED hose -continue IV lasix (still with signs of fluid overload); will treat for 1 more day and then transition to PO at higher dose at discharge -good diuresis continue -2-D echo demonstrating preserved EF and no wall motion abnormalities; positive grade 2 diastolic dysfunction -continue low sodium diet -troponin neg -continue B-blockers  Acute respiratory failure with hypoxia -due to above -improved and currently with good O2 sat on rA -continue treatment for CHF  Atrial fibrillation -rate controlled -continue lopressor and cardizem  Obesity (BMI 30-39.9) -low calorie diet and exercise has been dicussed with patient  Essential hypertension, benign -stable, will continue current medication regimen  HLD -continue statins  hypothyroidism -continue synthroid  GERD -continue PPI  DVT: lovenox    Code Status: Full Family Communication: no family at bedside Disposition Plan: home when medically stable    Consultants:  None   Procedures: ECHO: 3/11 - Left ventricle: The cavity size was normal. Systolic function was normal. The estimated ejection fraction was in the range of 55% to 65%. Features are consistent with a pseudonormal left ventricular filling pattern, with concomitant abnormal relaxation and increased filling pressure (grade 2 diastolic dysfunction). - Mitral valve: Calcified annulus. - Left atrium: The atrium was moderately dilated. - Right ventricle: The cavity size was moderately to severely dilated. Systolic function was mildly reduced. - Right atrium: The atrium was severely dilated.  Antibiotics:  None   HPI/Subjective: No CP, reports breathing significant improved. LE edema also improving 1-2 ++ edema  Objective: Filed Vitals:   09/03/13 2114 09/04/13 0520 09/04/13 0957 09/04/13 1515  BP: 115/51 122/48 125/48 116/51  Pulse: 62 71 68 60  Temp: 98.3 F (36.8 C) 97.8 F (36.6 C)  97.8 F (36.6 C)  TempSrc: Oral Oral  Oral  Resp: 19 18  18   Height:      Weight:  149.778 kg (330 lb 3.2 oz)    SpO2: 93% 91%  91%     Exam: General: Alert, awake, oriented x3, in no acute distress.  HEENT: No bruits, no goiter. No JVD Heart: Regular rate and rhythm, no rubs or gallops; 1-2++ edema bilaterally Lungs: decrease air movement, fine bibasilar crackles Abdomen: Soft, nontender, nondistended, positive bowel sounds.  Neuro: Grossly intact, nonfocal.   Data Reviewed: Basic Metabolic Panel:  Recent Labs Lab 09/01/13 1325 09/01/13 2227 09/02/13 0145 09/03/13 0615 09/04/13 0533  NA 143  --  139 140 142  K 4.5  --  4.8 4.6 4.6  CL 99  --  96 95* 94*  CO2 32  --  30 32 36*  GLUCOSE 110*  --  99 96 89  BUN 37*  --  35* 30* 29*  CREATININE 1.50* 1.62* 1.55* 1.36* 1.42*  CALCIUM 9.3  --  9.0 9.2 9.4   CBC:  Recent Labs Lab 09/01/13 1325  09/01/13 2227  WBC 8.8 9.6  NEUTROABS 6.4  --   HGB 14.7 14.4  HCT 48.1* 45.7  MCV 96.2 95.4  PLT 157 147*   Cardiac Enzymes:  Recent Labs Lab 09/01/13 1325 09/01/13 2227 09/02/13 0145 09/02/13 0805  TROPONINI <0.30 <0.30 <0.30 <0.30   BNP (last 3 results)  Recent Labs  09/01/13 1402  PROBNP 7171.0*    Studies: No results found.  Scheduled Meds: . aspirin EC  81 mg Oral Daily  . atorvastatin  40 mg Oral Daily  . benazepril  20 mg Oral Daily  . diltiazem  240 mg Oral Daily  . enoxaparin (LOVENOX) injection  40 mg Subcutaneous Q24H  . furosemide  40 mg Intravenous Q12H  . levothyroxine  50 mcg Oral Q breakfast  . magnesium sulfate 1 - 4 g bolus IVPB  2 g Intravenous Once  . metoprolol tartrate  25 mg Oral BID  . pantoprazole  40 mg Oral Daily  . potassium chloride  10 mEq Oral Daily  . sodium chloride  3 mL Intravenous Q12H   Continuous Infusions:   Time > 30 minutes  Armen Pickup  [facility] Pager [phone]. If 8PM-8AM, please contact night-coverage at [email or web address], password [facility] 09/04/2013, 5:16 PM  LOS: 3 days

## 2013-09-05 DIAGNOSIS — I10 Essential (primary) hypertension: Secondary | ICD-10-CM | POA: Diagnosis not present

## 2013-09-05 DIAGNOSIS — I5033 Acute on chronic diastolic (congestive) heart failure: Secondary | ICD-10-CM | POA: Diagnosis not present

## 2013-09-05 DIAGNOSIS — I4891 Unspecified atrial fibrillation: Secondary | ICD-10-CM | POA: Diagnosis not present

## 2013-09-05 DIAGNOSIS — J9600 Acute respiratory failure, unspecified whether with hypoxia or hypercapnia: Secondary | ICD-10-CM | POA: Diagnosis not present

## 2013-09-05 LAB — BASIC METABOLIC PANEL
BUN: 31 mg/dL — ABNORMAL HIGH (ref 6–23)
CALCIUM: 9.1 mg/dL (ref 8.4–10.5)
CO2: 29 mEq/L (ref 19–32)
Chloride: 96 mEq/L (ref 96–112)
Creatinine, Ser: 1.41 mg/dL — ABNORMAL HIGH (ref 0.50–1.10)
GFR calc Af Amer: 44 mL/min — ABNORMAL LOW (ref >90)
GFR, EST NON AFRICAN AMERICAN: 38 mL/min — AB (ref >90)
GLUCOSE: 84 mg/dL (ref 70–99)
Potassium: 4.4 mEq/L (ref 3.7–5.3)
SODIUM: 140 meq/L (ref 137–147)

## 2013-09-05 LAB — CBC
HCT: 42.8 % (ref 36.0–46.0)
Hemoglobin: 13.8 g/dL (ref 12.0–15.0)
MCH: 29.6 pg (ref 26.0–34.0)
MCHC: 32.2 g/dL (ref 30.0–36.0)
MCV: 91.6 fL (ref 78.0–100.0)
PLATELETS: 142 10*3/uL — AB (ref 150–400)
RBC: 4.67 MIL/uL (ref 3.87–5.11)
RDW: 14.9 % (ref 11.5–15.5)
WBC: 7.2 10*3/uL (ref 4.0–10.5)

## 2013-09-05 LAB — PRO B NATRIURETIC PEPTIDE: Pro B Natriuretic peptide (BNP): 1757 pg/mL — ABNORMAL HIGH (ref 0–125)

## 2013-09-05 MED ORDER — FUROSEMIDE 40 MG PO TABS: 40.0000 mg | ORAL_TABLET | Freq: Two times a day (BID) | ORAL | Status: DC

## 2013-09-05 MED ORDER — ASPIRIN 81 MG PO TBEC
81.0000 mg | DELAYED_RELEASE_TABLET | Freq: Every day | ORAL | Status: DC
Start: 2013-09-05 — End: 2016-07-27

## 2013-09-05 NOTE — Discharge Summary (Signed)
Physician Discharge Summary  Nancy West ZOX:[id number] DOB: [birth year] DOA: 09/01/2013  PCP: Carollee Herter, MD  Admit date: 09/01/2013 Discharge date: 09/05/2013  Time spent: >30 minutes  Recommendations for Outpatient Follow-up:  1. High risk for OSA/COPd; needs follow up with pulmonology for PFT's and sleep study 2. BMET to follow electrolytes and renal function 3. Reassess BP and adjust medications as needed 4. Follow/assist with tobacco cessation process 5. 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 Will discharge home with 1 week follow up with cardiology and a follow up in 10 days with PCP.  Diet recommendation: Low sodium, low carbohydrates, low calorie diet.  History of present illness:  68 yo female h/o diastolic chf, obesity, htn, pericardial effusion s/p pericardiocentesis 2013 comes in with 2 weeks of progressive worsening sob/doe without chest pain with associated over 10 lbs weight gain. She takes lasix 40mg  Po daily for past 2 years, does not diurese her as well as it did in the past. No fevers/chills. Some coughing nonprod. Has swelling all over, more noticably in her pannus which is new. She has urinated several times since given 40mg  lasix iv, but still sob even at rest. On arrival to [facility] her sats were 85% on RA, normally does not require  oxygen.    Hospital Course:  Acute on chronic diastolic CHF (congestive heart failure) exacerbation:  -continue daily weight and low sodium diet -Will discharged on Lasix 40 mg by mouth twice a day -Patient continued to have good diuresis -2-D echo demonstrating preserved EF and no wall motion abnormalities; positive grade 2 diastolic dysfunction  -troponin neg  -continue B-blockers   Acute respiratory failure with hypoxia  -due to above  -improved and currently with good O2 sat on rA  -continue treatment for CHF   Hx of Atrial fibrillation  -rate controlled  -continue lopressor and cardizem  -Patient on aspirin at this point. -Has remained sinus rhythm with first degree AV block  Obesity (BMI 30-39.9)  -low calorie diet and exercise has been dicussed with patient   Essential hypertension, benign  -stable, will continue current medication regimen  -patient advise to follow low sodium diet  HLD  -continue statins   hypothyroidism  -continue synthroid   GERD  -continue PPI  Diabetes mellitus type 2 -Patient currently following diet control -Last A1c 6.0 -Will follow with PCP as an outpatient for further decision regarding when to start hypoglycemic regimen. -Patient advised to continue low carbohydrate diet he did  Acute on chronic kidney disease: Secondary to cardiorenal syndrome with CHF exacerbation and subsequent IV diuresis. -Medicine GFR at discharge kidney function back to baseline -Patient will require close followup with basic metabolic panel during her next visit -No electrolytes abnormalities  Procedures: 2-D echo : 3/10 - Left ventricle: The cavity size was normal.  Systolic function was normal. The estimated ejection fraction was in the range of 55% to 65%. Features are consistent with a pseudonormal left ventricular filling pattern, with concomitant abnormal relaxation and increased filling pressure (grade 2 diastolic dysfunction). - Mitral valve:  Calcified annulus. - Left atrium: The atrium was moderately dilated. - Right ventricle: The cavity size was moderately to severely dilated. Systolic function was mildly reduced. - Right atrium: The atrium was severely dilated.   Consultations:  None   Discharge Exam: Filed Vitals:   09/05/13 1415  BP: 152/72  Pulse: 74  Temp: 97.9 F (36.6 C)  Resp: 20   General: Alert, awake, oriented x3, in no acute distress.  HEENT: No bruits, no goiter. No JVD  Heart: Regular rate and rhythm, no rubs or gallops; 1-2++ edema bilaterally  Lungs: decrease air movement, fine bibasilar crackles  Abdomen: Soft, nontender, nondistended, positive bowel sounds.  Neuro: Grossly intact, nonfocal.      Discharge Instructions  Discharge Orders   Future Appointments Provider Department Dept Phone   09/14/2013 1:30 PM Joselyn ArrowEve Knapp, MD [facility] [phone]   Future Orders Complete By Expires   Diet - low sodium heart healthy  As directed    Discharge instructions  As directed    Comments:     Stop smoking Take medications as prescribed Follow with cardiology in 1 week Follow with PCP in 10 days Follow a low sodium diet (less than 2 grams daily) Daily weights       Medication List         aspirin 81 MG EC tablet  Take 1 tablet (81 mg total) by mouth daily.     atorvastatin 40 MG tablet  Commonly known as:  LIPITOR  Take 40 mg by mouth daily.     benazepril 20 MG tablet  Commonly known as:  LOTENSIN  Take 20 mg by mouth daily.     diltiazem 240 MG 24 hr capsule  Commonly known as:  CARDIZEM CD  Take 240 mg by mouth daily.     furosemide 40 MG tablet  Commonly known as:  LASIX  Take 1 tablet (40 mg total) by mouth 2 (two) times daily.     levothyroxine 50 MCG tablet  Commonly known as:  SYNTHROID, LEVOTHROID  Take 1 tablet (50 mcg total) by mouth daily.     metoprolol tartrate 25 MG tablet  Commonly known as:  LOPRESSOR  Take 1 tablet (25 mg total) by mouth 2  (two) times daily.     nitroGLYCERIN 0.4 MG SL tablet  Commonly known as:  NITROSTAT  Place 1 tablet (0.4 mg total) under the tongue every 5 (five) minutes as needed for chest pain (up to 3 doses).     omeprazole 20 MG capsule  Commonly known as:  PRILOSEC  Take 1 capsule (20 mg total) by mouth 2 (two) times daily.     potassium chloride 10 MEQ tablet  Commonly known as:  K-DUR,KLOR-CON  Take 10 mEq by mouth daily.     Vitamin D 1000 UNITS capsule  Take 1,000 Units by mouth daily.       Allergies  Allergen Reactions  . Augmentin [Amoxicillin-Pot Clavulanate]     Abdominal pain       Follow-up Information   Follow up with Carollee HerterLALONDE,JOHN CHARLES, MD. Schedule an appointment as soon as possible for a visit in 10 days.   Specialty:  Family Medicine   Contact information:   [address] [place] [address] [phone]       Call Elyn Aquas., MD. (office to set up follow up appointment in 1 week)    Specialty:  Cardiology   Contact information:   [address] [place] [address] [phone]       The results of significant diagnostics from this hospitalization (including imaging, microbiology, ancillary and laboratory) are listed below for reference.    Significant Diagnostic Studies: Dg Chest 2 View  09/01/2013   CLINICAL DATA Shortness of breath, wheezing, cough, congestion, chest pain, fatigue, edema, history hypertension, diabetes, CHF, obesity, hyperlipidemia, smoker  EXAM CHEST  2 VIEW  COMPARISON 01/16/2012  FINDINGS Enlargement of cardiac silhouette.  Prominent central pulmonary arteries unchanged.  Mediastinal contours and pulmonary vascularity otherwise normal.  Lungs clear.  No pleural effusion or pneumothorax.  No acute osseous findings.  IMPRESSION Enlargement of cardiac silhouette.  Prominent central pulmonary arteries raising question of pulmonary arterial hypertension.  SIGNATURE  Electronically Signed   By: Ulyses Southward M.D.    On: 09/01/2013 13:53   Ct Angio Chest Pe W/cm &/or Wo Cm  09/01/2013   CLINICAL DATA History of CHF, now with acute dyspnea  EXAM CT ANGIOGRAPHY CHEST WITH CONTRAST  TECHNIQUE Multidetector CT imaging of the chest was performed using the standard protocol during bolus administration of intravenous contrast. Multiplanar CT image reconstructions and MIPs were obtained to evaluate the vascular anatomy.  CONTRAST 60mL OMNIPAQUE IOHEXOL 350 MG/ML SOLN  COMPARISON DG CHEST 2 VIEW dated 09/01/2013  FINDINGS Contrast within the pulmonary arterial tree is normal in appearance. There are no filling defects to suggest an acute pulmonary embolism. The caliber of the thoracic aorta is normal. No false aortic lumen is demonstrated. The cardiac chambers are mildly enlarged. There are no pathologic size mediastinal or hilar nor axillary lymph nodes. The thoracic esophagus is normal in caliber. There is no pleural nor pericardial effusion.  At lung window settings there is a patchy area of increased density in the right upper lobe. The right middle lobe is clear. There is increased interstitial density in the posterior costophrenic gutter on the right. There is patchy density in the left costophrenic gutter that is similar in appearance. The left upper lobe is clear. No pulmonary parenchymal masses or abnormal nodules are demonstrated.  Within the upper abdomen the observed portions of the liver and spleen appear normal. The thoracic vertebral bodies are preserved in height. There is degenerative disc change at multiple levels. The sternum exhibits no acute abnormalities. The observed portions of the ribs exhibit no lytic or blastic lesions.  Review of the MIP images confirms the above findings.  IMPRESSION 1. There is no evidence of an acute pulmonary embolism. No acute thoracic aortic pathology is demonstrated either. 2. There is mild enlargement of the cardiac chambers. The central pulmonary vascularity is prominent but no  pruning is demonstrated more peripherally. 3. Patchy densities in the right upper lobe and in both lower lobes suggests subsegmental atelectasis or early pneumonia. 4. There is no mediastinal or hilar lymphadenopathy. There is no pleural nor pericardial effusion.  SIGNATURE  Electronically Signed   By: David  [place]   On: 09/01/2013 15:13   Labs: Basic Metabolic Panel:  Recent Labs Lab 09/01/13 1325 09/01/13 2227 09/02/13 0145 09/03/13 0615 09/04/13 0533 09/05/13 0511  NA 143  --  139 140 142 140  K 4.5  --  4.8 4.6 4.6 4.4  CL 99  --  96 95* 94* 96  CO2 32  --  30 32 36* 29  GLUCOSE 110*  --  99 96 89 84  BUN 37*  --  35* 30* 29* 31*  CREATININE 1.50* 1.62* 1.55* 1.36* 1.42* 1.41*  CALCIUM 9.3  --  9.0 9.2 9.4 9.1    CBC:  Recent Labs Lab 09/01/13 1325 09/01/13 2227 09/05/13 0511  WBC 8.8 9.6 7.2  NEUTROABS 6.4  --   --   HGB 14.7 14.4 13.8  HCT 48.1* 45.7 42.8  MCV 96.2 95.4 91.6  PLT 157 147* 142*   Cardiac Enzymes:  Recent Labs Lab 09/01/13 1325 09/01/13 2227 09/02/13 0145 09/02/13 0805  TROPONINI <0.30 <0.30 <0.30 <0.30   BNP: BNP (last 3 results)  Recent Labs  09/01/13 1402 09/05/13 0511  PROBNP 7171.0* 1757.0*    Signed:  Armen Pickup  [facility] 09/05/2013, 3:32 PM

## 2013-09-05 NOTE — Progress Notes (Signed)
Released.  Congestive heart failure education reviewed with client, along with low sodium diet needs.  Verbalized understanding of discharge instructions.

## 2013-09-07 ENCOUNTER — Encounter: Payer: Medicare Other | Admitting: Family Medicine

## 2013-09-14 ENCOUNTER — Ambulatory Visit (INDEPENDENT_AMBULATORY_CARE_PROVIDER_SITE_OTHER): Payer: Medicare Other | Admitting: Family Medicine

## 2013-09-14 ENCOUNTER — Encounter: Payer: Self-pay | Admitting: Family Medicine

## 2013-09-14 ENCOUNTER — Encounter: Payer: Medicare Other | Admitting: Family Medicine

## 2013-09-14 VITALS — BP 98/62 | HR 76 | Temp 98.0°F | Ht 68.0 in | Wt 323.0 lb

## 2013-09-14 DIAGNOSIS — I10 Essential (primary) hypertension: Secondary | ICD-10-CM | POA: Diagnosis not present

## 2013-09-14 DIAGNOSIS — I5032 Chronic diastolic (congestive) heart failure: Secondary | ICD-10-CM

## 2013-09-14 DIAGNOSIS — E559 Vitamin D deficiency, unspecified: Secondary | ICD-10-CM | POA: Diagnosis not present

## 2013-09-14 DIAGNOSIS — K219 Gastro-esophageal reflux disease without esophagitis: Secondary | ICD-10-CM

## 2013-09-14 DIAGNOSIS — E119 Type 2 diabetes mellitus without complications: Secondary | ICD-10-CM | POA: Diagnosis not present

## 2013-09-14 DIAGNOSIS — R0609 Other forms of dyspnea: Secondary | ICD-10-CM

## 2013-09-14 DIAGNOSIS — E7800 Pure hypercholesterolemia, unspecified: Secondary | ICD-10-CM

## 2013-09-14 DIAGNOSIS — Z79899 Other long term (current) drug therapy: Secondary | ICD-10-CM

## 2013-09-14 DIAGNOSIS — R0989 Other specified symptoms and signs involving the circulatory and respiratory systems: Secondary | ICD-10-CM

## 2013-09-14 DIAGNOSIS — E039 Hypothyroidism, unspecified: Secondary | ICD-10-CM

## 2013-09-14 DIAGNOSIS — F17200 Nicotine dependence, unspecified, uncomplicated: Secondary | ICD-10-CM

## 2013-09-14 DIAGNOSIS — R0683 Snoring: Secondary | ICD-10-CM

## 2013-09-14 LAB — POCT GLYCOSYLATED HEMOGLOBIN (HGB A1C): HEMOGLOBIN A1C: 5.7

## 2013-09-14 NOTE — Progress Notes (Signed)
Chief Complaint  Patient presents with  . Hypertension    nonfasting med check.    Patient presents for routine med check, as well as hospital follow-up. She was hospitalized 3/10-14 with CHF exacerbation, with symptoms on admission of DOE and 10 pound weight gain. She was discharged on lasix 11m BID.  Weight is unchanged from her discharge. She has slight swelling just at the end of the day if she has been on her feet, nothing like prior to being in the hospital. She states that her Mg was very low in the hospital, and has questions about that.  She is complaining of feeling very tired, and of charlie horses.  She continues to have some shortness of breath, but much better than prior to hospitalization.  Review of chart shows that she was 100 pounds lighter the end of 201/29/13 and she has gained 30 pounds since her last visit here 6 months ago.  Diabetes follow-up:  She has never checked her blood sugars, has never been on medications.  Sugars had improved with weight loss.  She has since regained weight.  Numbers in the hospital looked okay, but A1c wasn't checked--last was 6 in September. Denies polydipsia, has some frequent urination due to diuretics.  Last eye exam was in June.  Patient follows a low sugar diet, although admits noncompliance from Thanksgiving through early J01/29/2024(when her mother-in-law was very ill, traveling a lot).  She passed away in J01/29/2024 and has been better with her diet since that time. She checks feet regularly without concerns.  Hypertension follow-up:  Blood pressures at home have been good--123/78 this morning; denies seeing BP's <<364systolic at home since discharge.  Denies dizziness, headaches. She does describe a very slight tightness in her chest that she associates with fluid on her heart--much improved. She is complaining of a lot of charlie horses in her hands/fingers and legs. No recurrent edema (very mild at end of day).  Hyperlipidemia follow-up:  Patient is  reportedly following a low-fat, low cholesterol diet--since JJanuary 29, 2024 had been noncompliant with diet from October through J2024-01-29 Compliant with medications and denies medication side effects. Last lipids were checked a year ago.  She isn't fasting today.  GERD follow-up:  Compliant with medications, denies any recurrent symptoms.  Denies dysphagia.  She had low Mg in hospital; is taking chronic PPI.  Hypothyroidism: Compliant with medications, taking an hour before all other medications. No skin/hair changes.  Has had loose stools since discharge from the hospital, nonbloody, no abdominal pain. She is very fatigued.  Last TSH was 6 months ago (and was normal)  Tobacco abuse: she is still smoking 1/2 PPD.  She has quit twice, but restarted.  She had no problem not smoking during hospitalization.  Snoring.  No known apnea.  She has never had sleep study.  She can't sleep more than 3 hours at a time at night, wakes up frequently, but falls back asleep quickly. She thinks she gets up about 6x/night.  Not due to She cannot lay flat at night, needs to use her adjustable bed (not any different than usual for her).  She wakes up feeling pretty "bouncy" and refreshed during the day, but gets tired later and has to take a nap.  Vitamin D--h/o low in the past.  Last check was 37 in September.  Past Medical History  Diagnosis Date  . Obesity   . Hypertension   . Hyperlipidemia   . Hypothyroidism   . Venous stasis of lower  extremity     a. chronic LEE  . Tobacco dependence     a. 46 pack yr hx.  . Chest pain     a. 12/2011 Neg Lexi MV, EF 56%.    . Pericardial effusion     a. Large, s/p pericardiocentesis 01/02/12 yielding 600mL fluid (neg malignant cells, only inflammatory cells)  . DOE (dyspnea on exertion)     Chronic  . Anemia     Noted 12/2011  . Atrial fib/flutter, transient     In setting of pericardial effusion 12/2011  . Diastolic CHF   . Pneumonia 09/01/2013    "for the first time"  (09/01/2013)  . Diabetes mellitus     "diet controlled; never been on RX; don't check sugar @ home" (09/01/2013)  . GERD (gastroesophageal reflux disease)    Past Surgical History  Procedure Laterality Date  . Breast biopsy Right 1980's    a. Benign   . Cholecystectomy  1980's  . Appendectomy  ~ 2008  . Pericardiocentesis  12/2011    yielding 600mL fluid (neg malignant cells, only inflammatory cells)/notes 12/30/2011   History   Social History  . Marital Status: Married    Spouse Name: N/A    Number of Children: 0  . Years of Education: N/A   Occupational History  . Retired (human resources) [employer]   Social History Main Topics  . Smoking status: Current Every Day Smoker -- 1.00 packs/day for 49 years    Types: Cigarettes  . Smokeless tobacco: Never Used     Comment: 1 ppd since age 18; quit x 7 months with Chantix in past  . Alcohol Use: 8.4 oz/week    14 Glasses of wine per week     Comment: 09/01/2013 "couple glasses of wine/night"  . Drug Use: No  . Sexual Activity: No   Other Topics Concern  . Not on file   Social History Narrative   Lives in [place] with husband.  Cares for her elderly mother who lives next door   Outpatient Encounter Prescriptions as of 09/14/2013  Medication Sig  . aspirin EC 81 MG EC tablet Take 1 tablet (81 mg total) by mouth daily.  . atorvastatin (LIPITOR) 40 MG tablet Take 40 mg by mouth daily.  . benazepril (LOTENSIN) 20 MG tablet Take 20 mg by mouth daily.  . Cholecalciferol (VITAMIN D) 1000 UNITS capsule Take 1,000 Units by mouth daily.   . diltiazem (CARDIZEM CD) 240 MG 24 hr capsule Take 240 mg by mouth daily.  . furosemide (LASIX) 40 MG tablet Take 1 tablet (40 mg total) by mouth 2 (two) times daily.  . levothyroxine (SYNTHROID, LEVOTHROID) 50 MCG tablet Take 1 tablet (50 mcg total) by mouth daily.  . metoprolol tartrate (LOPRESSOR) 25 MG tablet Take 1 tablet (25 mg total) by mouth 2 (two) times daily.  . omeprazole (PRILOSEC) 20  MG capsule Take 1 capsule (20 mg total) by mouth 2 (two) times daily.  . potassium chloride (K-DUR,KLOR-CON) 10 MEQ tablet Take 10 mEq by mouth daily.  . nitroGLYCERIN (NITROSTAT) 0.4 MG SL tablet Place 1 tablet (0.4 mg total) under the tongue every 5 (five) minutes as needed for chest pain (up to 3 doses).   Allergies  Allergen Reactions  . Augmentin [Amoxicillin-Pot Clavulanate]     Abdominal pain   ROS:  Denies fevers, chills, URI symptoms, allergies, palpitations, significant edema, joint pains, bleeding, bruising, nausea, vomiting.  Moods stable.  +fatigue, muscle cramps, some loose stools; smoker's   cough.  Mild shortness of breath.  See HPI.  PHYSICAL EXAM: BP 98/62  Pulse 76  Temp(Src) 98 F (36.7 C) (Oral)  Ht 5' 8" (1.727 m)  Wt 323 lb (146.512 kg)  BMI 49.12 kg/m2 Well developed, somewhat tired appearing female, otherwise pleasant in no distress HEENT:  PERRL, EOMI, conjunctiva clear.  OP clear with moist mucus membranes Neck: no lymphadenopathy, thyromegaly or carotid bruit Heart: regular rate and rhythm Lungs: clear bilaterally Abdomen: obese, nontender, no organomegaly or mass. Normal bowel sounds Extremities:  Feet--hyperpigmented/venous stasis changes bilaterally.  Skin intact. Only trace edema medially near malleoli Skin is intact.  Normal sensation, no rashes/lesions.  Normal monofilament exam  Lab Results  Component Value Date   HGBA1C 5.7 09/14/2013    ASSESSMENT/PLAN:  Type II or unspecified type diabetes mellitus without mention of complication, not stated as uncontrolled - controlled by diet.  no need for meds at this time.  continue diet; weight loss - Plan: HM Diabetes Foot Exam, Comprehensive metabolic panel, HgB A1c  Essential hypertension, benign - controlled; low today, asymptomatic, likely related to diuretics - Plan: Comprehensive metabolic panel, Split night study  Pure hypercholesterolemia - continue statin.  return for fasting lipids - Plan:  Comprehensive metabolic panel, Lipid panel  Unspecified hypothyroidism - Plan: TSH  Unspecified vitamin D deficiency - Plan: Vit D  25 hydroxy (rtn osteoporosis monitoring)  Tobacco use disorder - risks reviewed; available help reviewed. encouraged cessation  Chronic diastolic heart failure - recent hospitalization; no recurrent fluid overload. recheck BNP.  f/u with Dr. Nahser as scheduled - Plan: Comprehensive metabolic panel, Pro b natriuretic peptide, Split night study  Morbid obesity with body mass index of 40.0-49.9 - Plan: Split night study  GERD (gastroesophageal reflux disease) - stable.  low Mg in hospital, recheck  Encounter for long-term (current) use of other medications - Plan: Comprehensive metabolic panel, Vit D  25 hydroxy (rtn osteoporosis monitoring), TSH, Pro b natriuretic peptide, Magnesium  Snoring - Plan: Split night study   c-met, BNP, TSH, vitamin D, Mg today  Expect possible electrolyte abnormalities and/or prerenal azotemia on labs from today given her symtpoms.  Copies to Dr. Nahser (appt tomorrow)  Refer for sleep study.  Return for fasting lipids (nonfasting today)   F/u 6 mos for fasting med check, sooner prn. 

## 2013-09-14 NOTE — Patient Instructions (Signed)
We are referring you for a sleep study to look for sleep apnea.  Please do your best to get back on track re: diet, weight loss. Your sugars remain good (no longer in the diabetic range, even with the regain of weight).  Please try and quit smoking--start thinking about why/when you smoke (habit, boredom, stress) in order to come up with effective strategies to cut back or quit. Available resources to help you quit include free counseling through [facility]uitline ([place]Quitline.com or 1-800-QUITNOW), smoking cessation classes through [facility] (call to find out schedule), over-the-counter nicotine replacements, and e-cigarettes (although this may not help break the hand-mouth habit).  Many insurance companies also have smoking cessation programs (which may decrease the cost of patches, meds if enrolled).  If these methods are not effective for you, and you are motivated to quit, return to discuss the possibility of prescription medications.  Please set a quit date, and get help if you need it.  Follow up with Dr. Elease Hashimoto tomorrow, as scheduled.  He should be able to see the results of all the labs done today. Return fasting to have your cholesterol checked (hasn't been checked in a year).

## 2013-09-15 ENCOUNTER — Ambulatory Visit (INDEPENDENT_AMBULATORY_CARE_PROVIDER_SITE_OTHER): Payer: Medicare Other | Admitting: Cardiovascular Disease

## 2013-09-15 ENCOUNTER — Encounter: Payer: Self-pay | Admitting: Family Medicine

## 2013-09-15 ENCOUNTER — Encounter: Payer: Self-pay | Admitting: Cardiovascular Disease

## 2013-09-15 VITALS — BP 112/68 | HR 70 | Ht 68.0 in | Wt 326.4 lb

## 2013-09-15 DIAGNOSIS — I5032 Chronic diastolic (congestive) heart failure: Secondary | ICD-10-CM

## 2013-09-15 LAB — COMPREHENSIVE METABOLIC PANEL
ALK PHOS: 95 U/L (ref 39–117)
ALT: 12 U/L (ref 0–35)
AST: 17 U/L (ref 0–37)
Albumin: 4.1 g/dL (ref 3.5–5.2)
BILIRUBIN TOTAL: 0.7 mg/dL (ref 0.2–1.2)
BUN: 29 mg/dL — ABNORMAL HIGH (ref 6–23)
CO2: 32 mEq/L (ref 19–32)
Calcium: 9.5 mg/dL (ref 8.4–10.5)
Chloride: 98 mEq/L (ref 96–112)
Creat: 1.19 mg/dL — ABNORMAL HIGH (ref 0.50–1.10)
Glucose, Bld: 89 mg/dL (ref 70–99)
Potassium: 4.9 mEq/L (ref 3.5–5.3)
SODIUM: 142 meq/L (ref 135–145)
TOTAL PROTEIN: 7.3 g/dL (ref 6.0–8.3)

## 2013-09-15 LAB — PRO B NATRIURETIC PEPTIDE: Pro B Natriuretic peptide (BNP): 3299 pg/mL — ABNORMAL HIGH (ref <126)

## 2013-09-15 LAB — TSH: TSH: 5.399 u[IU]/mL — ABNORMAL HIGH (ref 0.350–4.500)

## 2013-09-15 LAB — VITAMIN D 25 HYDROXY (VIT D DEFICIENCY, FRACTURES): Vit D, 25-Hydroxy: 41 ng/mL (ref 30–89)

## 2013-09-15 LAB — MAGNESIUM: Magnesium: 2 mg/dL (ref 1.5–2.5)

## 2013-09-15 NOTE — Patient Instructions (Signed)
Your physician wants you to follow-up in: 6 MONTHS.  You will receive a reminder letter in the mail two months in advance. If you don't receive a letter, please call our office to schedule the follow-up appointment.  Your physician recommends that you continue on your current medications as directed. Please refer to the Current Medication list given to you today.  

## 2013-09-15 NOTE — Assessment & Plan Note (Signed)
Nancy West presents following an admission to the hospital for acute on chronic diastolic congestive heart failure. I actually think that she has right heart failure due to either sleep apnea or COPD and also due to her morbid obesity.   On her echocardiogram, right ventricle and right atrium are significantly enlarged. We will not able to measure an RV pressure because was no significant tricuspid regurgitation.  She needs to get a sleep study that she will be seeing her medical Dr. for further evaluation of this. She'll also followup with a basic metabolic profile with her medical doctor in the next 3-4 weeks.  She also has a long history of cigarette smoking. I have advised her to see a pulmonologist for further evaluation COPD.  She really needs to stop smoking completely.      I'll see her again in 6 months for followup visit.

## 2013-09-15 NOTE — Progress Notes (Signed)
Nancy FreesSuzanne G West  Date of Birth  28-Jan-1946   [address]     [address] [place], [place]  [id number] [phone]  Fax  [phone]  Problem List: 1. PVCs 2. Pericardial effusion  3. Atrial Fibrillation 4. Hypothyroidism 5. Hyperlipidemia  History of Present Illness:  Nancy ChessmanSuzanne is a 68 year old female who is a previous patient of Dr. Reyes IvanKersey. She has a history of morbid obesity, diastolic dysfunction, hypertension, and cigarette smoking. She presents today for followup of her palpitations. She was hospitalized earlier this summer with pericardial effusion and had a pericardial window.  She has a history of hypertension. Blood pressure has been fairly good and her home meds.   It is a bit elevated today. She has white coat hypertension. She denies any chest pain or shortness breath.  She's doing water aerobics on occasion. She also works out on Environmental education officerthe elliptical machine. Unfortunately, she continues to smoke. We placed an event monitor on her during her last visit. She was found to have numerous premature ventricular contractions. There was no evidence of atrial fibrillation.  Nov. 10, 2014:  Nancy ChessmanSuzanne is doing well.  She is starting exercising again.  On the treadmill.  She had a pericardial effusion  In June 2013 - s/p pericardial tap   September 15, 2013:  She was recently in the hosptial for Acute on chronic disastolic CHF.         Her Lasix was doubled.  She feels very fatigued and she still has some dyspnea.   She has not smoked for the past several days - but did not commit that she had really stopped.    The echo showed mod-severe RV dilitation and RA dilitation.  No significant tricuspid regurgitation so we cannot determine the estimated PA pressure. She was seen by her medical doctor yesterday. A sleep study has been ordered.   She had a normal stress myoview in 2013: Overall Impression: Normal stress nuclear study.  LV Ejection Fraction: 56%. LV Wall Motion: NL LV  Function; NL Wall Motion  12/25/2011   Current Outpatient Prescriptions on File Prior to Visit  Medication Sig Dispense Refill  . aspirin EC 81 MG EC tablet Take 1 tablet (81 mg total) by mouth daily.      Marland Kitchen. atorvastatin (LIPITOR) 40 MG tablet Take 40 mg by mouth daily.      . benazepril (LOTENSIN) 20 MG tablet Take 20 mg by mouth daily.      . Cholecalciferol (VITAMIN D) 1000 UNITS capsule Take 1,000 Units by mouth daily.       Marland Kitchen. diltiazem (CARDIZEM CD) 240 MG 24 hr capsule Take 240 mg by mouth daily.      . furosemide (LASIX) 40 MG tablet Take 1 tablet (40 mg total) by mouth 2 (two) times daily.  60 tablet  1  . levothyroxine (SYNTHROID, LEVOTHROID) 50 MCG tablet Take 1 tablet (50 mcg total) by mouth daily.  90 tablet  3  . metoprolol tartrate (LOPRESSOR) 25 MG tablet Take 1 tablet (25 mg total) by mouth 2 (two) times daily.  60 tablet  6  . omeprazole (PRILOSEC) 20 MG capsule Take 1 capsule (20 mg total) by mouth 2 (two) times daily.      . potassium chloride (K-DUR,KLOR-CON) 10 MEQ tablet Take 10 mEq by mouth daily.      . nitroGLYCERIN (NITROSTAT) 0.4 MG SL tablet Place 1 tablet (0.4 mg total) under the tongue every 5 (five) minutes as needed for chest  pain (up to 3 doses).  25 tablet  3   No current facility-administered medications on file prior to visit.    Allergies  Allergen Reactions  . Augmentin [Amoxicillin-Pot Clavulanate]     Abdominal pain    Past Medical History  Diagnosis Date  . Obesity   . Hypertension   . Hyperlipidemia   . Hypothyroidism   . Venous stasis of lower extremity     a. chronic LEE  . Tobacco dependence     a. 46 pack yr hx.  . Chest pain     a. 12/2011 Neg Lexi MV, EF 56%.    . Pericardial effusion     a. Large, s/p pericardiocentesis 01/02/12 yielding fluid (neg malignant cells, only inflammatory cells)  . DOE (dyspnea on exertion)     Chronic  . Anemia     Noted 12/2011  . Atrial fib/flutter, transient     In setting of pericardial  effusion 12/2011  . Diastolic CHF   . Pneumonia 09/01/2013    "for the first time" (09/01/2013)  . Diabetes mellitus     "diet controlled; never been on RX; don't check sugar @ home" (09/01/2013)  . GERD (gastroesophageal reflux disease)     Past Surgical History  Procedure Laterality Date  . Breast biopsy Right 1980's    a. Benign   . Cholecystectomy  1980's  . Appendectomy  ~ 2008  . Pericardiocentesis  12/2011    yielding fluid (neg malignant cells, only inflammatory cells)/notes 12/30/2011    History  Smoking status  . Current Every Day Smoker -- 1.00 packs/day for 49 years  . Types: Cigarettes  Smokeless tobacco  . Never Used    Comment: 1 ppd since age 63; quit x 7 months with Chantix in past    History  Alcohol Use  . 8.4 oz/week  . 14 Glasses of wine per week    Comment: 09/01/2013 "couple glasses of wine/night"    Family History  Problem Relation Age of Onset  . Cancer Mother     breast cancer (70), colon cancer  . Hypertension Mother   . Arthritis Mother   . Anxiety disorder Mother   . Diabetes Father     died in MVA    Reviw of Systems:  Reviewed in the HPI.  All other systems are negative.  Physical Exam: BP 112/68  Pulse 70  Ht 5\' 8"  (1.727 m)  Wt 326 lb 6.4 oz (148.054 kg)  BMI 49.64 kg/m2 The patient is alert and oriented x 3.  The mood and affect are normal.   Skin: warm and dry.  Color is normal.    HEENT:   the sclera are nonicteric.  The mucous membranes are moist.  The carotids are 2+ without bruits.  There is no thyromegaly.  There is no JVD.    Lungs: Slight wheezing bilaterally  Heart: regular rate with a normal S1 and S2.  There are no murmurs, gallops, or rubs. The PMI is not displaced.     Abdomen: She is morbidly obese. good bowel sounds.  There is no guarding or rebound.  There is no hepatosplenomegaly or tenderness.  There are no masses.   Extremities:  1+ edema but bilaterally.  She has chronic stasis changes.     Neuro:   Cranial nerves II - XII are intact.  Motor and sensory functions are intact.    The gait is normal.  ECG:  Assessment / Plan:

## 2013-09-15 NOTE — Addendum Note (Signed)
Addended by: Joselyn Arrow on: 09/15/2013 07:45 AM   Modules accepted: Orders

## 2013-09-17 ENCOUNTER — Other Ambulatory Visit: Payer: Medicare Other

## 2013-09-18 ENCOUNTER — Other Ambulatory Visit: Payer: Medicare Other

## 2013-09-21 ENCOUNTER — Other Ambulatory Visit: Payer: Medicare Other

## 2013-09-21 DIAGNOSIS — E039 Hypothyroidism, unspecified: Secondary | ICD-10-CM | POA: Diagnosis not present

## 2013-09-21 DIAGNOSIS — E7800 Pure hypercholesterolemia, unspecified: Secondary | ICD-10-CM

## 2013-09-21 LAB — LIPID PANEL
CHOL/HDL RATIO: 2.6 ratio
Cholesterol: 137 mg/dL (ref 0–200)
HDL: 53 mg/dL (ref >39)
LDL Cholesterol: 69 mg/dL (ref 0–99)
TRIGLYCERIDES: 73 mg/dL (ref <150)
VLDL: 15 mg/dL (ref 0–40)

## 2013-09-21 LAB — TSH: TSH: 3.225 u[IU]/mL (ref 0.350–4.500)

## 2013-09-28 ENCOUNTER — Other Ambulatory Visit: Payer: Medicare Other

## 2013-10-16 ENCOUNTER — Other Ambulatory Visit: Payer: Self-pay | Admitting: Cardiovascular Disease

## 2013-10-20 ENCOUNTER — Ambulatory Visit (HOSPITAL_BASED_OUTPATIENT_CLINIC_OR_DEPARTMENT_OTHER): Payer: Medicare Other | Attending: Family Medicine | Admitting: Radiology

## 2013-10-20 VITALS — Ht 68.0 in | Wt 320.0 lb

## 2013-10-20 DIAGNOSIS — Z6841 Body Mass Index (BMI) 40.0 and over, adult: Secondary | ICD-10-CM | POA: Insufficient documentation

## 2013-10-20 DIAGNOSIS — R0989 Other specified symptoms and signs involving the circulatory and respiratory systems: Secondary | ICD-10-CM | POA: Insufficient documentation

## 2013-10-20 DIAGNOSIS — R0609 Other forms of dyspnea: Secondary | ICD-10-CM | POA: Diagnosis not present

## 2013-10-20 DIAGNOSIS — I5032 Chronic diastolic (congestive) heart failure: Secondary | ICD-10-CM | POA: Diagnosis not present

## 2013-10-20 DIAGNOSIS — I10 Essential (primary) hypertension: Secondary | ICD-10-CM | POA: Diagnosis not present

## 2013-10-20 DIAGNOSIS — R0683 Snoring: Secondary | ICD-10-CM

## 2013-10-24 DIAGNOSIS — G4730 Sleep apnea, unspecified: Secondary | ICD-10-CM

## 2013-10-24 DIAGNOSIS — G4710 Hypersomnia, unspecified: Secondary | ICD-10-CM

## 2013-10-24 NOTE — Sleep Study (Signed)
   NAME: Nancy West DATE OF BIRTH:  05/28/1946 MEDICAL RECORD NUMBER [id number]  LOCATION: [facility]  PHYSICIAN: Clinton D Young  DATE OF STUDY: 10/20/2013  SLEEP STUDY TYPE: Nocturnal Polysomnogram               REFERRING PHYSICIAN: Joselyn Arrow, MD  INDICATION FOR STUDY: Hypersomnia with sleep apnea  EPWORTH SLEEPINESS SCORE:   10/24 HEIGHT: 5\' 8"  (172.7 cm)  WEIGHT: 320 lb (145.151 kg)    Body mass index is 48.67 kg/(m^2).  NECK SIZE: 18.5 in.  MEDICATIONS: Charted for review  SLEEP ARCHITECTURE: Split study protocol. During the diagnostic phase, total sleep time 121.5 minutes with sleep efficiency 50.7%. Stage I was 22.6%, stage II 77.4%, stage is 3 and REM were absent. Sleep latency 29.5 minutes, awake after sleep onset 79 minutes, arousal index 28.6. Bedtime medication: None  RESPIRATORY DATA: Apnea hypopneas index (AHI) 22.2 per hour. 45 total events scored including 40 obstructive apneas and 5 hypopneas. Events were not positional. CPAP was titrated to 14 CWP, AHI 8 per hour. To improve toleration of higher pressures, she was then changed to bilevel (BiPAP) and titrated to inspiratory 20 and expiratory 16 CWP with a few residual central events scored as AHI 15 per hour. She wore a medium [employer] Simplus fullface mask with heated humidifier.  OXYGEN DATA: Moderately loud snoring before CPAP with oxygen desaturation to a nadir of 80%. Because of continued desaturation into the 80% range, supplemental oxygen was added at 2 L per minute as of 02:44AM. Snoring was prevented at final pressures.  CARDIAC DATA: Sinus rhythm with PACs and PVCs  MOVEMENT/PARASOMNIA: No significant movement disturbance, bathroom x1  IMPRESSION/ RECOMMENDATION:   1) Difficulty maintaining sleep throughout the study. Sustained sleep was better maintained with CPAP but consider adding treatment for insomnia as part of management. 2) Moderate obstructive sleep apnea/hypopnea  syndrome, AHI 22.2 per hour with events in all positions. Moderately loud snoring before CPAP with oxygen desaturation to a nadir of 80% on room air.  3) CPAP was titrated to 14 CWP with residual AHI 28.2 per hour. For better toleration of higher pressures, she was then changed to bilevel (BiPAP) and a final inspiratory pressure of 20 CWP and expiratory pressure of 16 CWP. Residual AHI of 15 per hour reflected a few residual central apneas and hypopneas. She wore a medium [employer] Simplus fullface mask with heated humidifier.  4)  Oxygen saturation on room air on arrival, while awake, was only 80%, indicating underlying cardiopulmonary disease. During her sleep study, persistent oxygen saturation between 80% and 86% was addressed per protocol, with addition of supplemental oxygen at 2 L per minute. Mean oxygen saturation during CPAP titration with supplemental oxygen was still only 82.5% on room air.  Signed Jetty Duhamel M.D. Waymon Budge Diplomate, Biomedical engineer of Sleep Medicine  ELECTRONICALLY SIGNED ON:  10/24/2013, 10:02 AM [facility] PH: [phone]   FX: [phone] ACCREDITED BY THE AMERICAN ACADEMY OF SLEEP MEDICINE

## 2013-11-03 ENCOUNTER — Other Ambulatory Visit (HOSPITAL_COMMUNITY): Payer: Self-pay | Admitting: Cardiovascular Disease

## 2013-11-05 ENCOUNTER — Encounter: Payer: Self-pay | Admitting: Pulmonary Disease

## 2013-11-05 ENCOUNTER — Ambulatory Visit (INDEPENDENT_AMBULATORY_CARE_PROVIDER_SITE_OTHER): Payer: Medicare Other | Admitting: Pulmonary Disease

## 2013-11-05 VITALS — BP 120/60 | HR 78 | Ht 68.0 in | Wt 341.0 lb

## 2013-11-05 DIAGNOSIS — G4733 Obstructive sleep apnea (adult) (pediatric): Secondary | ICD-10-CM | POA: Diagnosis not present

## 2013-11-05 DIAGNOSIS — R0600 Dyspnea, unspecified: Secondary | ICD-10-CM

## 2013-11-05 DIAGNOSIS — F17200 Nicotine dependence, unspecified, uncomplicated: Secondary | ICD-10-CM

## 2013-11-05 DIAGNOSIS — E662 Morbid (severe) obesity with alveolar hypoventilation: Secondary | ICD-10-CM | POA: Diagnosis not present

## 2013-11-05 DIAGNOSIS — J449 Chronic obstructive pulmonary disease, unspecified: Secondary | ICD-10-CM

## 2013-11-05 DIAGNOSIS — I5032 Chronic diastolic (congestive) heart failure: Secondary | ICD-10-CM

## 2013-11-05 DIAGNOSIS — R0989 Other specified symptoms and signs involving the circulatory and respiratory systems: Secondary | ICD-10-CM

## 2013-11-05 DIAGNOSIS — R0609 Other forms of dyspnea: Secondary | ICD-10-CM

## 2013-11-05 HISTORY — DX: Obstructive sleep apnea (adult) (pediatric): G47.33

## 2013-11-05 MED ORDER — MOMETASONE FURO-FORMOTEROL FUM 100-5 MCG/ACT IN AERO: 2.0000 | INHALATION_SPRAY | Freq: Two times a day (BID) | RESPIRATORY_TRACT | Status: DC

## 2013-11-05 NOTE — Assessment & Plan Note (Signed)
Her sleep study results were reviewed.  She has moderate sleep apnea.  She had sub-optimal titration during second portion of the study.  I have reviewed the recent sleep study results with the patient.  We discussed how sleep apnea can affect various health problems including risks for hypertension, cardiovascular disease, and diabetes.  We also discussed how sleep disruption can increase risks for accident, such as while driving.  Weight loss as a means of improving sleep apnea was also reviewed.  Additional treatment options discussed were CPAP therapy, oral appliance, and surgical intervention.  Will arrange for full night titration study starting on CPAP.  Transition to BiPAP +/- oxygen as needed.

## 2013-11-05 NOTE — Progress Notes (Signed)
Chief Complaint  Patient presents with  . Pulmonary Consult    Self Referral for SOB    History of Present Illness: Nancy West is a 68 y.o. female smoker for evaluation of dyspnea.  She has history of diastolic heart failure.  She was in hospital recently, and has not fully recovered since.  She gets winded and fatigued with minimal activity.  She gets cough with clear sputum.  She also gets wheezing.  She has never been told she has COPD, but her mother did (was former pt of mine).  She denies fever, hemoptysis, or chest pain.  She gets leg swelling, and can gain 20 lbs in a week.  She started smoking at age 68, smoked 2 packs per day, and is now smoking 5 cigarettes per day.    She denies history of pneumonia or exposure to tuberculosis.  She does get frequent episodes of bronchitis.  Her SaO2 on arrival today was 81% on room air at rest.  Tests: CT chest 09/01/13 >> patchy ATX Echo 09/02/13 >> EF 55 to 65%, grade 2 diastolic dysfx, mod LA dilation, mod/severe RV dilation PSG 10/21/13 >> AHI 22.2, SpO2 low 80% RA SaO2 at rest 11/05/13 >> 81%; start 2 liters oxygen 24/7  Nancy West  has a past medical history of Obesity; Hypertension; Hyperlipidemia; Hypothyroidism; Venous stasis of lower extremity; Tobacco dependence; Chest pain; Pericardial effusion; DOE (dyspnea on exertion); Anemia; Atrial fib/flutter, transient; Diastolic CHF; Pneumonia (09/01/2013); Diabetes mellitus; and GERD (gastroesophageal reflux disease).  Nancy West  has past surgical history that includes Breast biopsy (Right, 1980's); Cholecystectomy (1980's); Appendectomy (~ 2008); and Pericardiocentesis (12/2011).  Prior to Admission medications   Medication Sig Start Date End Date Taking? Authorizing Provider  aspirin EC 81 MG EC tablet Take 1 tablet (81 mg total) by mouth daily. 09/05/13  Yes Vassie Lollarlos Madera, MD  atorvastatin (LIPITOR) 40 MG tablet Take 40 mg by mouth daily.   Yes Historical Provider, MD   benazepril (LOTENSIN) 20 MG tablet Take 20 mg by mouth daily.   Yes Historical Provider, MD  Cholecalciferol (VITAMIN D) 1000 UNITS capsule Take 1,000 Units by mouth daily.    Yes Historical Provider, MD  diltiazem (CARDIZEM CD) 240 MG 24 hr capsule Take 240 mg by mouth daily.   Yes Historical Provider, MD  furosemide (LASIX) 40 MG tablet TAKE 1 TABLET BY MOUTH TWICE DAILY   Yes Vesta MixerPhilip J Nahser, MD  levothyroxine (SYNTHROID, LEVOTHROID) 50 MCG tablet Take 1 tablet (50 mcg total) by mouth daily. 03/09/13  Yes Joselyn ArrowEve Knapp, MD  metoprolol tartrate (LOPRESSOR) 25 MG tablet TAKE 1 TABLET BY MOUTH TWICE DAILY 10/16/13  Yes Vesta MixerPhilip J Nahser, MD  omeprazole (PRILOSEC) 20 MG capsule Take 1 capsule (20 mg total) by mouth 2 (two) times daily. 01/21/12  Yes Rosalio MacadamiaLori C Gerhardt, NP  potassium chloride (K-DUR,KLOR-CON) 10 MEQ tablet Take 10 mEq by mouth daily.   Yes Historical Provider, MD  nitroGLYCERIN (NITROSTAT) 0.4 MG SL tablet Place 1 tablet (0.4 mg total) under the tongue every 5 (five) minutes as needed for chest pain (up to 3 doses). 12/19/11 12/18/12  Jessica A Hope, PA-C    Allergies  Allergen Reactions  . Augmentin [Amoxicillin-Pot Clavulanate]     Abdominal pain    Her family history includes Anxiety disorder in her mother; Arthritis in her mother; Cancer in her mother; Diabetes in her father; Hypertension in her mother.  She  reports that she has been smoking Cigarettes.  She  has a 12.5 pack-year smoking history. She has never used smokeless tobacco. She reports that she drinks about 8.4 ounces of alcohol per week. She reports that she does not use illicit drugs.  Review of Systems  Constitutional: Positive for unexpected weight change. Negative for fever.  HENT: Negative for congestion, dental problem, ear pain, nosebleeds, postnasal drip, rhinorrhea, sinus pressure, sneezing, sore throat and trouble swallowing.   Eyes: Negative for redness and itching.  Respiratory: Positive for cough and shortness  of breath. Negative for chest tightness and wheezing.   Cardiovascular: Positive for chest pain and palpitations. Negative for leg swelling.  Gastrointestinal: Negative for nausea and vomiting.  Genitourinary: Negative for dysuria.  Musculoskeletal: Positive for arthralgias and joint swelling.  Skin: Negative for rash.  Neurological: Negative for headaches.  Hematological: Does not bruise/bleed easily.  Psychiatric/Behavioral: Positive for dysphoric mood. The patient is nervous/anxious.    Physical Exam:  General - No distress ENT - No sinus tenderness, no oral exudate, no LAN, no thyromegaly, TM clear, pupils equal/reactive, MP 3 Cardiac - s1s2 regular, no murmur, pulses symmetric Chest - faint b/l expiratory wheeze Back - No focal tenderness Abd - Soft, non-tender, no organomegaly, + bowel sounds Ext - 1+ edema Neuro - Normal strength, cranial nerves intact Skin - No rashes Psych - Normal mood, and behavior   Dg Chest 2 View  09/01/2013   CLINICAL DATA Shortness of breath, wheezing, cough, congestion, chest pain, fatigue, edema, history hypertension, diabetes, CHF, obesity, hyperlipidemia, smoker  EXAM CHEST  2 VIEW  COMPARISON 01/16/2012  FINDINGS Enlargement of cardiac silhouette.  Prominent central pulmonary arteries unchanged.  Mediastinal contours and pulmonary vascularity otherwise normal.  Lungs clear.  No pleural effusion or pneumothorax.  No acute osseous findings.  IMPRESSION Enlargement of cardiac silhouette.  Prominent central pulmonary arteries raising question of pulmonary arterial hypertension.  SIGNATURE  Electronically Signed   By: Ulyses Southward M.D.   On: 09/01/2013 13:53   Ct Angio Chest Pe W/cm &/or Wo Cm  09/01/2013   CLINICAL DATA History of CHF, now with acute dyspnea  EXAM CT ANGIOGRAPHY CHEST WITH CONTRAST  TECHNIQUE Multidetector CT imaging of the chest was performed using the standard protocol during bolus administration of intravenous contrast. Multiplanar CT  image reconstructions and MIPs were obtained to evaluate the vascular anatomy.  CONTRAST 15mL OMNIPAQUE IOHEXOL 350 MG/ML SOLN  COMPARISON DG CHEST 2 VIEW dated 09/01/2013  FINDINGS Contrast within the pulmonary arterial tree is normal in appearance. There are no filling defects to suggest an acute pulmonary embolism. The caliber of the thoracic aorta is normal. No false aortic lumen is demonstrated. The cardiac chambers are mildly enlarged. There are no pathologic size mediastinal or hilar nor axillary lymph nodes. The thoracic esophagus is normal in caliber. There is no pleural nor pericardial effusion.  At lung window settings there is a patchy area of increased density in the right upper lobe. The right middle lobe is clear. There is increased interstitial density in the posterior costophrenic gutter on the right. There is patchy density in the left costophrenic gutter that is similar in appearance. The left upper lobe is clear. No pulmonary parenchymal masses or abnormal nodules are demonstrated.  Within the upper abdomen the observed portions of the liver and spleen appear normal. The thoracic vertebral bodies are preserved in height. There is degenerative disc change at multiple levels. The sternum exhibits no acute abnormalities. The observed portions of the ribs exhibit no lytic or blastic lesions.  Review  of the MIP images confirms the above findings.  IMPRESSION 1. There is no evidence of an acute pulmonary embolism. No acute thoracic aortic pathology is demonstrated either. 2. There is mild enlargement of the cardiac chambers. The central pulmonary vascularity is prominent but no pruning is demonstrated more peripherally. 3. Patchy densities in the right upper lobe and in both lower lobes suggests subsegmental atelectasis or early pneumonia. 4. There is no mediastinal or hilar lymphadenopathy. There is no pleural nor pericardial effusion.  SIGNATURE  Electronically Signed   By: David  [place]   On:  09/01/2013 15:13    Lab Results  Component Value Date   WBC 7.2 09/05/2013   HGB 13.8 09/05/2013   HCT 42.8 09/05/2013   MCV 91.6 09/05/2013   PLT 142* 09/05/2013    Lab Results  Component Value Date   CREATININE 1.19* 09/14/2013   BUN 29* 09/14/2013   NA 142 09/14/2013   K 4.9 09/14/2013   CL 98 09/14/2013   CO2 32 09/14/2013    Lab Results  Component Value Date   ALT 12 09/14/2013   AST 17 09/14/2013   ALKPHOS 95 09/14/2013   BILITOT 0.7 09/14/2013    Lab Results  Component Value Date   TSH 3.225 09/21/2013    BNP    Component Value Date/Time   PROBNP 3299.00* 09/14/2013 1526      Assessment/Plan:  Coralyn Helling, MD [facility] Pager:  [phone]

## 2013-11-05 NOTE — Assessment & Plan Note (Signed)
Discussed importance of weight loss.  Will review in more detail at next visit.

## 2013-11-05 NOTE — Assessment & Plan Note (Signed)
She will need to use 2 liters oxygen 24/7.  Explained the importance of weight loss.

## 2013-11-05 NOTE — Assessment & Plan Note (Signed)
Related to OSA/OHS, diastolic heart failure, probable COPD, and deconditioning.

## 2013-11-05 NOTE — Assessment & Plan Note (Signed)
She has extensive history of smoking, and symptoms suggestive of COPD.  Will give her trial of dulera and arrange for pulmonary function testing.  She would likely benefit from monitored exercise program with pulmonary rehab >> will discuss more at next visit.

## 2013-11-05 NOTE — Patient Instructions (Signed)
Dulera two puffs twice per day >> rinse mouth after each use Will schedule CPAP titration study Will schedule breathing test (PFT) Oxygen 2 liters 24/7 Follow up in 6 weeks

## 2013-11-05 NOTE — Progress Notes (Deleted)
   Subjective:    Patient ID: Nancy West, female    DOB: 08/13/1945, 67 y.o.   MRN: [id number]  HPI    Review of Systems  Constitutional: Positive for unexpected weight change. Negative for fever.  HENT: Negative for congestion, dental problem, ear pain, nosebleeds, postnasal drip, rhinorrhea, sinus pressure, sneezing, sore throat and trouble swallowing.   Eyes: Negative for redness and itching.  Respiratory: Positive for cough and shortness of breath. Negative for chest tightness and wheezing.   Cardiovascular: Positive for chest pain and palpitations. Negative for leg swelling.  Gastrointestinal: Negative for nausea and vomiting.  Genitourinary: Negative for dysuria.  Musculoskeletal: Positive for arthralgias and joint swelling.  Skin: Negative for rash.  Neurological: Negative for headaches.  Hematological: Does not bruise/bleed easily.  Psychiatric/Behavioral: Positive for dysphoric mood. The patient is nervous/anxious.        Objective:   Physical Exam        Assessment & Plan:

## 2013-11-05 NOTE — Assessment & Plan Note (Signed)
She is followed by cardiology.

## 2013-11-13 ENCOUNTER — Telehealth: Payer: Self-pay | Admitting: Pulmonary Disease

## 2013-11-13 DIAGNOSIS — J449 Chronic obstructive pulmonary disease, unspecified: Secondary | ICD-10-CM

## 2013-11-13 NOTE — Telephone Encounter (Signed)
Fax for PA was received for pt [email or web address] # [phone] ID # [id number] Called and started PA  This went into clinical review and will receive a fax once decision is made. Will forward to [place] to f/u on

## 2013-11-17 ENCOUNTER — Other Ambulatory Visit: Payer: Medicare Other

## 2013-11-18 ENCOUNTER — Telehealth: Payer: Self-pay | Admitting: Family Medicine

## 2013-11-18 NOTE — Telephone Encounter (Signed)
Left message to call office needs to reschedule lab visit that was missed

## 2013-11-23 ENCOUNTER — Telehealth: Payer: Self-pay | Admitting: *Deleted

## 2013-11-23 MED ORDER — BUDESONIDE-FORMOTEROL FUMARATE 160-4.5 MCG/ACT IN AERO: 2.0000 | INHALATION_SPRAY | Freq: Two times a day (BID) | RESPIRATORY_TRACT | Status: DC

## 2013-11-23 NOTE — Telephone Encounter (Signed)
Patient advised.

## 2013-11-23 NOTE — Telephone Encounter (Signed)
If dose remained at 50, due again in September (unless she is having symptoms related to her thyroid).  You are correct.  I did not know about her mother--thanks.  Did we send a card from the office? (probably too late now)

## 2013-11-23 NOTE — Telephone Encounter (Signed)
I called to check on this status and was advised that the medication has been denied because due to medicare guidelines Elwin Sleight is not FDA approved for COPD. The covered alternative is spiriva. Please advise. Carron Curie, CMA

## 2013-11-23 NOTE — Telephone Encounter (Signed)
LMTCB for the pt 

## 2013-11-23 NOTE — Telephone Encounter (Signed)
Spiriva is not in same class of inhalers as Dulera >> not an equivalent exchange.  Please send order for symbicort 160/4.5 two puffs bid, dispense 1 inhaler with 5 refills.

## 2013-11-23 NOTE — Telephone Encounter (Signed)
I spoke with the pt and advised that insurance will not cover dulera, and advised that she try symbicort, Pt ok with this, rx sent.  Pt then mentioned that she wants an order placed for a POC. She states the portable system [facility] gave her is over 10lbs and is too heavy for her to handle. Order placed for POC. Carron Curie, CMA

## 2013-11-23 NOTE — Telephone Encounter (Signed)
Patient called and was unsure whether or not she was due for TSH or was to wait until her appt in Sept. She did not increase, she is still taking . I figured wait until Sept but wanted to be sure.  Also-wanted to let you know that Alvina Guggenbeuhler(mother) passed away in late 11-11-2022. Just an FYI. Thanks.

## 2013-12-02 ENCOUNTER — Other Ambulatory Visit (HOSPITAL_COMMUNITY): Payer: Self-pay | Admitting: Cardiovascular Disease

## 2013-12-15 ENCOUNTER — Other Ambulatory Visit: Payer: Self-pay | Admitting: Cardiovascular Disease

## 2013-12-16 ENCOUNTER — Ambulatory Visit (HOSPITAL_BASED_OUTPATIENT_CLINIC_OR_DEPARTMENT_OTHER): Payer: Medicare Other | Attending: Pulmonary Disease | Admitting: Radiology

## 2013-12-16 VITALS — Ht 68.0 in | Wt 310.0 lb

## 2013-12-16 DIAGNOSIS — Z79899 Other long term (current) drug therapy: Secondary | ICD-10-CM | POA: Diagnosis not present

## 2013-12-16 DIAGNOSIS — E662 Morbid (severe) obesity with alveolar hypoventilation: Secondary | ICD-10-CM

## 2013-12-16 DIAGNOSIS — G4730 Sleep apnea, unspecified: Secondary | ICD-10-CM | POA: Diagnosis not present

## 2013-12-16 DIAGNOSIS — Z7982 Long term (current) use of aspirin: Secondary | ICD-10-CM | POA: Insufficient documentation

## 2013-12-16 DIAGNOSIS — G4733 Obstructive sleep apnea (adult) (pediatric): Secondary | ICD-10-CM

## 2013-12-21 ENCOUNTER — Encounter: Payer: Self-pay | Admitting: Pulmonary Disease

## 2013-12-21 ENCOUNTER — Ambulatory Visit (INDEPENDENT_AMBULATORY_CARE_PROVIDER_SITE_OTHER): Payer: Medicare Other | Admitting: Pulmonary Disease

## 2013-12-21 VITALS — BP 128/60 | HR 75 | Ht 65.5 in | Wt 307.0 lb

## 2013-12-21 DIAGNOSIS — E662 Morbid (severe) obesity with alveolar hypoventilation: Secondary | ICD-10-CM | POA: Diagnosis not present

## 2013-12-21 DIAGNOSIS — J410 Simple chronic bronchitis: Secondary | ICD-10-CM | POA: Diagnosis not present

## 2013-12-21 DIAGNOSIS — G4733 Obstructive sleep apnea (adult) (pediatric): Secondary | ICD-10-CM | POA: Diagnosis not present

## 2013-12-21 DIAGNOSIS — J449 Chronic obstructive pulmonary disease, unspecified: Secondary | ICD-10-CM

## 2013-12-21 DIAGNOSIS — F17200 Nicotine dependence, unspecified, uncomplicated: Secondary | ICD-10-CM

## 2013-12-21 NOTE — Patient Instructions (Signed)
Will call with results of sleep study Will arrange for referral to pulmonary rehab class Follow up in 3 months

## 2013-12-21 NOTE — Progress Notes (Signed)
PFT done today. 

## 2013-12-21 NOTE — Progress Notes (Signed)
Chief Complaint  Patient presents with  . Follow-up    Review PFT.     History of Present Illness: Nancy West is a 68 y.o. female smoker with dyspnea.  She has hx of OSA/OHS on CPAP and home oxygen, diastolic heart failure and COPD.  Her breathing is doing better.  She is down to 3 cigarettes per day.  She still uses her oxygen at night, but does not feel like she needs this as much during the day.  She is not having as much cough or wheeze.  She denies chest pain or leg swelling.  She feels symbicort has helped.  She had her CPAP titration study done on 12/16/13.'  Her PFT's from earlier today showed moderate to severe obstruction, mild restriction, moderate diffusion defect, and no bronchodilator response.   TESTS: CT chest 09/01/13 >> patchy ATX  Echo 09/02/13 >> EF 55 to 65%, grade 2 diastolic dysfx, mod LA dilation, mod/severe RV dilation  PSG 10/21/13 >> AHI 22.2, SpO2 low 80%  RA SaO2 at rest 11/05/13 >> 81%; start 2 liters oxygen 24/7 PFT 12/21/13 >> FEV1 1.22 (48%) FEV1% 73, TLC 3.97 (75%), DLCO 57%, no BD  Nancy West  has a past medical history of Obesity; Hypertension; Hyperlipidemia; Hypothyroidism; Venous stasis of lower extremity; Tobacco dependence; Chest pain; Pericardial effusion; DOE (dyspnea on exertion); Anemia; Atrial fib/flutter, transient; Diastolic CHF; Pneumonia (09/01/2013); Diabetes mellitus; GERD (gastroesophageal reflux disease); OSA (obstructive sleep apnea) (11/05/2013); and Obesity hypoventilation syndrome (11/05/2013).  Nancy West  has past surgical history that includes Breast biopsy (Right, 1980's); Cholecystectomy (1980's); Appendectomy (~ 2008); and Pericardiocentesis (12/2011).  Prior to Admission medications   Medication Sig Start Date End Date Taking? Authorizing Provider  aspirin EC 81 MG EC tablet Take 1 tablet (81 mg total) by mouth daily. 09/05/13  Yes Vassie Lollarlos Madera, MD  atorvastatin (LIPITOR) 40 MG tablet Take 40 mg by mouth daily.    Yes Historical Provider, MD  benazepril (LOTENSIN) 20 MG tablet Take 20 mg by mouth daily.   Yes Historical Provider, MD  budesonide-formoterol (SYMBICORT) 160-4.5 MCG/ACT inhaler Inhale 2 puffs into the lungs 2 (two) times daily. 11/23/13  Yes Vineet Sood, MD  CARTIA XT 240 MG 24 hr capsule TAKE 1 CAPSULE BY MOUTH EVERY DAY   Yes Vesta MixerPhilip J Nahser, MD  Cholecalciferol (VITAMIN D) 1000 UNITS capsule Take 1,000 Units by mouth daily.    Yes Historical Provider, MD  diltiazem (CARDIZEM CD) 240 MG 24 hr capsule Take 240 mg by mouth daily.   Yes Historical Provider, MD  furosemide (LASIX) 40 MG tablet TAKE 1 TABLET BY MOUTH TWICE DAILY   Yes Vesta MixerPhilip J Nahser, MD  levothyroxine (SYNTHROID, LEVOTHROID) 50 MCG tablet Take 1 tablet (50 mcg total) by mouth daily. 03/09/13  Yes Joselyn ArrowEve Knapp, MD  metoprolol tartrate (LOPRESSOR) 25 MG tablet TAKE 1 TABLET BY MOUTH TWICE DAILY 10/16/13  Yes Vesta MixerPhilip J Nahser, MD  nitroGLYCERIN (NITROSTAT) 0.4 MG SL tablet Place 1 tablet (0.4 mg total) under the tongue every 5 (five) minutes as needed for chest pain (up to 3 doses). 12/19/11 12/21/13 Yes Jessica A Hope, PA-C  omeprazole (PRILOSEC) 20 MG capsule Take 1 capsule (20 mg total) by mouth 2 (two) times daily. 01/21/12  Yes Rosalio MacadamiaLori C Gerhardt, NP  potassium chloride (K-DUR,KLOR-CON) 10 MEQ tablet Take 10 mEq by mouth daily.   Yes Historical Provider, MD    Allergies  Allergen Reactions  . Augmentin [Amoxicillin-Pot Clavulanate]     Abdominal  pain     Physical Exam:  General - No distress ENT - No sinus tenderness, no oral exudate, no LAN Cardiac - s1s2 regular, no murmur Chest - No wheeze/rales/dullness Back - No focal tenderness Abd - Soft, non-tender Ext - No edema Neuro - Normal strength Skin - No rashes Psych - normal mood, and behavior   Assessment/Plan:  Coralyn Helling, MD [facility] Pager:  [phone]

## 2013-12-23 ENCOUNTER — Telehealth: Payer: Self-pay | Admitting: Pulmonary Disease

## 2013-12-23 DIAGNOSIS — G4733 Obstructive sleep apnea (adult) (pediatric): Secondary | ICD-10-CM

## 2013-12-23 DIAGNOSIS — G4734 Idiopathic sleep related nonobstructive alveolar hypoventilation: Secondary | ICD-10-CM

## 2013-12-23 DIAGNOSIS — J410 Simple chronic bronchitis: Secondary | ICD-10-CM

## 2013-12-23 NOTE — Telephone Encounter (Signed)
CPAP titration 12/16/13 >> CPAP 6 >> AHI 0.  No REM, disrupted sleep.  Will have my nurse inform pt that CPAP study reviewed.  I have sent order for her to be started on auto CPAP.  She will need to use 2 liters oxygen at night with CPAP.

## 2013-12-23 NOTE — Sleep Study (Signed)
[facility]  NAME: RIANNON DEGARMO DATE OF BIRTH:  [birth year] MEDICAL RECORD NUMBER [id number]  LOCATION:  [facility]  PHYSICIAN: Coralyn Helling, M.D. DATE OF STUDY: 12/16/2013  SLEEP STUDY TYPE: CPAP titration               REFERRING PHYSICIAN: Coralyn Helling, MD  INDICATION FOR STUDY:  Nancy West is a 68 y.o. female who was found to have moderate sleep apnea with AHI of 22.2 and SaO2 low of 80% on sleep study from 10/21/13.  She returns to the sleep lab for a titration study.  EPWORTH SLEEPINESS SCORE: 9. HEIGHT: 5\' 8"  (172.7 cm)  WEIGHT: 310 lb (140.615 kg)    Body mass index is 47.15 kg/(m^2).  NECK SIZE: 18.5 in.  MEDICATIONS:  Current Outpatient Prescriptions on File Prior to Visit  Medication Sig Dispense Refill  . aspirin EC 81 MG EC tablet Take 1 tablet (81 mg total) by mouth daily.      Marland Kitchen atorvastatin (LIPITOR) 40 MG tablet Take 40 mg by mouth daily.      . benazepril (LOTENSIN) 20 MG tablet Take 20 mg by mouth daily.      . budesonide-formoterol (SYMBICORT) 160-4.5 MCG/ACT inhaler Inhale 2 puffs into the lungs 2 (two) times daily.  1 Inhaler  6  . CARTIA XT 240 MG 24 hr capsule TAKE 1 CAPSULE BY MOUTH EVERY DAY  30 capsule  2  . Cholecalciferol (VITAMIN D) 1000 UNITS capsule Take 1,000 Units by mouth daily.       Marland Kitchen diltiazem (CARDIZEM CD) 240 MG 24 hr capsule Take 240 mg by mouth daily.      . furosemide (LASIX) 40 MG tablet TAKE 1 TABLET BY MOUTH TWICE DAILY  60 tablet  2  . levothyroxine (SYNTHROID, LEVOTHROID) 50 MCG tablet Take 1 tablet (50 mcg total) by mouth daily.  90 tablet  3  . metoprolol tartrate (LOPRESSOR) 25 MG tablet TAKE 1 TABLET BY MOUTH TWICE DAILY  60 tablet  5  . nitroGLYCERIN (NITROSTAT) 0.4 MG SL tablet Place 1 tablet (0.4 mg total) under the tongue every 5 (five) minutes as needed for chest pain (up to 3 doses).  25 tablet  3  . omeprazole (PRILOSEC) 20 MG capsule Take 1 capsule (20 mg total) by mouth 2  (two) times daily.      . potassium chloride (K-DUR,KLOR-CON) 10 MEQ tablet Take 10 mEq by mouth daily.       No current facility-administered medications on file prior to visit.    SLEEP ARCHITECTURE:  Total recording time: 371 minutes.  Total sleep time was: 136.5 minutes.  Sleep efficiency: 36.8%.  Sleep latency: 60.5 minutes.  REM latency: N/A minutes.  Stage N1: 13.6%.  Stage N2: 83.2%.  Stage N3: 3.3%.  Stage R:  0%.  Supine sleep: 8 minutes.  Non-supine sleep: 128.5 minutes.  CARDIAC DATA:  Average heart rate: 63 beats per minute. Rhythm strip: sinus rhythm with PVC's.  RESPIRATORY DATA: Average respiratory rate: 18. Snoring: none.  She was started on CPAP 5 and increased to 7 cm H2O.  With CPAP 6 cm H2O her AHI was 0.    MOVEMENT/PARASOMNIA:  Periodic limb movement: 18.  Period limb movements with arousals: 6.2. Restroom trips: one.  OXYGEN DATA:  Baseline oxygenation: 88%. Lowest SaO2: 86%. Time spent below SaO2 90%: 45.8 minutes. Supplemental oxygen used: 2 liters oxygen.  IMPRESSION/ RECOMMENDATION:   While she had reasonable control of her  sleep apnea with CPAP 6 cm H2O, she had disrupted sleep.  In addition she did not have REM sleep.  Therefore it is difficult to determine from this study whether should would require higher pressure settings.    She was fitted with a medium sized Fisher Paykel Simplus full face mask.  I would recommend the patient be started on auto CPAP with pressure range of 5 to 15 cm H2O with 2 liters oxygen and monitored for her clinical response.  Additional therapies include weight loss, CPAP, oral appliance, or surgical evaluation.   Coralyn HellingVineet Syanne Looney, M.D. Diplomate, Biomedical engineerAmerican Board of Sleep Medicine  ELECTRONICALLY SIGNED ON:  12/23/2013, 10:06 AM [facility] PH: [phone]   FX: [phone] ACCREDITED BY THE AMERICAN ACADEMY OF SLEEP MEDICINE

## 2013-12-29 NOTE — Telephone Encounter (Signed)
Patient has picked up equipment from DME Will update [place] on tolerance.  Nothing further needed.

## 2013-12-31 NOTE — Assessment & Plan Note (Signed)
Will call her with results of CPAP titration study.

## 2013-12-31 NOTE — Assessment & Plan Note (Signed)
Improved since last visit.  Will continue symbicort and prn albuterol.  Will arrange for referral to pulmonary rehab.

## 2013-12-31 NOTE — Assessment & Plan Note (Signed)
Encouraged her to continue with her smoking cessation efforts. 

## 2013-12-31 NOTE — Assessment & Plan Note (Signed)
Will determine whether she needs to continue with nocturnal oxygen use after review of her CPAP titration study.  Until then she is to continue 2 liters oxygen at night and as needed during the day with activity.

## 2014-01-01 ENCOUNTER — Encounter (HOSPITAL_COMMUNITY)
Admission: RE | Admit: 2014-01-01 | Discharge: 2014-01-01 | Disposition: A | Discharge status: Home / Self Care | Payer: Medicare Other | Source: Ambulatory Visit | Attending: Pulmonary Disease | Admitting: Pulmonary Disease

## 2014-01-01 ENCOUNTER — Encounter (HOSPITAL_COMMUNITY): Payer: Self-pay

## 2014-01-01 VITALS — BP 110/62 | HR 72 | Resp 20 | Ht 67.25 in | Wt 308.2 lb

## 2014-01-01 DIAGNOSIS — J438 Other emphysema: Secondary | ICD-10-CM

## 2014-01-01 DIAGNOSIS — I309 Acute pericarditis, unspecified: Secondary | ICD-10-CM | POA: Insufficient documentation

## 2014-01-01 DIAGNOSIS — R0609 Other forms of dyspnea: Secondary | ICD-10-CM | POA: Insufficient documentation

## 2014-01-01 DIAGNOSIS — J449 Chronic obstructive pulmonary disease, unspecified: Secondary | ICD-10-CM | POA: Diagnosis not present

## 2014-01-01 DIAGNOSIS — J4489 Other specified chronic obstructive pulmonary disease: Secondary | ICD-10-CM | POA: Diagnosis not present

## 2014-01-01 DIAGNOSIS — R0989 Other specified symptoms and signs involving the circulatory and respiratory systems: Principal | ICD-10-CM | POA: Insufficient documentation

## 2014-01-01 NOTE — Progress Notes (Addendum)
Nancy West 68 y.o. female Pulmonary Rehab Orientation Note Patient arrived today in [facility] for orientation to Pulmonary Rehab. She was transported from [facility] via wheel chair. She does carry portable oxygen for use PRN. Per pt, she uses oxygen continuously at night at 2 L, however she has been wearing her new CPAP for the last 3 nights. She does describe difficulty with tolerance to the mask. Encouraged patient to contact MD for other mask options if she is unable to tolerate current one. Color good, skin warm and dry but venous stasis with ruddy color noted to lower extremities. Patient also has mild pitting edema to feet and ankles. Patient is oriented to time and place. Patient's medical history and medications reviewed. Heart rate is normal, breath sounds clear to auscultation with diminished bases in the right, and expiratory wheezes to the left. Grip strength equal, strong. Distal pulses palpable. Patient reports she does take medications as prescribed. Patient states she follows a Low fat, Low Sodium diet. The patient reports no specific efforts to gain or lose weight.. Patient's weight will be monitored closely. Demonstration and practice of PLB using pulse oximeter. Patient able to return demonstration satisfactorily. Safety and hand hygiene in the exercise area reviewed with patient. Patient voices understanding of the information reviewed. Department expectations discussed with patient and achievable goals were set. The patient shows enthusiasm about attending the program and we look forward to working with this nice gentleman. The patient is scheduled for a 6 min walk test on Tuesday 7/14 at 3:30 and to begin exercise on Tuesday 7/21.     45 minutes was spent on a variety of activities such as assessment of the patient, obtaining baseline data including height, weight, BMI, and grip strength, verifying medical history, allergies, and current medications, and  teaching patient strategies for performing tasks with less respiratory effort with emphasis on pursed lip breathing.

## 2014-01-05 ENCOUNTER — Encounter (HOSPITAL_COMMUNITY)
Admission: RE | Admit: 2014-01-05 | Discharge: 2014-01-05 | Disposition: A | Discharge status: Home / Self Care | Payer: Medicare Other | Source: Ambulatory Visit | Attending: Pulmonary Disease | Admitting: Pulmonary Disease

## 2014-01-05 DIAGNOSIS — J449 Chronic obstructive pulmonary disease, unspecified: Secondary | ICD-10-CM | POA: Diagnosis not present

## 2014-01-05 DIAGNOSIS — R0609 Other forms of dyspnea: Secondary | ICD-10-CM | POA: Diagnosis not present

## 2014-01-05 DIAGNOSIS — I309 Acute pericarditis, unspecified: Secondary | ICD-10-CM | POA: Diagnosis not present

## 2014-01-05 DIAGNOSIS — R0989 Other specified symptoms and signs involving the circulatory and respiratory systems: Secondary | ICD-10-CM | POA: Diagnosis not present

## 2014-01-05 NOTE — Progress Notes (Signed)
Nancy West completed a Six-Minute Walk Test on 01/05/2014 . Nancy West walked 775 feet with 0 breaks.  The patient's lowest oxygen saturation was 91 , highest heart rate was 92 , and highest blood pressure was 156/70. The patient was on 2 liters of oxygen with a nasal cannula. Nancy West stated that nothing hindered their walk test.

## 2014-01-12 ENCOUNTER — Encounter (HOSPITAL_COMMUNITY)
Admission: RE | Admit: 2014-01-12 | Discharge: 2014-01-12 | Disposition: A | Discharge status: Home / Self Care | Payer: Medicare Other | Source: Ambulatory Visit | Attending: Pulmonary Disease | Admitting: Pulmonary Disease

## 2014-01-12 DIAGNOSIS — R0609 Other forms of dyspnea: Secondary | ICD-10-CM | POA: Diagnosis not present

## 2014-01-12 DIAGNOSIS — I309 Acute pericarditis, unspecified: Secondary | ICD-10-CM | POA: Diagnosis not present

## 2014-01-12 DIAGNOSIS — J449 Chronic obstructive pulmonary disease, unspecified: Secondary | ICD-10-CM | POA: Diagnosis not present

## 2014-01-12 DIAGNOSIS — R0989 Other specified symptoms and signs involving the circulatory and respiratory systems: Secondary | ICD-10-CM | POA: Diagnosis not present

## 2014-01-12 NOTE — Progress Notes (Signed)
Today, Nancy West exercised at [facility] [employer] [facility]. Service time was from 1330 to 1515.  The patient exercised for more than 31 minutes performing aerobic, strengthening, and stretching exercises. Oxygen saturation, heart rate, blood pressure, rate of perceived exertion, and shortness of breath were all monitored before, during, and after exercise. [name] presented with no problems at today's exercise session.   There was no workload change during today's exercise session.  Pre-exercise vitals:   Weight kg: 141.1   Liters of O2: 2L   SpO2: 94   HR: 77   BP: 118/68   CBG: na  Exercise vitals:   Highest heartrate:  94   Lowest oxygen saturation: 90   Highest blood pressure: 148/74   Liters of 02: 2L  Post-exercise vitals:   SpO2: 96   HR: 73   BP: 104/60   Liters of O2: 2L   CBG: na  Dr. Kalman Shan, Medical Director Dr. Rito Ehrlich is immediately available during today's Pulmonary Rehab session for Nancy West on 01/12/2014 at 1330 class time.

## 2014-01-14 ENCOUNTER — Encounter (HOSPITAL_COMMUNITY)
Admission: RE | Admit: 2014-01-14 | Discharge: 2014-01-14 | Disposition: A | Discharge status: Home / Self Care | Payer: Medicare Other | Source: Ambulatory Visit | Attending: Pulmonary Disease | Admitting: Pulmonary Disease

## 2014-01-14 DIAGNOSIS — I309 Acute pericarditis, unspecified: Secondary | ICD-10-CM | POA: Diagnosis not present

## 2014-01-14 DIAGNOSIS — J449 Chronic obstructive pulmonary disease, unspecified: Secondary | ICD-10-CM | POA: Diagnosis not present

## 2014-01-14 DIAGNOSIS — R0609 Other forms of dyspnea: Secondary | ICD-10-CM | POA: Diagnosis not present

## 2014-01-14 DIAGNOSIS — R0989 Other specified symptoms and signs involving the circulatory and respiratory systems: Secondary | ICD-10-CM | POA: Diagnosis not present

## 2014-01-14 NOTE — Progress Notes (Signed)
Today, Nancy West exercised at [facility] [employer] [facility]. Service time was from 1:30 to 3:30.  The patient exercised for more than 31 minutes performing aerobic, strengthening, and stretching exercises. Oxygen saturation, heart rate, blood pressure, rate of perceived exertion, and shortness of breath were all monitored before, during, and after exercise. Erikah presented with no problems at today's exercise session. The patient attended Exercise for the Pulmonary Patient today.  There was no workload change during today's exercise session.  Pre-exercise vitals:   Weight kg: 140.1   Liters of O2: 2   SpO2: 95   HR: 76   BP: 132/62   CBG: na  Exercise vitals:   Highest heartrate:  80   Lowest oxygen saturation: 94   Highest blood pressure: 120/66   Liters of 02: 2  Post-exercise vitals:   SpO2: 94   HR: 70   BP: 116/66   Liters of O2: 2   CBG: na  Dr. Kalman Shan, Medical Director Dr. Arbutus Leas is immediately available during today's Pulmonary Rehab session for Nancy West on 01/14/14 at 1:30 class time.

## 2014-01-19 ENCOUNTER — Encounter (HOSPITAL_COMMUNITY)
Admission: RE | Admit: 2014-01-19 | Discharge: 2014-01-19 | Disposition: A | Discharge status: Home / Self Care | Payer: Medicare Other | Source: Ambulatory Visit | Attending: Pulmonary Disease | Admitting: Pulmonary Disease

## 2014-01-19 DIAGNOSIS — J449 Chronic obstructive pulmonary disease, unspecified: Secondary | ICD-10-CM | POA: Diagnosis not present

## 2014-01-19 DIAGNOSIS — I309 Acute pericarditis, unspecified: Secondary | ICD-10-CM | POA: Diagnosis not present

## 2014-01-19 DIAGNOSIS — R0989 Other specified symptoms and signs involving the circulatory and respiratory systems: Secondary | ICD-10-CM | POA: Diagnosis not present

## 2014-01-19 DIAGNOSIS — R0609 Other forms of dyspnea: Secondary | ICD-10-CM | POA: Diagnosis not present

## 2014-01-19 NOTE — Progress Notes (Signed)
Today, Nancy West exercised at [facility] [facility]. Service time was from 1:30 to 3:15.  The patient exercised for more than 31 minutes performing aerobic, strengthening, and stretching exercises. Oxygen saturation, heart rate, blood pressure, rate of perceived exertion, and shortness of breath were all monitored before, during, and after exercise. Nancy West presented with no problems at today's exercise session.   There was no workload change during today's exercise session.  Pre-exercise vitals:   Weight kg: 141.3   Liters of O2: 2   SpO2: 92   HR: 75   BP: 110/68   CBG: na  Exercise vitals:   Highest heartrate:  101   Lowest oxygen saturation: 94   Highest blood pressure: 120/64   Liters of 02: 2  Post-exercise vitals:   SpO2: 95   HR: 71   BP: 104/60   Liters of O2: 2   CBG: na  Dr. Kalman Shan, Medical Director Dr. Arbutus Leas is immediately available during today's Pulmonary Rehab session for Nancy West on 01/19/14 at 1:30pm class time.

## 2014-01-20 LAB — PULMONARY FUNCTION TEST
DL/VA % pred: 90 %
DL/VA: 4.53 ml/min/mmHg/L
DLCO unc % pred: 57 %
DLCO unc: 15.13 ml/min/mmHg
FEF 25-75 Post: 0.89 L/sec
FEF 25-75 Pre: 0.82 L/sec
FEF2575-%CHANGE-POST: 9 %
FEF2575-%PRED-POST: 41 %
FEF2575-%Pred-Pre: 37 %
FEV1-%Change-Post: 1 %
FEV1-%PRED-POST: 48 %
FEV1-%Pred-Pre: 47 %
FEV1-PRE: 1.2 L
FEV1-Post: 1.22 L
FEV1FVC-%Change-Post: 0 %
FEV1FVC-%PRED-PRE: 95 %
FEV6-%Change-Post: 1 %
FEV6-%Pred-Post: 52 %
FEV6-%Pred-Pre: 51 %
FEV6-Post: 1.66 L
FEV6-Pre: 1.63 L
FEV6FVC-%Pred-Post: 104 %
FEV6FVC-%Pred-Pre: 104 %
FVC-%Change-Post: 1 %
FVC-%Pred-Post: 50 %
FVC-%Pred-Pre: 49 %
FVC-Post: 1.66 L
FVC-Pre: 1.63 L
PRE FEV6/FVC RATIO: 100 %
Post FEV1/FVC ratio: 73 %
Post FEV6/FVC ratio: 100 %
Pre FEV1/FVC ratio: 74 %
RV % pred: 103 %
RV: 2.26 L
TLC % pred: 75 %
TLC: 3.97 L

## 2014-01-21 ENCOUNTER — Encounter (HOSPITAL_COMMUNITY)
Admission: RE | Admit: 2014-01-21 | Discharge: 2014-01-21 | Disposition: A | Discharge status: Home / Self Care | Payer: Medicare Other | Source: Ambulatory Visit | Attending: Pulmonary Disease | Admitting: Pulmonary Disease

## 2014-01-21 DIAGNOSIS — I309 Acute pericarditis, unspecified: Secondary | ICD-10-CM | POA: Diagnosis not present

## 2014-01-21 DIAGNOSIS — R0989 Other specified symptoms and signs involving the circulatory and respiratory systems: Secondary | ICD-10-CM | POA: Diagnosis not present

## 2014-01-21 DIAGNOSIS — J449 Chronic obstructive pulmonary disease, unspecified: Secondary | ICD-10-CM | POA: Diagnosis not present

## 2014-01-21 DIAGNOSIS — R0609 Other forms of dyspnea: Secondary | ICD-10-CM | POA: Diagnosis not present

## 2014-01-21 NOTE — Progress Notes (Signed)
Today, Iyla exercised at [facility] [employer]. Service time was from 1330 to 1515.  The patient exercised for more than 31 minutes performing aerobic, strengthening, and stretching exercises. Oxygen saturation, heart rate, blood pressure, rate of perceived exertion, and shortness of breath were all monitored before, during, and after exercise. Deyaneira presented with no problems at today's exercise session. Harsha also attended an education session on warning signs of illness in the pulmonary patient.  There was a workload change during today's exercise session.  Pre-exercise vitals:   Weight kg: 141.9   Liters of O2: 2L   SpO2: 94   HR: 69   BP: 128/62   CBG: na  Exercise vitals:   Highest heartrate:  76   Lowest oxygen saturation: 95   Highest blood pressure: 130/60   Liters of 02: 2L  Post-exercise vitals:   SpO2: 95   HR: 67   BP: 110/62   Liters of O2: 2L   CBG: na  Dr. Kalman Shan, Medical Director Dr. Vanessa Barbara is immediately available during today's Pulmonary Rehab session for [name] on 01/21/2014 at 1330 class time.

## 2014-01-26 ENCOUNTER — Encounter (HOSPITAL_COMMUNITY)
Admission: RE | Admit: 2014-01-26 | Discharge: 2014-01-26 | Disposition: A | Discharge status: Home / Self Care | Payer: Medicare Other | Source: Ambulatory Visit | Attending: Pulmonary Disease | Admitting: Pulmonary Disease

## 2014-01-26 DIAGNOSIS — R0609 Other forms of dyspnea: Secondary | ICD-10-CM | POA: Insufficient documentation

## 2014-01-26 DIAGNOSIS — J4489 Other specified chronic obstructive pulmonary disease: Secondary | ICD-10-CM | POA: Insufficient documentation

## 2014-01-26 DIAGNOSIS — R0989 Other specified symptoms and signs involving the circulatory and respiratory systems: Principal | ICD-10-CM | POA: Insufficient documentation

## 2014-01-26 DIAGNOSIS — I309 Acute pericarditis, unspecified: Secondary | ICD-10-CM | POA: Insufficient documentation

## 2014-01-26 DIAGNOSIS — J449 Chronic obstructive pulmonary disease, unspecified: Secondary | ICD-10-CM | POA: Insufficient documentation

## 2014-01-26 NOTE — Progress Notes (Signed)
Today, Nancy West exercised at [facility] [employer]. Service time was from 1330 to 1515.  The patient exercised for more than 31 minutes performing aerobic, strengthening, and stretching exercises. Oxygen saturation, heart rate, blood pressure, rate of perceived exertion, and shortness of breath were all monitored before, during, and after exercise. Nancy West presented with no problems at today's exercise session.   There was a workload change during today's exercise session.  Pre-exercise vitals:   Weight kg: 141.1   Liters of O2: 2L   SpO2: 95   HR: 77   BP: 116/74   CBG: na  Exercise vitals:   Highest heartrate:  83   Lowest oxygen saturation: 91   Highest blood pressure: 122/60   Liters of 02: 2L  Post-exercise vitals:   SpO2: 94   HR: 73   BP: 112/64   Liters of O2: 2L   CBG: na  Dr. Kalman Shan, Medical Director Dr. Vanessa Barbara is immediately available during today's Pulmonary Rehab session for Nancy West on 01/26/2014 at 1515 class time.

## 2014-01-26 NOTE — Progress Notes (Signed)
[name] 68 y.o. female Nutrition Note Spoke with pt. Pt is obese. According to pt, she has lost "43 lb over the past several months; most of the wt loss due to fluid loss." Pt wt in EMR reviewed. Pt wt is down 33.4 lb over the past 3 months. Pt eats 3 meals plus 2 snacks a day. Pt is eating toast with butter and Cheerios or Special K with 1% milk for breakfast and a Lean Cuisine at lunch and dinner, which pt supplements with vegetables.  Pt likes Lean Cuisines because they help her with portion control, "which is a problem for me." Pt is aware of the sodium content of Lean Cuisine's "being too high." Pt states she lost 190 lb while on Nutrisystem, but gained about 90 lb back. According to pt's Rate Your Plate results, the pt is making healthy food choices the majority of the time.  Pt's Rate Your Plate results reviewed with pt. Pt is a diet-controlled diabetic. Last A1c indicates blood glucose well-controlled. Pt does not check CBG frequently. Pt expressed understanding of the information reviewed. Vitals - 1 value per visit 01/01/2014 12/21/2013 12/16/2013 11/05/2013  Weight (lb) 308.2 307 310 341   Vitals - 1 value per visit 10/20/2013 09/15/2013 09/14/2013 09/05/2013  Weight (lb) 320 326.4 323 323.6   Vitals - 1 value per visit 09/01/2013 05/04/2013  Weight (lb)  308    Nutrition Diagnosis   Excessive sodium intake related to over consumption of processed food as evidenced by frequent consumption of convenience food.   Food-and nutrition-related knowledge deficit related to lack of exposure to information as related to diagnosis of pulmonary disease   Obesity related to excessive energy intake as evidenced by a BMI of 47.9 Nutrition Rx/Est. Daily Nutrition Needs for: ? wt loss 1800-2300 Kcal  115-140 gm protein   1500 mg or less sodium     250 gm CHO Nutrition Intervention   Pt's individual nutrition plan and goals reviewed with pt.   Benefits of adopting healthy eating habits discussed when  pt's Rate Your Plate reviewed.   Pt to attend the Nutrition and Lung Disease class   Pt encouraged to change breakfast to include protein (e.g. Egg whites on whole grain English muffin, Greek yogurt with berries and walnuts, or peanut butter toast with a 1/2 of a banana).   Continual client-centered nutrition education by RD, as part of interdisciplinary care. Goal(s) 1. Identify food quantities necessary to achieve wt loss of  -2# per week to a goal wt of 128.9-137.1 kg (284-302 lb) at graduation from pulmonary rehab. Monitor and Evaluate progress toward nutrition goal with team.   Mickle Plumb, M.Ed, RD, LDN, CDE 01/26/2014 2:24 PM

## 2014-01-28 ENCOUNTER — Encounter (HOSPITAL_COMMUNITY): Payer: Medicare Other

## 2014-02-02 ENCOUNTER — Encounter (HOSPITAL_COMMUNITY)
Admission: RE | Admit: 2014-02-02 | Discharge: 2014-02-02 | Disposition: A | Discharge status: Home / Self Care | Payer: Medicare Other | Source: Ambulatory Visit | Attending: Pulmonary Disease | Admitting: Pulmonary Disease

## 2014-02-02 DIAGNOSIS — R0609 Other forms of dyspnea: Secondary | ICD-10-CM | POA: Diagnosis not present

## 2014-02-02 DIAGNOSIS — R0989 Other specified symptoms and signs involving the circulatory and respiratory systems: Secondary | ICD-10-CM | POA: Diagnosis not present

## 2014-02-02 NOTE — Progress Notes (Signed)
Today, Nancy West exercised at [facility] [employer] [facility]. Service time was from 1330 to 1515.  The patient exercised for more than 31 minutes performing aerobic, strengthening, and stretching exercises. Oxygen saturation, heart rate, blood pressure, rate of perceived exertion, and shortness of breath were all monitored before, during, and after exercise. Nancy West presented with no problems at today's exercise session.   There was no workload change during today's exercise session.  Pre-exercise vitals:   Weight kg: 60.7   Liters of O2: 2L   SpO2: 94   HR: 91   BP: 108/72   CBG: na  Exercise vitals:   Highest heartrate:  111   Lowest oxygen saturation: 91   Highest blood pressure: 142/80   Liters of 02: 2L  Post-exercise vitals:   SpO2: 96   HR: 99   BP: 126/68   Liters of O2: 2L   CBG: na  Dr. Kalman Shan, Medical Director Dr. Carmell Austria is immediately available during today's Pulmonary Rehab session for Nancy West on 02/02/2014 at 1330 class time.

## 2014-02-02 NOTE — Progress Notes (Signed)
Today, Nancy West exercised at [facility] [employer] [facility]. Service time was from 1330 to 1515.  The patient exercised for more than 31 minutes performing aerobic, strengthening, and stretching exercises. Oxygen saturation, heart rate, blood pressure, rate of perceived exertion, and shortness of breath were all monitored before, during, and after exercise. Nancy West presented with no problems at today's exercise session.   There was no workload change during today's exercise session.  Pre-exercise vitals:   Weight kg: 141.8   Liters of O2: 2L   SpO2: 93   HR: 72   BP: 112/60   CBG: na  Exercise vitals:   Highest heartrate:  79   Lowest oxygen saturation: 93   Highest blood pressure: 124/72   Liters of 02: 2L  Post-exercise vitals:   SpO2: 95   HR: 67   BP: 118/64   Liters of O2: 2L   CBG: na  Dr. Kalman Shan, Medical Director Dr. Carmell Austria is immediately available during today's Pulmonary Rehab session for [name] on 02/02/2014 at 1330 class time.

## 2014-02-04 ENCOUNTER — Encounter (HOSPITAL_COMMUNITY)
Admission: RE | Admit: 2014-02-04 | Discharge: 2014-02-04 | Disposition: A | Discharge status: Home / Self Care | Payer: Medicare Other | Source: Ambulatory Visit | Attending: Pulmonary Disease | Admitting: Pulmonary Disease

## 2014-02-04 DIAGNOSIS — R0989 Other specified symptoms and signs involving the circulatory and respiratory systems: Secondary | ICD-10-CM | POA: Diagnosis not present

## 2014-02-04 DIAGNOSIS — R0609 Other forms of dyspnea: Secondary | ICD-10-CM | POA: Diagnosis not present

## 2014-02-04 NOTE — Progress Notes (Signed)
Today, Nancy West exercised at [facility]. [facility]. Service time was from 1330 to 1530.  The patient exercised for more than 31 minutes performing aerobic, strengthening, and stretching exercises. Oxygen saturation, heart rate, blood pressure, rate of perceived exertion, and shortness of breath were all monitored before, during, and after exercise. Nancy West presented with no problems at today's exercise session. Patient attended pulmonary medication class.   There was one workload change during today's exercise session.  Pre-exercise vitals:   Weight kg: 141.9   Liters of O2: 2   SpO2: 92   HR: 71   BP: 124/60   CBG: NA  Exercise vitals:   Highest heartrate:  80   Lowest oxygen saturation: 93   Highest blood pressure: 140/70   Liters of 02: 2  Post-exercise vitals:   SpO2: 96   HR: 59   BP: 112/62   Liters of O2: 2   CBG: na Dr. Kalman Shan, Medical Director Dr. Rito Ehrlich is immediately available during today's [facility] session for Nancy West on 02/04/2014 at 1330 class time.

## 2014-02-09 ENCOUNTER — Encounter (HOSPITAL_COMMUNITY)
Admission: RE | Admit: 2014-02-09 | Discharge: 2014-02-09 | Disposition: A | Discharge status: Home / Self Care | Payer: Medicare Other | Source: Ambulatory Visit | Attending: Pulmonary Disease | Admitting: Pulmonary Disease

## 2014-02-09 DIAGNOSIS — R0989 Other specified symptoms and signs involving the circulatory and respiratory systems: Secondary | ICD-10-CM | POA: Diagnosis not present

## 2014-02-09 DIAGNOSIS — R0609 Other forms of dyspnea: Secondary | ICD-10-CM | POA: Diagnosis not present

## 2014-02-09 NOTE — Progress Notes (Signed)
Today, Sheenah exercised at [facility] [employer] [facility]. Service time was from 1330 to 1500.  The patient exercised for more than 31 minutes performing aerobic, strengthening, and stretching exercises. Oxygen saturation, heart rate, blood pressure, rate of perceived exertion, and shortness of breath were all monitored before, during, and after exercise. Nancy West presented with no problems at today's exercise session.   There was a workload change during today's exercise session.  Pre-exercise vitals:   Weight kg: 141.9   Liters of O2: 2L   SpO2: 97   HR: 71   BP: 124/72   CBG: na  Exercise vitals:   Highest heartrate:  83   Lowest oxygen saturation: 93   Highest blood pressure: 122/80   Liters of 02: 2L  Post-exercise vitals:   SpO2: 93   HR: 71   BP: 126/64   Liters of O2: 2L   CBG: na  Dr. Kalman Shan, Medical Director Dr. Rito Ehrlich is immediately available during today's Pulmonary Rehab session for Nancy West on 02/09/2014 at 1330 class time.

## 2014-02-09 NOTE — Progress Notes (Signed)
I have reviewed a Home Exercise Prescription with Nancy West . Nancy West is currently exercising at home.  The patient was advised to walk 20 minutes and utilize her elliptical bike 20 minutes 2-3 times a week.  Nancy West and I discussed how to progress their exercise prescription.  The patient stated that their goals were to increase stability and be able to walk more.  The patient stated that they understand the exercise prescription.  We reviewed exercise guidelines, target heart rate during exercise, oxygen use, weather, home pulse oximeter, endpoints for exercise, and goals.  Patient is encouraged to come to me with any questions. I will continue to follow up with the patient to assist them with progression and safety.

## 2014-02-11 ENCOUNTER — Encounter (HOSPITAL_COMMUNITY): Admission: RE | Admit: 2014-02-11 | Payer: Medicare Other | Source: Ambulatory Visit

## 2014-02-16 ENCOUNTER — Encounter (HOSPITAL_COMMUNITY)
Admission: RE | Admit: 2014-02-16 | Discharge: 2014-02-16 | Disposition: A | Discharge status: Home / Self Care | Payer: Medicare Other | Source: Ambulatory Visit | Attending: Pulmonary Disease | Admitting: Pulmonary Disease

## 2014-02-16 DIAGNOSIS — R0989 Other specified symptoms and signs involving the circulatory and respiratory systems: Secondary | ICD-10-CM | POA: Diagnosis not present

## 2014-02-16 DIAGNOSIS — R0609 Other forms of dyspnea: Secondary | ICD-10-CM | POA: Diagnosis not present

## 2014-02-16 NOTE — Progress Notes (Signed)
Today, [name] exercised at [facility]. Service time was from 1330 to 1515.  The patient exercised for more than 31 minutes performing aerobic, strengthening, and stretching exercises. Oxygen saturation, heart rate, blood pressure, rate of perceived exertion, and shortness of breath were all monitored before, during, and after exercise. Nancy West presented with no problems at today's exercise session.   There was no workload change during today's exercise session.  Pre-exercise vitals:   Weight kg: 142.6   Liters of O2: 2   SpO2: 94   HR: 68   BP: 120/60   CBG: NA  Exercise vitals:   Highest heartrate:  96   Lowest oxygen saturation: 93   Highest blood pressure: 122/80   Liters of 02: 2  Post-exercise vitals:   SpO2: 94   HR: 76   BP: 124/60   Liters of O2: 2   CBG: NA Dr. Kalman Shan, Medical Director Dr. Waymon Amato is immediately available during today's Pulmonary Rehab session for Nancy West on 02/16/2014 at 1330 class time.

## 2014-02-18 ENCOUNTER — Encounter (HOSPITAL_COMMUNITY): Admission: RE | Admit: 2014-02-18 | Payer: Medicare Other | Source: Ambulatory Visit

## 2014-02-23 ENCOUNTER — Encounter (HOSPITAL_COMMUNITY)
Admission: RE | Admit: 2014-02-23 | Discharge: 2014-02-23 | Disposition: A | Discharge status: Home / Self Care | Payer: Medicare Other | Source: Ambulatory Visit | Attending: Pulmonary Disease | Admitting: Pulmonary Disease

## 2014-02-23 DIAGNOSIS — R0989 Other specified symptoms and signs involving the circulatory and respiratory systems: Secondary | ICD-10-CM | POA: Diagnosis present

## 2014-02-23 DIAGNOSIS — J4489 Other specified chronic obstructive pulmonary disease: Secondary | ICD-10-CM | POA: Insufficient documentation

## 2014-02-23 DIAGNOSIS — I309 Acute pericarditis, unspecified: Secondary | ICD-10-CM | POA: Diagnosis not present

## 2014-02-23 DIAGNOSIS — J449 Chronic obstructive pulmonary disease, unspecified: Secondary | ICD-10-CM | POA: Diagnosis not present

## 2014-02-23 DIAGNOSIS — R0609 Other forms of dyspnea: Secondary | ICD-10-CM | POA: Insufficient documentation

## 2014-02-23 NOTE — Progress Notes (Signed)
Today, Nancy West exercised at [facility]. Service time was from 1330 to 1515.  The patient exercised for more than 31 minutes performing aerobic, strengthening, and stretching exercises. Oxygen saturation, heart rate, blood pressure, rate of perceived exertion, and shortness of breath were all monitored before, during, and after exercise. [name] presented with no problems at today's exercise session.   There was no workload change during today's exercise session.  Pre-exercise vitals:   Weight kg: 142.4   Liters of O2: 2L   SpO2: 95   HR: 75   BP: 120/60   CBG: na  Exercise vitals:   Highest heartrate:  83   Lowest oxygen saturation: 93   Highest blood pressure: 118/76   Liters of 02: 2L  Post-exercise vitals:   SpO2: 96   HR: 71   BP: 110/60   Liters of O2: 2L   CBG: na  Dr. Kalman Shan, Medical Director Dr. Jomarie Longs is immediately available during today's Pulmonary Rehab session for Nancy West on 02/23/2014 at 1330 class time.

## 2014-02-25 ENCOUNTER — Other Ambulatory Visit (HOSPITAL_COMMUNITY): Payer: Self-pay | Admitting: Cardiovascular Disease

## 2014-02-25 ENCOUNTER — Encounter (HOSPITAL_COMMUNITY): Admission: RE | Admit: 2014-02-25 | Payer: Medicare Other | Source: Ambulatory Visit

## 2014-03-02 ENCOUNTER — Encounter (HOSPITAL_COMMUNITY)
Admission: RE | Admit: 2014-03-02 | Discharge: 2014-03-02 | Disposition: A | Discharge status: Home / Self Care | Payer: Medicare Other | Source: Ambulatory Visit | Attending: Pulmonary Disease | Admitting: Pulmonary Disease

## 2014-03-02 DIAGNOSIS — R0609 Other forms of dyspnea: Secondary | ICD-10-CM | POA: Diagnosis not present

## 2014-03-02 NOTE — Progress Notes (Signed)
Today, [name] exercised at [facility]. Service time was from 1330 to 1500.  The patient exercised for more than 31 minutes performing aerobic, strengthening, and stretching exercises. Oxygen saturation, heart rate, blood pressure, rate of perceived exertion, and shortness of breath were all monitored before, during, and after exercise. Tresha presented with no problems at today's exercise session.   There was a workload change during today's exercise session.  Pre-exercise vitals:   Weight kg: 143.2   Liters of O2: 2L   SpO2: 95   HR: 64   BP: 142/78   CBG: na  Exercise vitals:   Highest heartrate:  80   Lowest oxygen saturation: 92   Highest blood pressure:  124/70   Liters of 02: 2L  Post-exercise vitals:   SpO2: 96   HR: 70   BP: 110/68   Liters of O2: 2L   CBG: na  Dr. Kalman Shan, Medical Director Dr. Cena Benton is immediately available during today's Pulmonary Rehab session for Nancy West on 03/02/2014 at 1330 class time.

## 2014-03-04 ENCOUNTER — Encounter (HOSPITAL_COMMUNITY): Payer: Medicare Other

## 2014-03-09 ENCOUNTER — Encounter (HOSPITAL_COMMUNITY)
Admission: RE | Admit: 2014-03-09 | Discharge: 2014-03-09 | Disposition: A | Discharge status: Home / Self Care | Payer: Medicare Other | Source: Ambulatory Visit | Attending: Pulmonary Disease | Admitting: Pulmonary Disease

## 2014-03-09 DIAGNOSIS — R0989 Other specified symptoms and signs involving the circulatory and respiratory systems: Secondary | ICD-10-CM | POA: Diagnosis not present

## 2014-03-09 DIAGNOSIS — R0609 Other forms of dyspnea: Secondary | ICD-10-CM | POA: Diagnosis not present

## 2014-03-09 NOTE — Progress Notes (Signed)
Today, [name] exercised at [facility]. Service time was from 1330 to 1500.  The patient exercised for more than 31 minutes performing aerobic, strengthening, and stretching exercises. Oxygen saturation, heart rate, blood pressure, rate of perceived exertion, and shortness of breath were all monitored before, during, and after exercise. [name] presented with no problems at today's exercise session.   There was no workload change during today's exercise session.  Pre-exercise vitals:   Weight kg: 142.9   Liters of O2: 2L   SpO2: 92   HR: 70   BP: 104/60   CBG: na  Exercise vitals:   Highest heartrate:  83   Lowest oxygen saturation: 92   Highest blood pressure: 128/72   Liters of 02: 2L  Post-exercise vitals:   SpO2: 93   HR: 67   BP: 112/70   Liters of O2: 2L   CBG: na  Dr. Kalman Shan, Medical Director Dr. Vanessa Barbara is immediately available during today's Pulmonary Rehab session for [name] on 03/09/2014 at 1330 class time.

## 2014-03-11 ENCOUNTER — Encounter (HOSPITAL_COMMUNITY)
Admission: RE | Admit: 2014-03-11 | Discharge: 2014-03-11 | Disposition: A | Discharge status: Home / Self Care | Payer: Medicare Other | Source: Ambulatory Visit | Attending: Pulmonary Disease | Admitting: Pulmonary Disease

## 2014-03-11 DIAGNOSIS — R0609 Other forms of dyspnea: Secondary | ICD-10-CM | POA: Diagnosis not present

## 2014-03-11 NOTE — Progress Notes (Signed)
Today, Nancy West exercised at [facility]. Service time was from 1330 to 1530.  The patient exercised for more than 31 minutes performing aerobic, strengthening, and stretching exercises. Oxygen saturation, heart rate, blood pressure, rate of perceived exertion, and shortness of breath were all monitored before, during, and after exercise. Nancy West presented with no problems at today's exercise session. Nancy West also attended an education session on exercise for the pulmonary patient.  There was a workload change during today's exercise session.  Pre-exercise vitals:   Weight kg: 141.1   Liters of O2: 2L   SpO2: 94   HR: 70   BP: 124/60   CBG: na  Exercise vitals:   Highest heartrate:  90   Lowest oxygen saturation: 94   Highest blood pressure: 156/90   Liters of 02: 2L  Post-exercise vitals:   SpO2: 95   HR: 59   BP: 108/62   Liters of O2: 2L   CBG: na  Dr. Kalman Shan, Medical Director Dr. Rhona Leavens is immediately available during today's Pulmonary Rehab session for Nancy West on 03/11/2014 at 1330 class time.

## 2014-03-12 ENCOUNTER — Ambulatory Visit (INDEPENDENT_AMBULATORY_CARE_PROVIDER_SITE_OTHER): Payer: Medicare Other | Admitting: Pulmonary Disease

## 2014-03-12 ENCOUNTER — Encounter: Payer: Self-pay | Admitting: Pulmonary Disease

## 2014-03-12 VITALS — BP 152/88 | HR 67 | Ht 68.0 in | Wt 311.2 lb

## 2014-03-12 DIAGNOSIS — G4733 Obstructive sleep apnea (adult) (pediatric): Secondary | ICD-10-CM

## 2014-03-12 DIAGNOSIS — F17200 Nicotine dependence, unspecified, uncomplicated: Secondary | ICD-10-CM | POA: Diagnosis not present

## 2014-03-12 DIAGNOSIS — J4489 Other specified chronic obstructive pulmonary disease: Secondary | ICD-10-CM

## 2014-03-12 DIAGNOSIS — J449 Chronic obstructive pulmonary disease, unspecified: Secondary | ICD-10-CM

## 2014-03-12 DIAGNOSIS — Z9989 Dependence on other enabling machines and devices: Principal | ICD-10-CM

## 2014-03-12 DIAGNOSIS — Z23 Encounter for immunization: Secondary | ICD-10-CM

## 2014-03-12 DIAGNOSIS — E662 Morbid (severe) obesity with alveolar hypoventilation: Secondary | ICD-10-CM | POA: Diagnosis not present

## 2014-03-12 MED ORDER — ALBUTEROL SULFATE 108 (90 BASE) MCG/ACT IN AEPB: 2.0000 | INHALATION_SPRAY | Freq: Four times a day (QID) | RESPIRATORY_TRACT | Status: AC | PRN

## 2014-03-12 NOTE — Patient Instructions (Signed)
Flu shot today Proair respiclick two puffs up to 4 times per day as needed Will arrange for CPAP mask refit Follow up in 6 months

## 2014-03-12 NOTE — Progress Notes (Signed)
Chief Complaint  Patient presents with  . Follow-up    Pt reports no change since last OV. Pt concerned that CPAP is not making a difference.     History of Present Illness: LANNIE AASE is a 68 y.o. female smoker with dyspnea.  She has hx of OSA/OHS on CPAP and home oxygen, diastolic heart failure and COPD.  Her breathing has improved.  She still gets winded with exercise, but feels like she is able to do more.  She has been working with pulmonary rehab, and this has helped.    She has a full face mask for her CPAP >> her mask doesn't seem to fit correctly.  She has been getting more used to sleeping with CPAP.  She is not having much cough or sputum.  She denies leg swelling.  TESTS: CT chest 09/01/13 >> patchy ATX  Echo 09/02/13 >> EF 55 to 65%, grade 2 diastolic dysfx, mod LA dilation, mod/severe RV dilation  PSG 10/21/13 >> AHI 22.2, SpO2 low 80%  RA SaO2 at rest 11/05/13 >> 81%; start 2 liters oxygen 24/7 PFT 12/21/13 >> FEV1 1.22 (48%) FEV1% 73, TLC 3.97 (75%), DLCO 57%, no BD CPAP titration 12/16/13 >> CPAP 6 >> AHI 0. No REM, disrupted sleep.  PMHx, PSHx, Medications, Allergies, Fhx, Shx reviewed.  Physical Exam:  General - No distress ENT - No sinus tenderness, no oral exudate, no LAN Cardiac - s1s2 regular, no murmur Chest - No wheeze/rales/dullness Back - No focal tenderness Abd - Soft, non-tender Ext - No edema Neuro - Normal strength Skin - No rashes Psych - normal mood, and behavior   Assessment/Plan:  Coralyn Helling, MD [facility] Pager:  [phone]

## 2014-03-14 ENCOUNTER — Other Ambulatory Visit: Payer: Self-pay | Admitting: Cardiovascular Disease

## 2014-03-15 ENCOUNTER — Encounter: Payer: Self-pay | Admitting: Cardiovascular Disease

## 2014-03-15 ENCOUNTER — Ambulatory Visit (INDEPENDENT_AMBULATORY_CARE_PROVIDER_SITE_OTHER): Payer: Medicare Other | Admitting: Cardiovascular Disease

## 2014-03-15 ENCOUNTER — Other Ambulatory Visit: Payer: Self-pay

## 2014-03-15 VITALS — BP 136/82 | HR 64 | Ht 68.0 in | Wt 312.0 lb

## 2014-03-15 DIAGNOSIS — I5032 Chronic diastolic (congestive) heart failure: Secondary | ICD-10-CM | POA: Diagnosis not present

## 2014-03-15 NOTE — Patient Instructions (Addendum)
Your physician wants you to follow-up in: 1 year with Dr. Elease Hashimoto.  You will receive a reminder letter in the mail two months in advance. If you don't receive a letter, please call our office to schedule the follow-up appointment.  Call Eligha Bridegroom, RN at [phone] to report whether you are taking Cardizem or [place]

## 2014-03-15 NOTE — Progress Notes (Signed)
Nancy West  Date of Birth  18-Aug-1945   [address]     [address] [place], [place]  [address] [phone]  Fax  [phone]  Problem List: 1. PVCs 2. Pericardial effusion  3. Atrial Fibrillation 4. Hypothyroidism 5. Hyperlipidemia 6. Obstuctive sleep apnea 7. COPD  History of Present Illness:  Nancy West is a 68 year old female who is a previous patient of Dr. Reyes Ivan. She has a history of morbid obesity, diastolic dysfunction, hypertension, and cigarette smoking. She presents today for followup of her palpitations. She was hospitalized earlier this summer with pericardial effusion and had a pericardial window.  She has a history of hypertension. Blood pressure has been fairly good and her home meds.   It is a bit elevated today. She has white coat hypertension. She denies any chest pain or shortness breath.  She's doing water aerobics on occasion. 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 Unfortunately, she continues to smoke. We placed an event monitor on her during her last visit. She was found to have numerous premature ventricular contractions. There was no evidence of atrial fibrillation.  Nov. 10, 2014:  Nancy West is doing well.  She is starting exercising again.  On the treadmill.  She had a pericardial effusion  In June 2013 - s/p pericardial tap   September 15, 2013:  She was recently in the hosptial for Acute on chronic disastolic CHF.         Her Lasix was doubled.  She feels very fatigued and she still has some dyspnea.   She has not smoked for the past several days - but did not commit that she had really stopped.    The echo showed mod-severe RV dilitation and RA dilitation.  No significant tricuspid regurgitation so we cannot determine the estimated PA pressure. She was seen by her medical doctor yesterday. A sleep study has been ordered.   She had a normal stress myoview in 2013: Overall Impression: Normal stress nuclear study.  LV Ejection  Fraction: 56%. LV Wall Motion: NL LV Function; NL Wall Motion  12/25/2011  Sept. 21, 2015:  Nancy West is doing better.  Has stopped smoking Was diagnosed with OSA and is now on CPAP at night. Also has COPD and hears O2 full time.   Current Outpatient Prescriptions on File Prior to Visit  Medication Sig Dispense Refill  . Albuterol Sulfate (PROAIR RESPICLICK) 108 (90 BASE) MCG/ACT AEPB Inhale 2 puffs into the lungs every 6 (six) hours as needed.  1 each  5  . aspirin EC 81 MG EC tablet Take 1 tablet (81 mg total) by mouth daily.      Marland Kitchen atorvastatin (LIPITOR) 40 MG tablet Take 40 mg by mouth daily.      . benazepril (LOTENSIN) 20 MG tablet Take 20 mg by mouth daily.      . budesonide-formoterol (SYMBICORT) 160-4.5 MCG/ACT inhaler Inhale 2 puffs into the lungs 2 (two) times daily.  1 Inhaler  6  . CARTIA XT 240 MG 24 hr capsule TAKE 1 CAPSULE BY MOUTH EVERY DAY  30 capsule  2  . Cholecalciferol (VITAMIN D) 1000 UNITS capsule Take 1,000 Units by mouth daily.       Marland Kitchen diltiazem (CARDIZEM CD) 240 MG 24 hr capsule Take 240 mg by mouth daily.      Marland Kitchen levothyroxine (SYNTHROID, LEVOTHROID) 50 MCG tablet Take 1 tablet (50 mcg total) by mouth daily.  90 tablet  3  . metoprolol tartrate (LOPRESSOR) 25  MG tablet TAKE 1 TABLET BY MOUTH TWICE DAILY  60 tablet  5  . omeprazole (PRILOSEC) 20 MG capsule Take 1 capsule (20 mg total) by mouth 2 (two) times daily.      . potassium chloride (K-DUR,KLOR-CON) 10 MEQ tablet Take 10 mEq by mouth daily.      . nitroGLYCERIN (NITROSTAT) 0.4 MG SL tablet Place 1 tablet (0.4 mg total) under the tongue every 5 (five) minutes as needed for chest pain (up to 3 doses).  25 tablet  3   No current facility-administered medications on file prior to visit.    Allergies  Allergen Reactions  . Augmentin [Amoxicillin-Pot Clavulanate]     Abdominal pain    Past Medical History  Diagnosis Date  . Obesity   . Hypertension   . Hyperlipidemia   . Hypothyroidism   . Venous stasis  of lower extremity     a. chronic LEE  . Tobacco dependence     a. 46 pack yr hx.  . Chest pain     a. 12/2011 Neg Lexi MV, EF 56%.    . Pericardial effusion     a. Large, s/p pericardiocentesis 01/02/12 yielding fluid (neg malignant cells, only inflammatory cells)  . DOE (dyspnea on exertion)     Chronic  . Anemia     Noted 12/2011  . Atrial fib/flutter, transient     In setting of pericardial effusion 12/2011  . Diastolic CHF   . Pneumonia 09/01/2013    "for the first time" (09/01/2013)  . Diabetes mellitus     "diet controlled; never been on RX; don't check sugar @ home" (09/01/2013)  . GERD (gastroesophageal reflux disease)   . OSA (obstructive sleep apnea) 11/05/2013  . Obesity hypoventilation syndrome 11/05/2013    Past Surgical History  Procedure Laterality Date  . Breast biopsy Right 1980's    a. Benign   . Cholecystectomy  1980's  . Appendectomy  ~ 2008  . Pericardiocentesis  12/2011    yielding fluid (neg malignant cells, only inflammatory cells)/notes 12/30/2011    History  Smoking status  . Current Every Day Smoker -- 0.25 packs/day for 50 years  . Types: Cigarettes  Smokeless tobacco  . Never Used    Comment: Smokes 5 cigaretts daily    History  Alcohol Use No    Comment: Pt now states she is no longer drinking alcohol    Family History  Problem Relation Age of Onset  . Cancer Mother     breast cancer (70), colon cancer  . Hypertension Mother   . Arthritis Mother   . Anxiety disorder Mother   . Diabetes Father     died in MVA    Reviw of Systems:  Reviewed in the HPI.  All other systems are negative.  Physical Exam: BP 136/82  Pulse 64  Ht  (1.727 m)  Wt 312 lb (141.522 kg)  BMI 47.45 kg/m2 The patient is alert and oriented x 3.  The mood and affect are normal.   Skin: warm and dry.  Color is normal.    HEENT:   the sclera are nonicteric.  The mucous membranes are moist.  The carotids are 2+ without bruits.  There is no  thyromegaly.  There is no JVD.    Lungs: Slight wheezing bilaterally  Heart: regular rate with a normal S1 and S2.  There are no murmurs, gallops, or rubs. The PMI is not displaced.     Abdomen: She  is morbidly obese. good bowel sounds.  There is no guarding or rebound.  There is no hepatosplenomegaly or tenderness.  There are no masses.   Extremities:  1+ edema but bilaterally.  She has chronic stasis changes.     Neuro:  Cranial nerves II - XII are intact.  Motor and sensory functions are intact.    The gait is normal.  ECG:  Assessment / Plan:

## 2014-03-15 NOTE — Assessment & Plan Note (Signed)
Nancy West has been found to have COPD, OSA.  She has stopped smoking and is feeling much better. She has lost weight   Overall, she is doing well.   Continue current meds.  I;ll see her in 1 year.

## 2014-03-16 ENCOUNTER — Encounter (HOSPITAL_COMMUNITY)
Admission: RE | Admit: 2014-03-16 | Discharge: 2014-03-16 | Disposition: A | Discharge status: Home / Self Care | Payer: Medicare Other | Source: Ambulatory Visit | Attending: Pulmonary Disease | Admitting: Pulmonary Disease

## 2014-03-16 ENCOUNTER — Telehealth: Payer: Self-pay | Admitting: Cardiovascular Disease

## 2014-03-16 DIAGNOSIS — R0989 Other specified symptoms and signs involving the circulatory and respiratory systems: Secondary | ICD-10-CM | POA: Diagnosis not present

## 2014-03-16 DIAGNOSIS — R0609 Other forms of dyspnea: Secondary | ICD-10-CM | POA: Diagnosis not present

## 2014-03-16 NOTE — Assessment & Plan Note (Signed)
Continue 2 liters oxygen at night with CPAP. 

## 2014-03-16 NOTE — Progress Notes (Signed)
Today, [name] exercised at [facility] [employer] [facility]. Service time was from 1330 to 1500.  The patient exercised for more than 31 minutes performing aerobic, strengthening, and stretching exercises. Oxygen saturation, heart rate, blood pressure, rate of perceived exertion, and shortness of breath were all monitored before, during, and after exercise. [name] presented with no problems at today's exercise session.   There was no workload change during today's exercise session.  Pre-exercise vitals:   Weight kg: 141.6   Liters of O2: 2L   SpO2: 97   HR: 67   BP: 124/60   CBG: na  Exercise vitals:   Highest heartrate:  96   Lowest oxygen saturation: 92   Highest blood pressure: 144/62   Liters of 02: 2L  Post-exercise vitals:   SpO2: 95   HR: 75   BP: 122/62   Liters of O2: 2L   CBG: na  Dr. Kalman Shan, Medical Director Dr. Rhona Leavens is immediately available during today's Pulmonary Rehab session for Nancy West on 03/16/2014 at 1330 class time.

## 2014-03-16 NOTE — Assessment & Plan Note (Signed)
She is to continue with CPAP.  Will have her DME adjust her mask fit.  She is otherwise compliant with CPAP and reports benefit.

## 2014-03-16 NOTE — Telephone Encounter (Signed)
New message    Patient was seen in the office on yesterday was told to call back with additional info .   Currently taken  -cortia XT

## 2014-03-16 NOTE — Telephone Encounter (Signed)
Medication list updated.

## 2014-03-16 NOTE — Assessment & Plan Note (Signed)
Stable.  Continue symbicort.  Will have her use proair prn.  She is to continue with pulmonary rehab.  Flu shot today.

## 2014-03-16 NOTE — Assessment & Plan Note (Signed)
Encouraged her to continue with her smoking cessation efforts. 

## 2014-03-18 ENCOUNTER — Ambulatory Visit (INDEPENDENT_AMBULATORY_CARE_PROVIDER_SITE_OTHER): Payer: Medicare Other | Admitting: Family Medicine

## 2014-03-18 ENCOUNTER — Encounter (HOSPITAL_COMMUNITY): Payer: Medicare Other

## 2014-03-18 ENCOUNTER — Encounter: Payer: Self-pay | Admitting: Family Medicine

## 2014-03-18 VITALS — BP 140/78 | HR 80 | Ht 68.0 in | Wt 307.0 lb

## 2014-03-18 DIAGNOSIS — E039 Hypothyroidism, unspecified: Secondary | ICD-10-CM | POA: Diagnosis not present

## 2014-03-18 DIAGNOSIS — E7800 Pure hypercholesterolemia, unspecified: Secondary | ICD-10-CM

## 2014-03-18 DIAGNOSIS — Z23 Encounter for immunization: Secondary | ICD-10-CM

## 2014-03-18 DIAGNOSIS — E119 Type 2 diabetes mellitus without complications: Secondary | ICD-10-CM | POA: Diagnosis not present

## 2014-03-18 DIAGNOSIS — I10 Essential (primary) hypertension: Secondary | ICD-10-CM

## 2014-03-18 DIAGNOSIS — J449 Chronic obstructive pulmonary disease, unspecified: Secondary | ICD-10-CM

## 2014-03-18 LAB — COMPREHENSIVE METABOLIC PANEL
ALT: 13 U/L (ref 0–35)
AST: 16 U/L (ref 0–37)
Albumin: 4 g/dL (ref 3.5–5.2)
Alkaline Phosphatase: 92 U/L (ref 39–117)
BUN: 31 mg/dL — AB (ref 6–23)
CALCIUM: 9.3 mg/dL (ref 8.4–10.5)
CO2: 28 meq/L (ref 19–32)
Chloride: 103 mEq/L (ref 96–112)
Creat: 1.16 mg/dL — ABNORMAL HIGH (ref 0.50–1.10)
Glucose, Bld: 91 mg/dL (ref 70–99)
Potassium: 4.5 mEq/L (ref 3.5–5.3)
Sodium: 139 mEq/L (ref 135–145)
Total Bilirubin: 1.1 mg/dL (ref 0.2–1.2)
Total Protein: 7.5 g/dL (ref 6.0–8.3)

## 2014-03-18 LAB — LIPID PANEL
CHOLESTEROL: 161 mg/dL (ref 0–200)
HDL: 60 mg/dL (ref >39)
LDL Cholesterol: 87 mg/dL (ref 0–99)
TRIGLYCERIDES: 68 mg/dL (ref <150)
Total CHOL/HDL Ratio: 2.7 Ratio
VLDL: 14 mg/dL (ref 0–40)

## 2014-03-18 LAB — POCT GLYCOSYLATED HEMOGLOBIN (HGB A1C): Hemoglobin A1C: 5.5

## 2014-03-18 NOTE — Progress Notes (Addendum)
Chief Complaint  Patient presents with  . Hypertension    fasting med check.     Diabetes follow-up: She has never checked her blood sugars, has never been on medications. Sugars had improved with weight loss. She has since regained weight, but is in the process of losing it again.  She has lost almost 20 pounds from her last visit here 6 months ago.  She is going to cardiac rehab. Denies polydipsia, has some frequent urination due to diuretics (improved since decreasing dose). Last eye exam was in June 2014. Patient follows a low sugar diet. She checks feet regularly.  She had a pedicure yesterday and they mentioned a white spot on her right great toe.  Hypertension follow-up: Blood pressures at home have been good 110-120 systolic. Denies dizziness, headaches. Denies any muscle cramps/charlie horses like she used to have.  No edema. She cut back on the lasix to just once in the morning from BID about 3 months ago.  She has not had any recurrent swelling in her legs (and just recently got Dr. Harvie Bridge blessing to remain on the once daily dose).  Hyperlipidemia follow-up: Patient is reportedly following a low-fat, low cholesterol diet.  Compliant with her medications and denies any side effects.  GERD follow-up: Compliant with medications, denies any recurrent symptoms. Denies dysphagia. She takes prilosec twice daily, hasn't missed any doses.  Hypothyroidism: Compliant with medications, taking an hour before all other medications. No skin/hair changes. No bowel or mood changes.  Energy is good.  OSA--she sleeps with CPAP.  She feels good in the morning when she wakes (didn't have many symptoms prior to starting, related to energy).  Vitamin D--compliant in taking OTC vitamins.  Has been normal when checked multiple times.  COPD: She quit smoking 3 months ago when she went on oxygen.  She is followed by Dr. Craige Cotta, and doing well on her current regimen.  Hasn't needed to use albuterol.  Exercise:  She does cardiac rehab on Tues and Thurs, and exercise bike at home 15 minutes every day. She walks 20 minutes every day.  Past Medical History  Diagnosis Date  . Obesity   . Hypertension   . Hyperlipidemia   . Hypothyroidism   . Venous stasis of lower extremity     a. chronic LEE  . Tobacco dependence     a. 46 pack yr hx.  . Chest pain     a. 12/2011 Neg Lexi MV, EF 56%.    . Pericardial effusion     a. Large, s/p pericardiocentesis 01/02/12 yielding fluid (neg malignant cells, only inflammatory cells)  . DOE (dyspnea on exertion)     Chronic  . Anemia     Noted 12/2011  . Atrial fib/flutter, transient     In setting of pericardial effusion 12/2011  . Diastolic CHF   . Pneumonia 09/01/2013    "for the first time" (09/01/2013)  . Diabetes mellitus     "diet controlled; never been on RX; don't check sugar @ home" (09/01/2013)  . GERD (gastroesophageal reflux disease)   . OSA (obstructive sleep apnea) 11/05/2013    on CPAP  . Obesity hypoventilation syndrome 11/05/2013   Past Surgical History  Procedure Laterality Date  . Breast biopsy Right 1980's    a. Benign   . Cholecystectomy  1980's  . Appendectomy  ~ 2008  . Pericardiocentesis  12/2011    yielding fluid (neg malignant cells, only inflammatory cells)/notes 12/30/2011   History  Social History  . Marital Status: Married    Spouse Name: N/A    Number of Children: 0  . Years of Education: N/A   Occupational History  . Retired (Presenter, broadcasting) [employer]   Social History Main Topics  . Smoking status: Former Smoker -- 50 years    Types: Cigarettes    Quit date: 12/16/2013  . Smokeless tobacco: Never Used     Comment: Smokes 5 cigaretts daily  . Alcohol Use: Yes     Comment: glass of wine once per month  . Drug Use: No  . Sexual Activity: No   Other Topics Concern  . Not on file   Social History Narrative   Lives in [place] with husband.  Quit smoking 11/2013 (when she was put on oxygen)    Outpatient Encounter Prescriptions as of 03/18/2014  Medication Sig Note  . aspirin EC 81 MG EC tablet Take 1 tablet (81 mg total) by mouth daily.   Marland Kitchen atorvastatin (LIPITOR) 40 MG tablet Take 40 mg by mouth daily.   . benazepril (LOTENSIN) 20 MG tablet TAKE 1 TABLET BY MOUTH EVERY DAY   . budesonide-formoterol (SYMBICORT) 160-4.5 MCG/ACT inhaler Inhale 2 puffs into the lungs 2 (two) times daily.   Marland Kitchen CARTIA XT 240 MG 24 hr capsule TAKE 1 CAPSULE BY MOUTH EVERY DAY   . Cholecalciferol (VITAMIN D) 1000 UNITS capsule Take 1,000 Units by mouth daily.    . furosemide (LASIX) 40 MG tablet Takes once each day.   . levothyroxine (SYNTHROID, LEVOTHROID) 50 MCG tablet Take 1 tablet (50 mcg total) by mouth daily.   . metoprolol tartrate (LOPRESSOR) 25 MG tablet TAKE 1 TABLET BY MOUTH TWICE DAILY   . omeprazole (PRILOSEC) 20 MG capsule Take 1 capsule (20 mg total) by mouth 2 (two) times daily.   . potassium chloride (K-DUR,KLOR-CON) 10 MEQ tablet Take 10 mEq by mouth daily.   . Albuterol Sulfate (PROAIR RESPICLICK) 108 (90 BASE) MCG/ACT AEPB Inhale 2 puffs into the lungs every 6 (six) hours as needed. 03/18/2014: Just rx'd by pulm--hasn't needed to use yet  . nitroGLYCERIN (NITROSTAT) 0.4 MG SL tablet Place 1 tablet (0.4 mg total) under the tongue every 5 (five) minutes as needed for chest pain (up to 3 doses).    Allergies  Allergen Reactions  . Augmentin [Amoxicillin-Pot Clavulanate]     Abdominal pain   Immunization History  Administered Date(s) Administered  . Influenza Split 03/19/2012  . Influenza Whole 04/04/2009  . Influenza,inj,Quad PF,36+ Mos 03/11/2013, 03/12/2014  . PPD Test 12/31/2011  . Pneumococcal Conjugate-13 03/11/2013  . Pneumococcal Polysaccharide-23 03/18/2014  . Tdap 03/11/2013  . Zoster 05/05/2013    ROS:  No fevers, chills, headaches, dizziness, chest pain, palpitations, shortness of breath (hasn't needed albuterol).  Occasional cough.  No URI symptoms or allergy  symptoms.  No nausea, vomiting, bowel changes, abdominal pain.  No reflux symptoms (controlled by meds).  No urinary complaints, bleeding, bruising. Moods are good.  Denies depression/anxiety. Denies fatigue.  See HPI.   PHYSICAL EXAM: BP 140/78  Pulse 80  Ht 5\' 8"  (1.727 m)  Wt 307 lb (139.254 kg)  BMI 46.69 kg/m2 Well developed, well nourished, pleasant, obese female, wearing oxygen (with loud machine in room), speaking easily in no distress HEENT: PERRL, EOMI, conjunctiva clear. OP clear with moist mucus membranes  Neck: no lymphadenopathy, thyromegaly or carotid bruit  Heart: regular rate and rhythm, no murmurs noted Lungs: clear bilaterally, fair air movement. No wheezes, rales, ronchi  Abdomen: obese, nontender, no organomegaly or mass. Normal bowel sounds  Extremities: Feet--hyperpigmented/venous stasis changes bilaterally. Skin intact. No edema noted. Distal portion of bottom of R great toe: 3mm minimally raised white spot, very slightly fluctuant--could be very early/new blister. Skin is intact. Normal sensation, no rashes/lesions. Normal monofilament exam Psych: normal mood, affect, hygiene and grooming Neuro: alert and oriented. Cranial nerves intact. Normal strength, gait, sensation   Lab Results  Component Value Date   HGBA1C 5.5 03/18/2014   ASSESSMENT/PLAN:  Type II or unspecified type diabetes mellitus without mention of complication, not stated as uncontrolled - diet controlled.  continue exercise, weight loss, proper diet - Plan: HgB A1c, Comprehensive metabolic panel, Microalbumin / creatinine urine ratio, HM Diabetes Foot Exam  Essential hypertension, benign - controlled per numbers elswhere, borderline today - Plan: Comprehensive metabolic panel  Pure hypercholesterolemia - Plan: Lipid panel, Comprehensive metabolic panel  Unspecified hypothyroidism - euthyroid by history - Plan: TSH  Morbid obesity with body mass index of 40.0-49.9 - discussed diet, exercise,  portion sizes  Need for prophylactic vaccination against Streptococcus pneumoniae (pneumococcus) - Plan: Pneumococcal polysaccharide vaccine 23-valent greater than or equal to 2yo subcutaneous/IM  COPD (chronic obstructive pulmonary disease) with chronic bronchitis - stable, on oxygen   Pneumovax today (got flu shot at pulm)  Schedule routine diabetic eye exam. Have your husband check the spot on your great toe to make sure it heals. If it isn't healing, please return (or see podiatrist if you have one) for recheck.   6 months--AWV/med check+

## 2014-03-18 NOTE — Patient Instructions (Signed)
  Schedule routine diabetic eye exam. Have your husband check the spot on your great toe to make sure it heals. If it isn't healing, please return (or see podiatrist if you have one) for recheck.   We will be in touch with your test results in the next few days, and refill your thyroid medication

## 2014-03-19 LAB — MICROALBUMIN / CREATININE URINE RATIO
Creatinine, Urine: 31.3 mg/dL
MICROALB/CREAT RATIO: 118.2 mg/g — AB (ref 0.0–30.0)
Microalb, Ur: 3.7 mg/dL — ABNORMAL HIGH (ref <2.0)

## 2014-03-19 LAB — TSH: TSH: 1.419 u[IU]/mL (ref 0.350–4.500)

## 2014-03-19 MED ORDER — LEVOTHYROXINE SODIUM 50 MCG PO TABS: 50.0000 ug | ORAL_TABLET | Freq: Every day | ORAL | Status: DC

## 2014-03-19 NOTE — Addendum Note (Signed)
Addended byJoselyn Arrow on: 03/19/2014 08:50 AM   Modules accepted: Orders

## 2014-03-23 ENCOUNTER — Encounter (HOSPITAL_COMMUNITY): Payer: Medicare Other

## 2014-03-25 ENCOUNTER — Encounter (HOSPITAL_COMMUNITY): Payer: Medicare Other

## 2014-03-25 ENCOUNTER — Other Ambulatory Visit (HOSPITAL_COMMUNITY): Payer: Self-pay | Admitting: Cardiovascular Disease

## 2014-03-30 ENCOUNTER — Telehealth (HOSPITAL_COMMUNITY): Payer: Self-pay | Admitting: *Deleted

## 2014-03-30 ENCOUNTER — Encounter (HOSPITAL_COMMUNITY): Payer: Medicare Other

## 2014-04-01 ENCOUNTER — Encounter (HOSPITAL_COMMUNITY): Payer: Medicare Other

## 2014-04-06 ENCOUNTER — Encounter (HOSPITAL_COMMUNITY): Payer: Medicare Other

## 2014-04-08 ENCOUNTER — Encounter (HOSPITAL_COMMUNITY)
Admission: RE | Admit: 2014-04-08 | Discharge: 2014-04-08 | Disposition: A | Discharge status: Home / Self Care | Payer: Medicare Other | Source: Ambulatory Visit | Attending: Pulmonary Disease | Admitting: Pulmonary Disease

## 2014-04-08 DIAGNOSIS — I319 Disease of pericardium, unspecified: Secondary | ICD-10-CM | POA: Diagnosis not present

## 2014-04-08 DIAGNOSIS — R0600 Dyspnea, unspecified: Secondary | ICD-10-CM | POA: Diagnosis not present

## 2014-04-08 DIAGNOSIS — J449 Chronic obstructive pulmonary disease, unspecified: Secondary | ICD-10-CM | POA: Diagnosis not present

## 2014-04-08 NOTE — Progress Notes (Signed)
Today, Nancy West exercised at [facility]. Service time was from 1330 to 1515.  The patient exercised for more than 31 minutes performing aerobic, strengthening, and stretching exercises. Oxygen saturation, heart rate, blood pressure, rate of perceived exertion, and shortness of breath were all monitored before, during, and after exercise. Nancy West presented with no problems at today's exercise session. Nancy West also attended an education session on oxygen use and safety.  There was no workload change during today's exercise session.  Pre-exercise vitals:   Weight kg: 142.9   Liters of O2: 2L   SpO2: 97   HR: 69   BP: 116/64   CBG: na  Exercise vitals:   Highest heartrate:  90   Lowest oxygen saturation: 94   Highest blood pressure: 140/82   Liters of 02: 2L  Post-exercise vitals:   SpO2: 93   HR: 66   BP: 110/72   Liters of O2: 2L   CBG: na  Dr. Kalman Shan, Medical Director Dr. David Stall is immediately available during today's Pulmonary Rehab session for Nancy West on 04/08/2014 at 1330 class time.

## 2014-04-11 ENCOUNTER — Other Ambulatory Visit: Payer: Self-pay | Admitting: Cardiovascular Disease

## 2014-04-13 ENCOUNTER — Encounter (HOSPITAL_COMMUNITY)
Admission: RE | Admit: 2014-04-13 | Discharge: 2014-04-13 | Disposition: A | Discharge status: Home / Self Care | Payer: Medicare Other | Source: Ambulatory Visit | Attending: Pulmonary Disease | Admitting: Pulmonary Disease

## 2014-04-13 DIAGNOSIS — J449 Chronic obstructive pulmonary disease, unspecified: Secondary | ICD-10-CM | POA: Diagnosis not present

## 2014-04-13 NOTE — Progress Notes (Signed)
Today, Nancy West exercised at [facility]. Service time was from 1330 to 1500.  The patient exercised for more than 31 minutes performing aerobic, strengthening, and stretching exercises. Oxygen saturation, heart rate, blood pressure, rate of perceived exertion, and shortness of breath were all monitored before, during, and after exercise. Nancy West presented with a weight gain of 2.2 kg weight gain at today's exercise session.  She normally takes Lasix once daily, but has been instructed by her cardiologist to take her lasix twice a day until her weight decreases.  She was instructed to call her cardiologist if her weight has not decreased over the next 2 days.    There was no workload change during today's exercise session.  Pre-exercise vitals:   Weight kg: 145.1   Liters of O2: 2   SpO2: 95   HR: 68   BP: 140/64   CBG: na  Exercise vitals:   Highest heartrate:  95   Lowest oxygen saturation: 92   Highest blood pressure: 126/60   Liters of 02: 2  Post-exercise vitals:   SpO2: 92   HR: 74   BP: 122/70   Liters of O2: RA   CBG: NA Dr. Kalman Shan, Medical Director Dr. David Stall is immediately available during today's Pulmonary Rehab session for Nancy West on 04/13/2014 at 1330 class time.

## 2014-04-15 ENCOUNTER — Encounter (HOSPITAL_COMMUNITY)
Admission: RE | Admit: 2014-04-15 | Discharge: 2014-04-15 | Disposition: A | Discharge status: Home / Self Care | Payer: Medicare Other | Source: Ambulatory Visit | Attending: Pulmonary Disease | Admitting: Pulmonary Disease

## 2014-04-15 DIAGNOSIS — J449 Chronic obstructive pulmonary disease, unspecified: Secondary | ICD-10-CM | POA: Diagnosis not present

## 2014-04-15 NOTE — Progress Notes (Signed)
Today, Harmani exercised at [facility]. Service time was from 1330 to 1515.  The patient exercised for more than 31 minutes performing aerobic, strengthening, and stretching exercises. Oxygen saturation, heart rate, blood pressure, rate of perceived exertion, and shortness of breath were all monitored before, during, and after exercise. Ragina presented with no problems at today's exercise session. Patient attended the [place] services class today.   There was no workload change during today's exercise session.  Pre-exercise vitals:   Weight kg: 144.6   Liters of O2: 2   SpO2: 94   HR: 74   BP: 128/68   CBG: na  Exercise vitals:   Highest heartrate:  81   Lowest oxygen saturation: 91   Highest blood pressure: 122/60   Liters of 02: 2  Post-exercise vitals:   SpO2: 98   HR: 75   BP: 108/68   Liters of O2: 2   CBG: NA Dr. Kalman Shan, Medical Director Dr. Butler Denmark is immediately available during today's Pulmonary Rehab session for [name] on 04/15/2014 at 1330 class time.

## 2014-04-20 ENCOUNTER — Encounter (HOSPITAL_COMMUNITY)
Admission: RE | Admit: 2014-04-20 | Discharge: 2014-04-20 | Disposition: A | Discharge status: Home / Self Care | Payer: Medicare Other | Source: Ambulatory Visit | Attending: Pulmonary Disease | Admitting: Pulmonary Disease

## 2014-04-20 DIAGNOSIS — J449 Chronic obstructive pulmonary disease, unspecified: Secondary | ICD-10-CM | POA: Diagnosis not present

## 2014-04-20 NOTE — Progress Notes (Signed)
Today, Nancy West exercised at [facility] [employer]. Service time was from 1330 to 1500.  The patient exercised for more than 31 minutes performing aerobic, strengthening, and stretching exercises. Oxygen saturation, heart rate, blood pressure, rate of perceived exertion, and shortness of breath were all monitored before, during, and after exercise. Nancy West presented with no problems at today's exercise session.   There was a workload change during today's exercise session.  Pre-exercise vitals:   Weight kg: 144.2   Liters of O2: 2L   SpO2: 98   HR: 64   BP: 122/48   CBG: na  Exercise vitals:   Highest heartrate:  81   Lowest oxygen saturation: 96   Highest blood pressure: 138/66   Liters of 02: 2L  Post-exercise vitals:   SpO2: 94   HR: 65   BP: 116/68   Liters of O2: 2L   CBG: na  Dr. Kalman Shan, Medical Director Dr. David Stall is immediately available during today's Pulmonary Rehab session for Nancy West on 04/20/2014 at 1330 class time.

## 2014-04-22 ENCOUNTER — Encounter (HOSPITAL_COMMUNITY)
Admission: RE | Admit: 2014-04-22 | Discharge: 2014-04-22 | Disposition: A | Discharge status: Home / Self Care | Payer: Medicare Other | Source: Ambulatory Visit | Attending: Pulmonary Disease | Admitting: Pulmonary Disease

## 2014-04-22 DIAGNOSIS — J449 Chronic obstructive pulmonary disease, unspecified: Secondary | ICD-10-CM | POA: Diagnosis not present

## 2014-04-22 NOTE — Progress Notes (Signed)
Today, Dakaria exercised at [facility]. Service time was from 1330 to 1530.  The patient exercised for more than 31 minutes performing aerobic, strengthening, and stretching exercises. Oxygen saturation, heart rate, blood pressure, rate of perceived exertion, and shortness of breath were all monitored before, during, and after exercise. Jessalin presented with no problems at today's exercise session. Shavawn also attended an education session on Advanced directives.  There was no workload change during today's exercise session.  Pre-exercise vitals:   Weight kg: 144.5   Liters of O2: 2L   SpO2: 94   HR: 71   BP: 108/50   CBG: na  Exercise vitals:   Highest heartrate:  81   Lowest oxygen saturation: 91   Highest blood pressure: 130/58   Liters of 02: 2L  Post-exercise vitals:   SpO2: 92   HR: 75   BP: 118/66   Liters of O2: ra   CBG: na  Dr. Kalman Shan, Medical Director Dr. David Stall is immediately available during today's Pulmonary Rehab session for [name] on 04/22/2014 at 1330 class time.

## 2014-04-26 ENCOUNTER — Other Ambulatory Visit: Payer: Self-pay | Admitting: Family Medicine

## 2014-04-27 ENCOUNTER — Encounter (HOSPITAL_COMMUNITY): Payer: Medicare Other

## 2014-04-27 DIAGNOSIS — E119 Type 2 diabetes mellitus without complications: Secondary | ICD-10-CM | POA: Diagnosis not present

## 2014-04-27 DIAGNOSIS — H35363 Drusen (degenerative) of macula, bilateral: Secondary | ICD-10-CM | POA: Diagnosis not present

## 2014-04-27 DIAGNOSIS — H2513 Age-related nuclear cataract, bilateral: Secondary | ICD-10-CM | POA: Diagnosis not present

## 2014-04-27 DIAGNOSIS — H524 Presbyopia: Secondary | ICD-10-CM | POA: Diagnosis not present

## 2014-04-27 LAB — HM DIABETES EYE EXAM

## 2014-04-29 ENCOUNTER — Encounter (HOSPITAL_COMMUNITY): Payer: Medicare Other

## 2014-05-04 ENCOUNTER — Encounter (HOSPITAL_COMMUNITY)
Admission: RE | Admit: 2014-05-04 | Discharge: 2014-05-04 | Disposition: A | Discharge status: Home / Self Care | Payer: Medicare Other | Source: Ambulatory Visit | Attending: Pulmonary Disease | Admitting: Pulmonary Disease

## 2014-05-04 DIAGNOSIS — I319 Disease of pericardium, unspecified: Secondary | ICD-10-CM | POA: Diagnosis not present

## 2014-05-04 DIAGNOSIS — R0600 Dyspnea, unspecified: Secondary | ICD-10-CM | POA: Diagnosis not present

## 2014-05-04 DIAGNOSIS — J449 Chronic obstructive pulmonary disease, unspecified: Secondary | ICD-10-CM | POA: Diagnosis not present

## 2014-05-04 NOTE — Progress Notes (Signed)
Today, Nancy West exercised at [facility]. Service time was from 1330 to 1450.  The patient exercised for more than 31 minutes performing aerobic, strengthening, and stretching exercises. Oxygen saturation, heart rate, blood pressure, rate of perceived exertion, and shortness of breath were all monitored before, during, and after exercise. Nancy West presented with a weight gain today of 4 kg.  She has been smoke free for 2 months, she and her husband have quit together and they both are making wrong choices with substitutes for cigarettes.  Counselled patient on cutting up fruit and vegetables and place in a bag in the refrigerator to munch on when she is craving a cigarette.  Also to drink lots of water.  Also, given a Quit Smart cigarette for home use.  Patient admits to making bad choices and knows she cannot gain anymore weight.  Lungs clear, no additional peripheral edema.    There was  no workload change during today's exercise session.  Pre-exercise vitals: . Weight kg: 148.5 . Liters of O2: 2 . SpO2: 95 . HR: 71 . BP: 118/52 . CBG: na  Exercise vitals: . Highest heartrate:  88 . Lowest oxygen saturation: 90 . Highest blood pressure: 126/64 . Liters of 02: 2  Post-exercise vitals: . SpO2: 94 . HR: 72 . BP: 120/72 . Liters of O2: RA . CBG: na Dr. Kalman Shan, Medical Director Dr. Butler Denmark is immediately available during today's Pulmonary Rehab session for Nancy West on 05/04/2014 at 1330 class time.  Marland Kitchen

## 2014-05-12 ENCOUNTER — Other Ambulatory Visit: Payer: Self-pay | Admitting: Cardiovascular Disease

## 2014-05-12 ENCOUNTER — Telehealth: Payer: Self-pay | Admitting: Pulmonary Disease

## 2014-05-12 MED ORDER — PREDNISONE 10 MG PO TABS: ORAL_TABLET | ORAL | Status: DC

## 2014-05-12 MED ORDER — DOXYCYCLINE HYCLATE 100 MG PO TABS: 100.0000 mg | ORAL_TABLET | Freq: Two times a day (BID) | ORAL | Status: DC

## 2014-05-12 NOTE — Telephone Encounter (Signed)
Spoke with pt. Aware that abx and pred are being called into [facility] Pt aware to call back if symptoms worsen or do not improve.  Nothing further needed.

## 2014-05-12 NOTE — Telephone Encounter (Signed)
Pt c/o chest congestion, cough in AM with brown mucus and chest tightness. Pt denies any head congestion and SOB. Denies using any OTC meds for current symptoms.  Allergies  Allergen Reactions  . Augmentin [Amoxicillin-Pot Clavulanate]     Abdominal pain   [facility] [address] [place]   Please advise Dr Craige Cotta, thanks.

## 2014-05-12 NOTE — Telephone Encounter (Signed)
Call is script for doxycycline 100 mg bid, dispense 14 pills with no refills.    Also call in script for prednisone 10 mg pills >> 2 pills daily for 2 days, 1 pill daily for 2 days, 1/2 pill daily for 2 days.  Dispense 7 pills with no refills.  Advise her to call back to schedule ROV if no better with Abx.

## 2014-05-14 ENCOUNTER — Telehealth: Payer: Self-pay | Admitting: Pulmonary Disease

## 2014-05-14 NOTE — Telephone Encounter (Signed)
Patient states that the Doxycycline and the Prednisone is working great, she said she feels much better.  Her O2 readings are higher as well.  She said that her oxygen has not dropped below 90 since being on the prednisone.

## 2014-05-14 NOTE — Telephone Encounter (Signed)
Noted  

## 2014-05-18 ENCOUNTER — Encounter (HOSPITAL_COMMUNITY)
Admission: RE | Admit: 2014-05-18 | Discharge: 2014-05-18 | Disposition: A | Discharge status: Home / Self Care | Payer: Medicare Other | Source: Ambulatory Visit | Attending: Pulmonary Disease | Admitting: Pulmonary Disease

## 2014-05-18 DIAGNOSIS — J449 Chronic obstructive pulmonary disease, unspecified: Secondary | ICD-10-CM | POA: Diagnosis not present

## 2014-05-18 NOTE — Progress Notes (Signed)
Today, Elaria exercised at [facility] [employer] [facility]. Service time was from 1330 to 1450.  The patient exercised for more than 31 minutes performing aerobic, strengthening, and stretching exercises. Oxygen saturation, heart rate, blood pressure, rate of perceived exertion, and shortness of breath were all monitored before, during, and after exercise. Anoushka presented with no problems at today's exercise session.   There was no workload change during today's exercise session.  Pre-exercise vitals: . Weight kg: 147.8 . Liters of O2: 2L . SpO2: 97 . HR: 68 . BP: 122/66 . CBG: na  Exercise vitals: . Highest heartrate:  78 . Lowest oxygen saturation: 93 . Highest blood pressure: 136/70 . Liters of 02: na  Post-exercise vitals: . SpO2: 94 . HR: 67 . BP: 114/60 . Liters of O2: ra . CBG: na  Dr. Kalman Shan, Medical Director Dr. Butler Denmark is immediately available during today's Pulmonary Rehab session for JERALINE DOOLIN on 05/18/2014 at 1330 class time.

## 2014-05-25 ENCOUNTER — Telehealth: Payer: Self-pay | Admitting: Pulmonary Disease

## 2014-05-25 ENCOUNTER — Encounter (HOSPITAL_COMMUNITY): Payer: Medicare Other

## 2014-05-25 ENCOUNTER — Telehealth (HOSPITAL_COMMUNITY): Payer: Self-pay | Admitting: Family Medicine

## 2014-05-25 MED ORDER — AZITHROMYCIN 250 MG PO TABS: ORAL_TABLET | ORAL | Status: DC

## 2014-05-25 NOTE — Telephone Encounter (Signed)
rx called in, pt aware of recs.  Nothing further needed.

## 2014-05-25 NOTE — Telephone Encounter (Signed)
Spoke with pt- Pt states that she is sick again -- diff than before, Denies chest congestion, SOB. C/o head congestion, PND, runny nose, some sore throat and some cough. Some discoloration to mucus. Has not treated with OTC meds. Requesting abx Allergies  Allergen Reactions  . Augmentin [Amoxicillin-Pot Clavulanate]     Abdominal pain   [facility] [address] [place]  Please advise Dr Craige Cotta. Thanks.

## 2014-05-25 NOTE — Telephone Encounter (Signed)
Can send script for zithromax 250 mg pill, 2 pills on day 1, then 1 pill daily for next 4 days.

## 2014-05-27 ENCOUNTER — Encounter (HOSPITAL_COMMUNITY): Payer: Medicare Other

## 2014-06-01 ENCOUNTER — Encounter (HOSPITAL_COMMUNITY): Admission: RE | Admit: 2014-06-01 | Payer: Medicare Other | Source: Ambulatory Visit

## 2014-06-03 ENCOUNTER — Encounter (HOSPITAL_COMMUNITY): Payer: Medicare Other

## 2014-06-03 ENCOUNTER — Encounter (HOSPITAL_COMMUNITY): Payer: Self-pay | Admitting: Cardiovascular Disease

## 2014-06-07 ENCOUNTER — Encounter: Payer: Self-pay | Admitting: Internal Medicine

## 2014-06-08 ENCOUNTER — Encounter (HOSPITAL_COMMUNITY)
Admission: RE | Admit: 2014-06-08 | Discharge: 2014-06-08 | Disposition: A | Discharge status: Home / Self Care | Payer: Medicare Other | Source: Ambulatory Visit | Attending: Pulmonary Disease | Admitting: Pulmonary Disease

## 2014-06-08 DIAGNOSIS — J449 Chronic obstructive pulmonary disease, unspecified: Secondary | ICD-10-CM | POA: Insufficient documentation

## 2014-06-08 DIAGNOSIS — R0600 Dyspnea, unspecified: Secondary | ICD-10-CM | POA: Insufficient documentation

## 2014-06-08 DIAGNOSIS — I319 Disease of pericardium, unspecified: Secondary | ICD-10-CM | POA: Insufficient documentation

## 2014-06-10 ENCOUNTER — Other Ambulatory Visit: Payer: Self-pay | Admitting: Cardiovascular Disease

## 2014-06-10 ENCOUNTER — Encounter (HOSPITAL_COMMUNITY): Payer: Medicare Other

## 2014-06-10 NOTE — Progress Notes (Signed)
Discharge Note from Pulmonary Rehab  Nancy West was discharged from the pulmonary rehab program 06/08/2014.  Her attendance was irregular due to respiratory illnesses and deaths in her immediate family.  Her biggest accomplishment was quitting smoking.  She and her husband both quit and have been smoke free for 2 1/2 months.  Unfortunately Nancy West has gained 14 kg while in the program.  We discussed ways to stay occupied to prevent overeating or going back to smoking.  Patient was given a better quit cigarette and suggested snacking on cut up fruit and vegetables to cut back on high calorie foods, and drinking lots of water.  On patient's last day of class she became teary when she discussed the death of her mother and mother-in-law which occurred this year.  Patient scored 10 on the PHQ scale.  We discussed the option of talking with a counselor to help her deal with her recent loss and help her with her depression and overeating.  Patient went to a counselor with her mother in the past, felt she could talk to this person, and says she will call this person.  She did meet her goals for the program which were to learn more about exercise safety and to make her daily activities easier by becoming stronger.

## 2014-06-15 ENCOUNTER — Encounter (HOSPITAL_COMMUNITY): Payer: Medicare Other

## 2014-06-17 ENCOUNTER — Encounter (HOSPITAL_COMMUNITY): Payer: Medicare Other

## 2014-06-22 ENCOUNTER — Encounter (HOSPITAL_COMMUNITY): Payer: Medicare Other

## 2014-06-24 ENCOUNTER — Encounter (HOSPITAL_COMMUNITY): Payer: Medicare Other

## 2014-06-29 ENCOUNTER — Encounter (HOSPITAL_COMMUNITY): Payer: Medicare Other

## 2014-07-01 ENCOUNTER — Encounter (HOSPITAL_COMMUNITY): Payer: Medicare Other

## 2014-07-06 ENCOUNTER — Encounter (HOSPITAL_COMMUNITY): Payer: Medicare Other

## 2014-07-08 ENCOUNTER — Encounter (HOSPITAL_COMMUNITY): Payer: Medicare Other

## 2014-07-13 ENCOUNTER — Encounter (HOSPITAL_COMMUNITY): Payer: Medicare Other

## 2014-07-14 ENCOUNTER — Telehealth: Payer: Self-pay | Admitting: *Deleted

## 2014-07-14 NOTE — Telephone Encounter (Signed)
-----   Message from Joselyn Arrow, MD sent at 07/13/2014  9:16 AM EST ----- Regarding: pt needs to schedule DEXA Pt never scheduled DEXA that was ordered.  I extended order.  She does NOT need to wait until her next physical, please have her schedule now.  ----- Message -----    From: SYSTEM    Sent: 05/09/2014  12:05 AM      To: Joselyn Arrow, MD

## 2014-07-14 NOTE — Telephone Encounter (Signed)
Spoke with patient and she will call TBC and schedule. She did have to move her med check plus to 11/29/14 as she will be OOT for 45 days and cannot make the appt originally scheduled for 09/20/14-she will OOT.

## 2014-07-15 ENCOUNTER — Encounter (HOSPITAL_COMMUNITY): Payer: Medicare Other

## 2014-07-24 ENCOUNTER — Other Ambulatory Visit: Payer: Self-pay | Admitting: Family Medicine

## 2014-09-20 ENCOUNTER — Encounter: Payer: Medicare Other | Admitting: Family Medicine

## 2014-10-22 ENCOUNTER — Other Ambulatory Visit: Payer: Self-pay | Admitting: Family Medicine

## 2014-11-04 ENCOUNTER — Ambulatory Visit (INDEPENDENT_AMBULATORY_CARE_PROVIDER_SITE_OTHER): Payer: Medicare Other | Admitting: Pulmonary Disease

## 2014-11-04 ENCOUNTER — Encounter: Payer: Self-pay | Admitting: Pulmonary Disease

## 2014-11-04 VITALS — BP 124/84 | HR 71 | Ht 68.0 in | Wt 361.4 lb

## 2014-11-04 DIAGNOSIS — E662 Morbid (severe) obesity with alveolar hypoventilation: Secondary | ICD-10-CM

## 2014-11-04 DIAGNOSIS — Z9989 Dependence on other enabling machines and devices: Principal | ICD-10-CM

## 2014-11-04 DIAGNOSIS — J449 Chronic obstructive pulmonary disease, unspecified: Secondary | ICD-10-CM | POA: Diagnosis not present

## 2014-11-04 DIAGNOSIS — G4733 Obstructive sleep apnea (adult) (pediatric): Secondary | ICD-10-CM

## 2014-11-04 NOTE — Patient Instructions (Signed)
Can try stopping symbicort Follow up in 6 months

## 2014-11-04 NOTE — Progress Notes (Signed)
Chief Complaint  Patient presents with  . Follow-up    Pt here for recert of O2. Pt reports "good days vs bad days", O2 levels staying consistent. Wears CPAP nightly. Denies any issues with mask/pressure.     History of Present Illness: Nancy West is a 69 y.o. female former smoker with dyspnea.  She has hx of OSA/OHS on CPAP and home oxygen, diastolic heart failure and COPD.  She stopped smoking 8 months ago!!!  She no longer has a cough.  Her voice quality is better.  She is using symbicort once per day, and not sure it is helping much since she stopped smoking.  She is not having wheeze.  She will get winded with activity >> mostly when she doesn't put her oxygen on.  She denies chest pain or leg swelling.  She is doing well with CPAP.  TESTS: CT chest 09/01/13 >> patchy ATX  Echo 09/02/13 >> EF 55 to 65%, grade 2 diastolic dysfx, mod LA dilation, mod/severe RV dilation  PSG 10/21/13 >> AHI 22.2, SpO2 low 80%  RA SaO2 at rest 11/05/13 >> 81%; start 2 liters oxygen 24/7 PFT 12/21/13 >> FEV1 1.22 (48%) FEV1% 73, TLC 3.97 (75%), DLCO 57%, no BD CPAP titration 12/16/13 >> CPAP 6 >> AHI 0. No REM, disrupted sleep. Auto CPAP 10/03/14 to 11/01/14 >> used on 26 of 30 nights with average 7 hrs and 27 min.  Average AHI is 1.4 with median CPAP 7 cm H2O and 95 th percentile CPAP 10 cm H20.  PMHx >> HTN, HLD, Hypothyroidism, A fib/flutter, Diastolic CHF, DM, GERD  PSHx, Medications, Allergies, Fhx, Shx reviewed.  Physical Exam: Blood pressure 124/84, pulse 71, height 5\' 8"  (1.727 m), weight 361 lb 6.4 oz (163.93 kg), SpO2 96 %. Body mass index is 54.96 kg/(m^2).  General - No distress, wearing oxygen ENT - No sinus tenderness, no oral exudate, no LAN Cardiac - s1s2 regular, no murmur Chest - No wheeze/rales/dullness Back - No focal tenderness Abd - Soft, non-tender Ext - No edema Neuro - Normal strength Skin - No rashes Psych - normal mood, and behavior   Assessment/Plan:  COPD  with chronic bronchitis. Improved symptoms since stopping cigarettes. Plan: - she can try stopping symbicort - continue prn albuterol  Obstructive sleep apnea. She is compliant with CPAP and reports benefit. Plan: - continue auto CPAP  Obesity hypoventilation syndrome. Plan: - continue 2 liters oxygen with exertion and sleep   Coralyn Helling, MD [place] [facility] Pager:  [phone]

## 2014-11-29 ENCOUNTER — Other Ambulatory Visit: Payer: Self-pay | Admitting: Family Medicine

## 2014-11-29 ENCOUNTER — Ambulatory Visit (INDEPENDENT_AMBULATORY_CARE_PROVIDER_SITE_OTHER): Payer: Medicare Other | Admitting: Family Medicine

## 2014-11-29 ENCOUNTER — Encounter: Payer: Self-pay | Admitting: Family Medicine

## 2014-11-29 VITALS — BP 130/80 | HR 72 | Ht 68.0 in | Wt 372.2 lb

## 2014-11-29 DIAGNOSIS — G4733 Obstructive sleep apnea (adult) (pediatric): Secondary | ICD-10-CM

## 2014-11-29 DIAGNOSIS — E7800 Pure hypercholesterolemia, unspecified: Secondary | ICD-10-CM

## 2014-11-29 DIAGNOSIS — Z0000 Encounter for general adult medical examination without abnormal findings: Secondary | ICD-10-CM | POA: Diagnosis not present

## 2014-11-29 DIAGNOSIS — R195 Other fecal abnormalities: Secondary | ICD-10-CM | POA: Diagnosis not present

## 2014-11-29 DIAGNOSIS — E039 Hypothyroidism, unspecified: Secondary | ICD-10-CM | POA: Diagnosis not present

## 2014-11-29 DIAGNOSIS — J449 Chronic obstructive pulmonary disease, unspecified: Secondary | ICD-10-CM | POA: Diagnosis not present

## 2014-11-29 DIAGNOSIS — E119 Type 2 diabetes mellitus without complications: Secondary | ICD-10-CM

## 2014-11-29 DIAGNOSIS — Z01419 Encounter for gynecological examination (general) (routine) without abnormal findings: Secondary | ICD-10-CM

## 2014-11-29 DIAGNOSIS — I10 Essential (primary) hypertension: Secondary | ICD-10-CM | POA: Diagnosis not present

## 2014-11-29 DIAGNOSIS — I5032 Chronic diastolic (congestive) heart failure: Secondary | ICD-10-CM

## 2014-11-29 DIAGNOSIS — F321 Major depressive disorder, single episode, moderate: Secondary | ICD-10-CM | POA: Diagnosis not present

## 2014-11-29 DIAGNOSIS — Z7189 Other specified counseling: Secondary | ICD-10-CM | POA: Diagnosis not present

## 2014-11-29 LAB — CBC WITH DIFFERENTIAL/PLATELET
Basophils Absolute: 0 10*3/uL (ref 0.0–0.1)
Basophils Relative: 0 % (ref 0–1)
EOS PCT: 2 % (ref 0–5)
Eosinophils Absolute: 0.2 10*3/uL (ref 0.0–0.7)
HCT: 36.3 % (ref 36.0–46.0)
Hemoglobin: 11.9 g/dL — ABNORMAL LOW (ref 12.0–15.0)
LYMPHS PCT: 14 % (ref 12–46)
Lymphs Abs: 1.1 10*3/uL (ref 0.7–4.0)
MCH: 29.4 pg (ref 26.0–34.0)
MCHC: 32.8 g/dL (ref 30.0–36.0)
MCV: 89.6 fL (ref 78.0–100.0)
MONO ABS: 0.6 10*3/uL (ref 0.1–1.0)
MONOS PCT: 7 % (ref 3–12)
MPV: 9.9 fL (ref 8.6–12.4)
Neutro Abs: 6.2 10*3/uL (ref 1.7–7.7)
Neutrophils Relative %: 77 % (ref 43–77)
Platelets: 198 10*3/uL (ref 150–400)
RBC: 4.05 MIL/uL (ref 3.87–5.11)
RDW: 13.4 % (ref 11.5–15.5)
WBC: 8 10*3/uL (ref 4.0–10.5)

## 2014-11-29 NOTE — Patient Instructions (Addendum)
HEALTH MAINTENANCE RECOMMENDATIONS:  It is recommended that you get at least 30 minutes of aerobic exercise at least 5 days/week (for weight loss, you may need as much as 60-90 minutes). This can be any activity that gets your heart rate up. This can be divided in 10-15 minute intervals if needed, but try and build up your endurance at least once a week.  Weight bearing exercise is also recommended twice weekly.  Eat a healthy diet with lots of vegetables, fruits and fiber.  "Colorful" foods have a lot of vitamins (ie green vegetables, tomatoes, red peppers, etc).  Limit sweet tea, regular sodas and alcoholic beverages, all of which has a lot of calories and sugar.  Up to 1 alcoholic drink daily may be beneficial for women (unless trying to lose weight, watch sugars).  Drink a lot of water.  Calcium recommendations are 1200-1500 mg daily (1500 mg for postmenopausal women or women without ovaries), and vitamin D 1000 IU daily.  This should be obtained from diet and/or supplements (vitamins), and calcium should not be taken all at once, but in divided doses.  Monthly self breast exams and yearly mammograms for women over the age of 21 is recommended.  Sunscreen of at least SPF 30 should be used on all sun-exposed parts of the skin when outside between the hours of 10 am and 4 pm (not just when at beach or pool, but even with exercise, golf, tennis, and yard work!)  Use a sunscreen that says "broad spectrum" so it covers both UVA and UVB rays, and make sure to reapply every 1-2 hours.  Remember to change the batteries in your smoke detectors when changing your clock times in the spring and fall.  Use your seat belt every time you are in a car, and please drive safely and not be distracted with cell phones and texting while driving.  Please call Dr. Kenna Gilbert office and schedule colonoscopy. I think it is still a good idea to get your bone density test.  Please call the [facility]--see if you can  schedule your mammogram at the [facility] at the same time.  Your depression screen today was abnormal--it is clear that you have some depression.  We will make sure that your thyroid isn't contributing. You are not interested in taking medications, so I STRONGLY encourage you to get counseling.  I really like Berniece Andreas with [facility].  Try and cut back on the alcohol to help with weight and sugar.   Ms. Raben , Thank you for taking time to come for your Medicare Wellness Visit. I appreciate your ongoing commitment to your health goals. Please review the following plan we discussed and let me know if I can assist you in the future.   These are the goals we discussed: Goals    None      This is a list of the screening recommended for you and due dates:  Health Maintenance  Topic Date Due  . Mammogram  02/01/2011  . DEXA scan (bone density measurement)  12/14/2012  . Hemoglobin A1C  09/16/2014  . Flu Shot  01/24/2015  . Complete foot exam   03/19/2015  . Urine Protein Check  03/19/2015  . Eye exam for diabetics  04/28/2015  . Colon Cancer Screening  08/04/2015  . Tetanus Vaccine  03/12/2023  . Shingles Vaccine  Completed  . Pneumonia vaccines  Completed   The above due date for colonoscopy is incorrect.  You were due for a  5 year re-check, not 10, so was due in 2012.   A1c, foot exam and protein check will be done today Flu shots every fall. Schedule your mammogram and bone density.

## 2014-11-29 NOTE — Progress Notes (Signed)
Chief Complaint  Patient presents with  . med check plus.    med check plus. no other concerns. breast and pelvic exam   Nancy West is a 69 y.o. female who presents for annual wellness visit and follow-up on chronic medical conditions.    Diabetes follow-up: She has never checked her blood sugars, has never been on medications. Sugars had improved with weight loss. She has since regained weight.  Denies polydipsia, has some frequent urination due to diuretics. Last eye exam was in 04/2014. She admits poor diet recently. She checks feet regularly.   Hypertension follow-up: Blood pressures at home have been good 120/65. Denies dizziness, headaches. Occasional muscle cramps/charlie horses, not nearly as bad as in the past. No edema usually--just today noticed some swollen ankles, so plans to take an extra dose of lasix later. She had cut back on the lasix to just once in the morning, and uses BID only prn.  She hadn't had any swelling recently, until today.  She had a bologna sandwich yesterday.  Hyperlipidemia follow-up: Patient admits to noncompliance with her diet.  "I'm eating everything wrong" (related to depression). Compliant with her medications and denies any side effects.  GERD follow-up: Compliant with medications, denies any recurrent symptoms. Denies dysphagia. She takes prilosec twice daily, hasn't missed any doses.  Hypothyroidism: Compliant with medications, taking an hour before all other medications. No skin/hair changes. No bowel changes. Energy is poor, moods are depressed.  Vitamin D deficiency--compliant in taking OTC vitamins. Has been normal when checked multiple times.  OSA: on CPAP COPD: improved some since quitting smoking.  She is on oxygen with sleep and exertion; gets winded with any activity without oxygen. She saw Dr. Craige Cotta last month, and it was mentioned she can try stopping symbicort; she tried it for a week, but she didn't feel comfortable, so she resumed  taking it.   Immunization History  Administered Date(s) Administered  . Influenza Split 03/19/2012  . Influenza Whole 04/04/2009  . Influenza,inj,Quad PF,36+ Mos 03/11/2013, 03/12/2014  . PPD Test 12/31/2011  . Pneumococcal Conjugate-13 03/11/2013  . Pneumococcal Polysaccharide-23 03/18/2014  . Tdap 03/11/2013  . Zoster 05/05/2013   Last Pap smear: 02/2013; normal with no high risk HPV Last mammogram: 2010 Last colonoscopy: 2/07 Dr. Loreta Ave; adenomatous polyp found (was to repeat in 5 yrs, hasn't) Last DEXA: she was referred by [place] in the past, but she admits to not getting this Dentist: twice yearly Ophtho: yearly Exercise: None  Other doctors caring for patient include: Pulmonary: Dr. Craige Cotta Cardiology: Dr. Elease Hashimoto GI: Dr. Loreta Ave (last seen in 2007) Ophtho: can't recall his name, in [place] Dentist:  Dr. Marcha Solders  Depression screen:  See screen in Epic-- ABNORMAL ADL screen:  See screen in epic--limited due to COPD/dyspnea.  End of Life Discussion:  Patient has a living will and medical power of attorney She is DNR.    ROS: +weight gain (over 55-60# in the last year); + depression (no suicidal ideation), dyspnea.  Feet feel tingly, like asleep. Internal hemorrhoids, occasionally bleeding Denies fever, chills, chest pain, palpitations, GI or GU complaints. No other bleeding, no bruising, rash.   See HPI   PHYSICAL EXAM:  BP 130/80 mmHg  Pulse 72  Ht  (1.727 m)  Wt 372 lb 3.2 oz (168.829 kg)  BMI 56.61 kg/m2   General Appearance:   Alert, cooperative, appears stated age, obese, wearing oxygen.  Easily winded with any movement exertion (changing into gown)  Head:  Normocephalic, without obvious abnormality, atraumatic  Eyes:   PERRL, conjunctiva/corneas clear, EOM's intact, fundi   benign  Ears:   Normal TM's and external ear canals  Nose:  Nares normal, wearing nasal cannula. Mucosa not examined.  Throat:  Lips, mucosa, and tongue normal; teeth  and gums normal  Neck:  Supple, no lymphadenopathy; thyroid: no enlargement/tenderness/nodules; no carotid  bruit or JVD  Back:  Spine nontender, no curvature, ROM normal, no CVA tenderness  Lungs:   Clear to auscultation bilaterally without wheezes, rales or ronchi; respirations unlabored when at rest.  Chest Wall:   No tenderness or deformity  Heart:   Regular rate and rhythm, S1 and S2 normal, no murmur, rub  or gallop  Breast Exam:   No tenderness, masses, or nipple discharge or inversion. No axillary lymphadenopathy. Seborrheic keratoses at nipples bilaterally.   Abdomen:   Soft, non-tender, nondistended, normoactive bowel sounds,   no masses, no hepatosplenomegaly. obese  Genitalia:   Normal external genitalia without lesions. Mild atrophic changes. BUS and vagina normal; no cervical motion tenderness. No abnormal vaginal discharge. Uterus and adnexa not enlarged, although exam is severely limited due to body habitus; nontender, no masses. Pap not performed  Rectal:   Normal tone, no masses or tenderness; guaiac positive, brown stool  Extremities:  No clubbing, cyanosis; 1+ edema bilaterally (pretibial) with some venous stasis changes/hyperpigmentation noted in lower extremities bilaterally  Pulses:  somewhat diminished; feet are warm, brisk cap refill  Skin:  Skin color, texture, turgor normal, no rashes or lesions. Venous stasis changes with hyperpigmentation at bilateral lower extremities.  Lymph nodes:  Cervical, supraclavicular, and axillary nodes normal  Neurologic:  CNII-XII intact, normal strength, sensation and gait; reflexes 2+ and symmetric throughout   Psych: She appears somewhat depressed today; full range of affect.  Some body odor noted, but overall normal hygiene and grooming          ASSESSMENT/PLAN:  Medicare annual wellness visit,  initial  Essential hypertension, benign - controlled - Plan: Comprehensive metabolic panel  Pure hypercholesterolemia - Plan: Lipid panel, Comprehensive metabolic panel  Chronic diastolic heart failure  COPD (chronic obstructive pulmonary disease) with chronic bronchitis - continue current meds, oxygen, and care by Dr. Craige Cotta  OSA (obstructive sleep apnea) - continue PAP.  weight loss encouraged  Controlled diabetes mellitus type II without complication - diet controlled in the past--but significant changes in diet, weight gain and no exercise recently. check labs - Plan: Hemoglobin A1c, Comprehensive metabolic panel, Microalbumin / creatinine urine ratio  Hypothyroidism - check TSH given depression, significant weight change. compliant with meds - Plan: TSH  Heme positive stool - possibly from hemorrhoid; past due for colonoscopy. check CBC - Plan: CBC with Differential/Platelet  Major depressive disorder, single episode, moderate - declines medication; encouraged counseling; discussed impact of slef med (with food, alcohol) and poor choices (diet, weight gain) impact her overall health  Discussed monthly self breast exams and yearly mammograms; at least 30 minutes of aerobic activity at least 5 days/week and weight-bearing exercise 2x/week; proper sunscreen use reviewed; healthy diet, including goals of calcium and vitamin D intake and alcohol recommendations (less than or equal to 1 drink/day) reviewed; regular seatbelt use; changing batteries in smoke detectors.  Immunization recommendations discussed.  Colonoscopy recommendations reviewed--past due.  Counseling for depression--she isn't interested in medications. Encouraged counseling and given Raynelle Fanning Whitt's card. Counseled re: risks of her dietary noncompliance on her health.  Encouraged low sodium diet, daily exercise, weight loss.  Avoid  sweets, cut back on alcohol.  Counseled re: mammogram and DEXA and colonoscopy (risks, benefits,  reasons for recommending).  She understands the needs for these tests, and hopefully will schedule  MOST form filled out--no CPR, limited additional medical interventions. DNR form filled out  Face to face visit was over an hour, more than 1/2 spent counseling.  Medicare Attestation I have personally reviewed: The patient's medical and social history Their use of alcohol, tobacco or illicit drugs Their current medications and supplements The patient's functional ability including ADLs,fall risks, home safety risks, cognitive, and hearing and visual impairment Diet and physical activities Evidence for depression or mood disorders  The patient's weight, height, BMI, and visual acuity have been recorded in the chart.  I have made referrals, counseling, and provided education to the patient based on review of the above and I have provided the patient with a written personalized care plan for preventive services.     Glenden Rossell A, MD   11/29/2014

## 2014-11-30 LAB — MICROALBUMIN / CREATININE URINE RATIO
Creatinine, Urine: 24 mg/dL
Microalb Creat Ratio: 262.5 mg/g — ABNORMAL HIGH (ref 0.0–30.0)
Microalb, Ur: 6.3 mg/dL — ABNORMAL HIGH (ref <2.0)

## 2014-11-30 LAB — HEMOGLOBIN A1C
HEMOGLOBIN A1C: 6.3 % — AB (ref <5.7)
Mean Plasma Glucose: 134 mg/dL — ABNORMAL HIGH (ref <117)

## 2014-12-01 NOTE — Addendum Note (Signed)
Addended by: Herminio Commons A on: 12/01/2014 09:23 AM   Modules accepted: Orders

## 2014-12-02 ENCOUNTER — Other Ambulatory Visit: Payer: Medicare Other

## 2015-01-19 ENCOUNTER — Telehealth: Payer: Self-pay | Admitting: Pulmonary Disease

## 2015-01-19 NOTE — Telephone Encounter (Signed)
Patient dropped off handicapped form to be filled out and signed by Dr Craige Cotta.  This has been placed in VS look at folder to fill out.  Please advise Dr Craige Cotta. Thanks.

## 2015-01-21 ENCOUNTER — Other Ambulatory Visit: Payer: Self-pay | Admitting: Family Medicine

## 2015-01-24 NOTE — Telephone Encounter (Signed)
Form completed.

## 2015-01-25 NOTE — Telephone Encounter (Signed)
Form mailed to the pt and copy placed in VS's scan folder

## 2015-01-25 NOTE — Telephone Encounter (Signed)
Pt returned call Pt would like for form to be mailed.  No need to call back unless needed.  [phone]

## 2015-01-25 NOTE — Telephone Encounter (Signed)
LMTCB x1 for pt.  

## 2015-04-17 ENCOUNTER — Other Ambulatory Visit: Payer: Self-pay | Admitting: Family Medicine

## 2015-04-18 ENCOUNTER — Other Ambulatory Visit: Payer: Self-pay | Admitting: *Deleted

## 2015-04-18 MED ORDER — DILTIAZEM HCL ER COATED BEADS 240 MG PO CP24: ORAL_CAPSULE | ORAL | Status: DC

## 2015-04-18 MED ORDER — BENAZEPRIL HCL 20 MG PO TABS: 20.0000 mg | ORAL_TABLET | Freq: Every day | ORAL | Status: DC

## 2015-04-21 ENCOUNTER — Other Ambulatory Visit: Payer: Self-pay | Admitting: Family Medicine

## 2015-04-26 ENCOUNTER — Encounter: Payer: Self-pay | Admitting: *Deleted

## 2015-04-29 ENCOUNTER — Encounter: Payer: Self-pay | Admitting: Cardiovascular Disease

## 2015-04-29 ENCOUNTER — Ambulatory Visit (INDEPENDENT_AMBULATORY_CARE_PROVIDER_SITE_OTHER): Payer: Medicare Other | Admitting: Cardiovascular Disease

## 2015-04-29 VITALS — BP 170/86 | HR 75 | Ht 68.0 in | Wt 371.4 lb

## 2015-04-29 DIAGNOSIS — I4891 Unspecified atrial fibrillation: Secondary | ICD-10-CM

## 2015-04-29 DIAGNOSIS — I10 Essential (primary) hypertension: Secondary | ICD-10-CM

## 2015-04-29 DIAGNOSIS — I5032 Chronic diastolic (congestive) heart failure: Secondary | ICD-10-CM | POA: Diagnosis not present

## 2015-04-29 NOTE — Patient Instructions (Signed)
Medication Instructions:  Your physician recommends that you continue on your current medications as directed. Please refer to the Current Medication list given to you today.   Labwork: None Ordered   Testing/Procedures: None Ordered   Follow-Up: Your physician wants you to follow-up in: 1 year with Dr. Nahser.  You will receive a reminder letter in the mail two months in advance. If you don't receive a letter, please call our office to schedule the follow-up appointment.   If you need a refill on your cardiac medications before your next appointment, please call your pharmacy.   Thank you for choosing [facility]! Pervis Macintyre, RN [phone]    

## 2015-04-29 NOTE — Progress Notes (Signed)
Nancy West  Date of Birth  [birth year]   [address]     [address] [place], [place]  [address] [phone]  Fax  [phone]  Problem List: 1. PVCs 2. Pericardial effusion  3. Paroxysmal Atrial Fibrillation 4. Hypothyroidism 5. Hyperlipidemia 6. Obstuctive sleep apnea 7. COPD  History of Present Illness:  Nancy West is a 69 year old female who is a previous patient of Dr. Reyes Ivan. She has a history of morbid obesity, diastolic dysfunction, hypertension, and cigarette smoking. She presents today for followup of her palpitations. She was hospitalized earlier this summer with pericardial effusion and had a pericardial window.  She has a history of hypertension. Blood pressure has been fairly good and her home meds.   It is a bit elevated today. She has white coat hypertension. She denies any chest pain or shortness breath.  She's doing water aerobics on occasion. 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 Unfortunately, she continues to smoke. We placed an event monitor on her during her last visit. She was found to have numerous premature ventricular contractions. There was no evidence of atrial fibrillation.  Nov. 10, 2014:  [name] is doing well.  She is starting exercising again.  On the treadmill.  She had a pericardial effusion  In June 2013 - s/p pericardial tap   September 15, 2013:  She was recently in the hosptial for Acute on chronic disastolic CHF.         Her Lasix was doubled.  She feels very fatigued and she still has some dyspnea.   She has not smoked for the past several days - but did not commit that she had really stopped.    The echo showed mod-severe RV dilitation and RA dilitation.  No significant tricuspid regurgitation so we cannot determine the estimated PA pressure. She was seen by her medical doctor yesterday. A sleep study has been ordered.   She had a normal stress myoview in 2013: Overall Impression: Normal stress nuclear study.  LV  Ejection Fraction: 56%. LV Wall Motion: NL LV Function; NL Wall Motion  12/25/2011  Sept. 21, 2015:  [name] is doing better.  Has stopped smoking Was diagnosed with OSA and is now on CPAP at night. Also has COPD and wears O2 full time.   Nov. 4, 2016:  [name] is seen for follow up of her HTN and diastolic dysfunction . Has COPD,  Has gained lots of weight .  Weight last year was 312l.  Current weight  Is 371 lbs . (+ 59 lbs)  BP is elevated here.  BP readings are ok at home.    Current Outpatient Prescriptions on File Prior to Visit  Medication Sig Dispense Refill  . Albuterol Sulfate (PROAIR RESPICLICK) 108 (90 BASE) MCG/ACT AEPB Inhale 2 puffs into the lungs every 6 (six) hours as needed. 1 each 5  . aspirin EC 81 MG EC tablet Take 1 tablet (81 mg total) by mouth daily.    Marland Kitchen atorvastatin (LIPITOR) 40 MG tablet Take 40 mg by mouth daily.    Marland Kitchen atorvastatin (LIPITOR) 40 MG tablet TAKE 1 TABLET BY MOUTH EVERY DAY 90 tablet 0  . benazepril (LOTENSIN) 20 MG tablet Take 1 tablet (20 mg total) by mouth daily. 30 tablet 0  . Cholecalciferol (VITAMIN D) 1000 UNITS capsule Take 1,000 Units by mouth daily.     Marland Kitchen diltiazem (CARTIA XT) 240 MG 24 hr capsule TAKE 1 CAPSULE BY MOUTH EVERY DAY 30 capsule 0  .  furosemide (LASIX) 40 MG tablet TAKE 1 TABLET BY MOUTH TWICE DAILY 60 tablet 10  . levothyroxine (SYNTHROID, LEVOTHROID) 50 MCG tablet TAKE 1 TABLET BY MOUTH EVERY DAY 90 tablet 0  . levothyroxine (SYNTHROID, LEVOTHROID) 50 MCG tablet TAKE 1 TABLET BY MOUTH DAILY 90 tablet 0  . metoprolol tartrate (LOPRESSOR) 25 MG tablet TAKE 1 TABLET BY MOUTH TWICE DAILY 60 tablet 11  . nitroGLYCERIN (NITROSTAT) 0.4 MG SL tablet Place 1 tablet (0.4 mg total) under the tongue every 5 (five) minutes as needed for chest pain (up to 3 doses). 25 tablet 3  . omeprazole (PRILOSEC) 20 MG capsule Take 1 capsule (20 mg total) by mouth 2 (two) times daily.    . potassium chloride (K-DUR,KLOR-CON) 10 MEQ tablet Take 10  mEq by mouth daily.     No current facility-administered medications on file prior to visit.    Allergies  Allergen Reactions  . Augmentin [Amoxicillin-Pot Clavulanate]     Abdominal pain    Past Medical History  Diagnosis Date  . Obesity   . Hypertension   . Hyperlipidemia   . Hypothyroidism   . Venous stasis of lower extremity     a. chronic LEE  . Tobacco dependence     a. 46 pack yr hx.  . Chest pain     a. 12/2011 Neg Lexi MV, EF 56%.    . Pericardial effusion     a. Large, s/p pericardiocentesis 01/02/12 yielding fluid (neg malignant cells, only inflammatory cells)  . DOE (dyspnea on exertion)     Chronic  . Anemia     Noted 12/2011  . Atrial fib/flutter, transient     In setting of pericardial effusion 12/2011  . Diastolic CHF (HCC)   . Pneumonia 09/01/2013    "for the first time" (09/01/2013)  . Diabetes mellitus (HCC)     "diet controlled; never been on RX; don't check sugar @ home" (09/01/2013)  . GERD (gastroesophageal reflux disease)   . OSA (obstructive sleep apnea) 11/05/2013    on CPAP  . Obesity hypoventilation syndrome (HCC) 11/05/2013  . Type 2 diabetes, diet controlled [facility])     Past Surgical History  Procedure Laterality Date  . Breast biopsy Right 1980's    a. Benign   . Cholecystectomy  1980's  . Appendectomy  ~ 2008  . Pericardiocentesis  12/2011    yielding fluid (neg malignant cells, only inflammatory cells)/notes 12/30/2011  . Pericardial tap N/A 01/02/2012    Procedure: PERICARDIAL TAP;  Surgeon: Kathleene Hazel, MD;  Location: [facility] CATH LAB;  Service: Cardiovascular;  Laterality: N/A;    History  Smoking status  . Former Smoker -- 50 years  . Types: Cigarettes  . Quit date: 12/16/2013  Smokeless tobacco  . Never Used    Comment: Smokes 5 cigaretts daily    History  Alcohol Use  . 0.0 oz/week  . 0 Standard drinks or equivalent per week    Comment: recently drinking more, drinking a glass a day (1 bottle) for a week,  until she gets back to the store, maybe 2-4 weeks    Family History  Problem Relation Age of Onset  . Breast cancer Mother 34  . Hypertension Mother   . Arthritis Mother   . Anxiety disorder Mother   . Diabetes Father     died in MVA  . Colon cancer Mother   . Healthy Sister     Reviw of Systems:  Reviewed in the HPI.  All other systems are negative.  Physical Exam: BP 170/86 mmHg  Pulse 75  Ht 5\' 8"  (1.727 m)  Wt 371 lb 6.4 oz (168.466 kg)  BMI 56.48 kg/m2 The patient is alert and oriented x 3.  The mood and affect are normal.   Skin: warm and dry.  Color is normal.    HEENT:   the sclera are nonicteric.  The mucous membranes are moist.  The carotids are 2+ without bruits.  There is no thyromegaly.  There is no JVD.    Lungs: Slight wheezing bilaterally  Heart: regular rate with a normal S1 and S2.  There are no murmurs, gallops, or rubs. The PMI is not displaced.     Abdomen: She is morbidly obese. good bowel sounds.  There is no guarding or rebound.  There is no hepatosplenomegaly or tenderness.  There are no masses.   Extremities:  1+ edema but bilaterally.  She has chronic stasis changes.     Neuro:  Cranial nerves II - XII are intact.  Motor and sensory functions are intact.    The gait is normal.  ECG:  Assessment / Plan:   1. PVCs  2. Pericardial effusion  -  Stable   3. Paroxysmal Atrial Fibrillation -  Has maintained NSR  4. Hypothyroidism  5. Hyperlipidemia  6. Obstuctive sleep apnea  7. COPD  8. Morbid obesity :    Jaida Basurto, Deloris Ping, MD  04/29/2015 4:58 PM    [facility] [address] [place],  [address] [place], [place]  [address] Pager [phone] Phone: [phone]; Fax: [phone]   [facility]  [address] [place], [place]  [address] [phone]   Fax [phone]

## 2015-05-04 ENCOUNTER — Other Ambulatory Visit: Payer: Self-pay | Admitting: Cardiovascular Disease

## 2015-05-04 MED ORDER — METOPROLOL TARTRATE 25 MG PO TABS: 25.0000 mg | ORAL_TABLET | Freq: Two times a day (BID) | ORAL | Status: DC

## 2015-05-15 ENCOUNTER — Other Ambulatory Visit: Payer: Self-pay | Admitting: Cardiovascular Disease

## 2015-05-16 NOTE — Telephone Encounter (Signed)
Nancy Mixer, MD at 04/29/2015 3:40 PM  benazepril (LOTENSIN) 20 MG tabletTake 1 tablet (20 mg total) by mouth daily diltiazem (CARTIA XT) 240 MG 24 hr capsuleTAKE 1 CAPSULE BY MOUTH EVERY DAY Medication Instructions:  Your physician recommends that you continue on your current medications as directed. Please refer to the Current Medication list given to you today.

## 2015-05-31 ENCOUNTER — Other Ambulatory Visit: Payer: Self-pay | Admitting: Cardiovascular Disease

## 2015-06-06 ENCOUNTER — Encounter: Payer: Medicare Other | Admitting: Family Medicine

## 2015-06-13 ENCOUNTER — Telehealth: Payer: Self-pay | Admitting: Pulmonary Disease

## 2015-06-13 MED ORDER — AZITHROMYCIN 250 MG PO TABS: ORAL_TABLET | ORAL | Status: DC

## 2015-06-13 MED ORDER — PREDNISONE 10 MG PO TABS: ORAL_TABLET | ORAL | Status: DC

## 2015-06-13 NOTE — Telephone Encounter (Signed)
Prednisone: Take 40mg po daily for 3 days, then take 30mg po daily for 3 days, then take 20mg po daily for two days, then take 10mg po daily for 2 days  Zpack 

## 2015-06-13 NOTE — Telephone Encounter (Signed)
Called and spoke with patient. Informed her of BQ's recs. Verified pharmacy as [facility]. Pt voiced understanding and had no further questions. Meds sent to pharmacy. Nothing further needed.

## 2015-06-13 NOTE — Telephone Encounter (Signed)
Patient has a terrible cold. Chest very tight, full of congestion. Coughing out brownish mucus. No fever.   Pharmacy: [facility] [address]  Allergies  Allergen Reactions  . Augmentin [Amoxicillin-Pot Clavulanate]     Abdominal pain

## 2015-06-14 ENCOUNTER — Other Ambulatory Visit: Payer: Self-pay | Admitting: Cardiovascular Disease

## 2015-06-14 MED ORDER — POTASSIUM CHLORIDE CRYS ER 10 MEQ PO TBCR: 10.0000 meq | EXTENDED_RELEASE_TABLET | Freq: Every day | ORAL | Status: DC

## 2015-06-28 DIAGNOSIS — H353131 Nonexudative age-related macular degeneration, bilateral, early dry stage: Secondary | ICD-10-CM | POA: Diagnosis not present

## 2015-06-28 DIAGNOSIS — H25011 Cortical age-related cataract, right eye: Secondary | ICD-10-CM | POA: Diagnosis not present

## 2015-06-28 DIAGNOSIS — E119 Type 2 diabetes mellitus without complications: Secondary | ICD-10-CM | POA: Diagnosis not present

## 2015-06-28 DIAGNOSIS — H2513 Age-related nuclear cataract, bilateral: Secondary | ICD-10-CM | POA: Diagnosis not present

## 2015-06-28 DIAGNOSIS — H5203 Hypermetropia, bilateral: Secondary | ICD-10-CM | POA: Diagnosis not present

## 2015-06-29 ENCOUNTER — Other Ambulatory Visit: Payer: Medicare Other

## 2015-06-29 ENCOUNTER — Telehealth: Payer: Self-pay | Admitting: *Deleted

## 2015-06-29 DIAGNOSIS — E039 Hypothyroidism, unspecified: Secondary | ICD-10-CM | POA: Diagnosis not present

## 2015-06-29 DIAGNOSIS — E7800 Pure hypercholesterolemia, unspecified: Secondary | ICD-10-CM | POA: Diagnosis not present

## 2015-06-29 DIAGNOSIS — E119 Type 2 diabetes mellitus without complications: Secondary | ICD-10-CM

## 2015-06-29 DIAGNOSIS — I10 Essential (primary) hypertension: Secondary | ICD-10-CM

## 2015-06-29 LAB — LIPID PANEL
Cholesterol: 174 mg/dL (ref 125–200)
HDL: 65 mg/dL (ref >=46)
LDL CALC: 82 mg/dL (ref <130)
TRIGLYCERIDES: 136 mg/dL (ref <150)
Total CHOL/HDL Ratio: 2.7 Ratio (ref <=5.0)
VLDL: 27 mg/dL (ref <30)

## 2015-06-29 LAB — COMPREHENSIVE METABOLIC PANEL
ALT: 15 U/L (ref 6–29)
AST: 16 U/L (ref 10–35)
Albumin: 4 g/dL (ref 3.6–5.1)
Alkaline Phosphatase: 99 U/L (ref 33–130)
BUN: 35 mg/dL — AB (ref 7–25)
CHLORIDE: 104 mmol/L (ref 98–110)
CO2: 26 mmol/L (ref 20–31)
Calcium: 9.4 mg/dL (ref 8.6–10.4)
Creat: 1.27 mg/dL — ABNORMAL HIGH (ref 0.50–0.99)
GLUCOSE: 106 mg/dL — AB (ref 65–99)
POTASSIUM: 4.7 mmol/L (ref 3.5–5.3)
SODIUM: 140 mmol/L (ref 135–146)
Total Bilirubin: 0.7 mg/dL (ref 0.2–1.2)
Total Protein: 7.3 g/dL (ref 6.1–8.1)

## 2015-06-29 LAB — TSH: TSH: 2.054 u[IU]/mL (ref 0.350–4.500)

## 2015-06-29 NOTE — Telephone Encounter (Signed)
Patient was in for labs today, there was not an A1c ordered, felt like you might have wanted one. I did have Jovanka draw in case. Please let me know and I will order if needed.

## 2015-06-29 NOTE — Telephone Encounter (Signed)
Yes, please add--diet controlled DM

## 2015-06-30 LAB — HEMOGLOBIN A1C
Hgb A1c MFr Bld: 6.3 % — ABNORMAL HIGH (ref <5.7)
Mean Plasma Glucose: 134 mg/dL — ABNORMAL HIGH (ref <117)

## 2015-07-04 ENCOUNTER — Encounter: Payer: Self-pay | Admitting: Family Medicine

## 2015-07-04 ENCOUNTER — Ambulatory Visit (INDEPENDENT_AMBULATORY_CARE_PROVIDER_SITE_OTHER): Payer: Medicare Other | Admitting: Family Medicine

## 2015-07-04 VITALS — BP 130/74 | HR 88 | Ht 68.0 in | Wt 358.0 lb

## 2015-07-04 DIAGNOSIS — E7800 Pure hypercholesterolemia, unspecified: Secondary | ICD-10-CM | POA: Diagnosis not present

## 2015-07-04 DIAGNOSIS — Z5181 Encounter for therapeutic drug level monitoring: Secondary | ICD-10-CM | POA: Diagnosis not present

## 2015-07-04 DIAGNOSIS — Z23 Encounter for immunization: Secondary | ICD-10-CM

## 2015-07-04 DIAGNOSIS — E039 Hypothyroidism, unspecified: Secondary | ICD-10-CM | POA: Diagnosis not present

## 2015-07-04 DIAGNOSIS — Z1159 Encounter for screening for other viral diseases: Secondary | ICD-10-CM | POA: Diagnosis not present

## 2015-07-04 DIAGNOSIS — E119 Type 2 diabetes mellitus without complications: Secondary | ICD-10-CM

## 2015-07-04 DIAGNOSIS — E1129 Type 2 diabetes mellitus with other diabetic kidney complication: Secondary | ICD-10-CM | POA: Diagnosis not present

## 2015-07-04 DIAGNOSIS — I10 Essential (primary) hypertension: Secondary | ICD-10-CM | POA: Diagnosis not present

## 2015-07-04 DIAGNOSIS — R809 Proteinuria, unspecified: Secondary | ICD-10-CM | POA: Diagnosis not present

## 2015-07-04 MED ORDER — LEVOTHYROXINE SODIUM 50 MCG PO TABS: 50.0000 ug | ORAL_TABLET | Freq: Every day | ORAL | Status: DC

## 2015-07-04 MED ORDER — ATORVASTATIN CALCIUM 40 MG PO TABS: 40.0000 mg | ORAL_TABLET | Freq: Every day | ORAL | Status: DC

## 2015-07-04 NOTE — Patient Instructions (Signed)
Continue your current medications. Continue to follow a low sodium diet. Try and get at least 30 minutes of exercise daily, even if in small increments. Continue with your weight loss efforts--great job so far.  Please schedule your mammogram and [facility] at [facility]. We will be referring you for Cologard (fecal stool testing). If this is abnormal, you will definitely need the colonoscopy scheduled.

## 2015-07-04 NOTE — Progress Notes (Signed)
Chief Complaint  Patient presents with  . Diabetes    nonfasting med check, labs already done. Would like to discuss predisone. When she takes for acute illness(ie:URI, bronchitis), she feels so much better as far as her breathing and she has no joint pain, just feels better in general.    She is asking about using prednisone longterm--helps her breathing and her joint pains.  Diabetes--Sugars have been 110-120 in the mornings (using her husband's glucometer).  After eating it is about the same (or lower).  She has never been on medication for diabetes, just diet controlled. She checks her feet regularly, and gets regular pedicures. She had diabetic eye exam last week. She has early cataracts and early macular degeneration (no notes yet received).  Hypertension follow-up: Blood pressures at home have been variable. It was 144/66 at home this morning, saw 112 systolic recently also.  Denies dizziness, headaches. Occasional muscle cramps/charlie horses, not nearly as bad as in the past. No edema--She has only been taking lasix once in the morning, and not needing a second dose ever. She recently cut back significantly on sodium intake.  Hyperlipidemia follow-up: Patient states her diet has improved significantly.  Her husband having recent heart surgery has helped both of their diets improved. Cut out salt, fat, processed foods from the diet.  Eating pork and chicken, no red meats.  Compliant with her medications and denies any side effects.  GERD follow-up: Compliant with medications (OTC Prilosec), denies any recurrent symptoms. Denies dysphagia. She takes prilosec just once daily, no longer needing it BID like she was at last visit.   Hypothyroidism: Compliant with medications, taking an hour before all other medications. No skin/hair changes. No bowel changes, energy and moods are normal. +intentional weight loss (still much higher than last year)  Depression--she was very depressed at her last  visit.  She never ended up calling the therapist.  With her husband being sick recently (CABG), she surprised herself with how much she was truly capable of doing, when she had to do it.  She has been feeling much better, stronger, optimistic. She still get some DOE related to her lungs (see below).  Vitamin D deficiency--compliant in taking OTC vitamins. Has been normal when checked multiple times on current regimen.  COPD:  On oxygen all the time now.  Under the care of Dr. Craige Cotta.  Doing well.  Hasn't needed to use albuterol in quite some time.  Breathing is good.  Some dyspnea with walking, but recovers quickly.  OSA--noncompliant with CPAP recently.  Husband having many medical issues, insomnia; she has been sleeping in the living room and doesn't have outlet near her; head is elevated somewhat.  Lost 14# in the last 6 months (still 50# higher than in the past, gained when depressed).  Admits to not scheduling mammogram, DEXA or colonoscopy. Adenomatous polyp in 07/2005.  PMH, PSH, SH and FH were reviewed and updated.  Outpatient Encounter Prescriptions as of 07/04/2015  Medication Sig Note  . aspirin EC 81 MG EC tablet Take 1 tablet (81 mg total) by mouth daily.   Marland Kitchen atorvastatin (LIPITOR) 40 MG tablet TAKE 1 TABLET BY MOUTH EVERY DAY   . benazepril (LOTENSIN) 20 MG tablet TAKE 1 TABLET BY MOUTH DAILY   . CARTIA XT 240 MG 24 hr capsule TAKE 1 CAPSULE BY MOUTH EVERY DAY   . Cholecalciferol (VITAMIN D) 1000 UNITS capsule Take 1,000 Units by mouth daily.    . furosemide (LASIX) 40 MG tablet  TAKE 1 TABLET BY MOUTH TWICE DAILY   . levothyroxine (SYNTHROID, LEVOTHROID) 50 MCG tablet TAKE 1 TABLET BY MOUTH EVERY DAY   . metoprolol tartrate (LOPRESSOR) 25 MG tablet Take 1 tablet (25 mg total) by mouth 2 (two) times daily.   Marland Kitchen omeprazole (PRILOSEC) 20 MG capsule Take 1 capsule (20 mg total) by mouth 2 (two) times daily.   . potassium chloride (K-DUR,KLOR-CON) 10 MEQ tablet Take 1 tablet (10 mEq  total) by mouth daily.   . Albuterol Sulfate (PROAIR RESPICLICK) 108 (90 BASE) MCG/ACT AEPB Inhale 2 puffs into the lungs every 6 (six) hours as needed. (Patient not taking: Reported on 07/04/2015) 07/04/2015: Uses prn only, not needing.  . nitroGLYCERIN (NITROSTAT) 0.4 MG SL tablet Place 1 tablet (0.4 mg total) under the tongue every 5 (five) minutes as needed for chest pain (up to 3 doses). (Patient not taking: Reported on 07/04/2015)   . [DISCONTINUED] azithromycin (ZITHROMAX) 250 MG tablet Take as directed   . [DISCONTINUED] levothyroxine (SYNTHROID, LEVOTHROID) 50 MCG tablet TAKE 1 TABLET BY MOUTH DAILY   . [DISCONTINUED] potassium chloride (K-DUR) 10 MEQ tablet TK 1 T PO QD 07/04/2015: Received from: External Pharmacy  . [DISCONTINUED] predniSONE (DELTASONE) 10 MG tablet Take  po daily for 3 days, then take  po daily for 3 days, then take  po daily for two days, then take  po daily for 2 days    No facility-administered encounter medications on file as of 07/04/2015.   Allergies  Allergen Reactions  . Augmentin [Amoxicillin-Pot Clavulanate]     Abdominal pain   ROS:  No fever, chills, URI or allergy symptoms, no cough.  +DOE as per HPI, stable/unchanged. No chest pain, palpitations, edema. No numbness, tingling, polydipsia, polyuria, dysuria, hematuria, nausea, vomiting, bowel changes, abdominal pain. Denies bleeding, bruising, rash, depression, anxiety/  See HPI.  PHYSICAL EXAM: BP 130/74 mmHg  Pulse 88  Ht  (1.727 m)  Wt 358 lb (162.388 kg)  BMI 54.45 kg/m2 Morbidly obese, pleasant female, using oxygen, speaking easily in full sentences, in no distress HEENT: PERRL EOMI, conjunctiva clear. OP is clear Neck: No lymphadenopathy, thyromegaly or carotid bruit Heart: regular rate and rhythm Lungs: clear bilaterally Back: no spinal or CVA tenderness Abdomen: obese, soft, nontender, no mass Extremities: Trace edema in ankles Skin: normal turgor, no rash/bruising Psych:  normal mood, affect, hygiene and grooming   Lab Results  Component Value Date   HGBA1C 6.3* 06/29/2015   Lab Results  Component Value Date   TSH 2.054 06/29/2015   Lab Results  Component Value Date   CHOL 174 06/29/2015   HDL 65 06/29/2015   LDLCALC 82 06/29/2015   TRIG 136 06/29/2015   CHOLHDL 2.7 06/29/2015     Chemistry      Component Value Date/Time   NA 140 06/29/2015 0001   K 4.7 06/29/2015 0001   CL 104 06/29/2015 0001   CO2 26 06/29/2015 0001   BUN 35* 06/29/2015 0001   CREATININE 1.27* 06/29/2015 0001   CREATININE 1.41* 09/05/2013 0511      Component Value Date/Time   CALCIUM 9.4 06/29/2015 0001   ALKPHOS 99 06/29/2015 0001   AST 16 06/29/2015 0001   ALT 15 06/29/2015 0001   BILITOT 0.7 06/29/2015 0001     Fasting glucose 106  ASSESSMENT/PLAN:  Pure hypercholesterolemia - at goal on current regimen, continue - Plan: atorvastatin (LIPITOR) 40 MG tablet  Need for prophylactic vaccination and inoculation against influenza - Plan: Flu vaccine  HIGH DOSE PF (Fluzone High dose)  Hypothyroidism, unspecified hypothyroidism type - adequately replaced on current dose; continue - Plan: levothyroxine (SYNTHROID, LEVOTHROID) 50 MCG tablet  Essential hypertension, benign - controlled; intermittently high at home. continue low sodium diet, exerise, weight loss, current meds  Diet-controlled diabetes mellitus (HCC) - reassured re: home sugars, goals reviewed. Diet controlled  OSA--Encouraged to use CPAP regularly.  Discussed risks/benefits and reasons for mammogram and DEXA (especially given frequent prednisone use) Discussed risks and side effects of prednisone, reasons that long-term use isn't recommended  Discussed colonoscopy and cologard.  Willing to do cologard, and colonoscopy if abnormal. Referral done.  Elevated Creatinine--overall stable, has been higher in the past.  Poss pre-renal component. Will continue to monitor.  Avoid NSAIDs if possible. Ensure  adequate fluid intake.  F/u 6 months for med check+/AWV, sooner prn. Fasting labs prior to visit.

## 2015-07-25 DIAGNOSIS — Z1212 Encounter for screening for malignant neoplasm of rectum: Secondary | ICD-10-CM | POA: Diagnosis not present

## 2015-07-25 DIAGNOSIS — Z1211 Encounter for screening for malignant neoplasm of colon: Secondary | ICD-10-CM | POA: Diagnosis not present

## 2015-07-25 LAB — COLOGUARD: COLOGUARD: NEGATIVE

## 2015-07-28 ENCOUNTER — Ambulatory Visit: Payer: PRIVATE HEALTH INSURANCE | Admitting: Pulmonary Disease

## 2015-08-11 ENCOUNTER — Encounter: Payer: Self-pay | Admitting: *Deleted

## 2015-10-31 ENCOUNTER — Telehealth: Payer: Self-pay | Admitting: Pulmonary Disease

## 2015-10-31 ENCOUNTER — Ambulatory Visit (INDEPENDENT_AMBULATORY_CARE_PROVIDER_SITE_OTHER): Payer: Medicare Other | Admitting: Pulmonary Disease

## 2015-10-31 ENCOUNTER — Ambulatory Visit (INDEPENDENT_AMBULATORY_CARE_PROVIDER_SITE_OTHER)
Admission: RE | Admit: 2015-10-31 | Discharge: 2015-10-31 | Disposition: A | Discharge status: Home / Self Care | Payer: Medicare Other | Source: Ambulatory Visit | Attending: Pulmonary Disease | Admitting: Pulmonary Disease

## 2015-10-31 ENCOUNTER — Encounter: Payer: Self-pay | Admitting: Pulmonary Disease

## 2015-10-31 VITALS — BP 140/78 | HR 60 | Ht 68.0 in | Wt 371.0 lb

## 2015-10-31 DIAGNOSIS — J9611 Chronic respiratory failure with hypoxia: Secondary | ICD-10-CM | POA: Diagnosis not present

## 2015-10-31 DIAGNOSIS — J449 Chronic obstructive pulmonary disease, unspecified: Secondary | ICD-10-CM

## 2015-10-31 DIAGNOSIS — J4489 Other specified chronic obstructive pulmonary disease: Secondary | ICD-10-CM

## 2015-10-31 DIAGNOSIS — R0602 Shortness of breath: Secondary | ICD-10-CM | POA: Diagnosis not present

## 2015-10-31 MED ORDER — BUDESONIDE-FORMOTEROL FUMARATE 160-4.5 MCG/ACT IN AERO: 2.0000 | INHALATION_SPRAY | Freq: Two times a day (BID) | RESPIRATORY_TRACT | Status: DC

## 2015-10-31 NOTE — Patient Instructions (Signed)
Chest xray today  Symbicort two puffs twice per day >> rinse mouth after each use  Will change home oxygen set up to 3 liters at rest, and 4 liters with exertion  Follow up in 6 weeks with Dr. Craige Cotta or Nurse Practitioner

## 2015-10-31 NOTE — Telephone Encounter (Signed)
Dg Chest 2 View  10/31/2015  CLINICAL DATA:  Progressive shortness of breath, former smoker, hypertension, type II diabetes mellitus, diastolic CHF EXAM: CHEST  2 VIEW COMPARISON:  09/01/2013 FINDINGS: Enlargement of cardiac silhouette. Prominent central pulmonary arteries again identified question pulmonary arterial hypertension. Mediastinal contours normal with atherosclerotic calcification aorta noted. Lungs clear. No acute infiltrate, pleural effusion or pneumothorax. Bones demineralized. IMPRESSION: Enlargement of cardiac silhouette. Question pulmonary arterial hypertension. No acute abnormalities. Electronically Signed   By: Ulyses Southward M.D.   On: 10/31/2015 15:46    Will have my nurse inform pt that CXR didn't show any signs of pneumonia.  No change to tx plan from 10/31/15.

## 2015-10-31 NOTE — Telephone Encounter (Signed)
Results have been explained to patient, pt expressed understanding. Nothing further needed.  

## 2015-10-31 NOTE — Progress Notes (Signed)
Current Outpatient Prescriptions on File Prior to Visit  Medication Sig  . Albuterol Sulfate (PROAIR RESPICLICK) 108 (90 BASE) MCG/ACT AEPB Inhale 2 puffs into the lungs every 6 (six) hours as needed.  Marland Kitchen aspirin EC 81 MG EC tablet Take 1 tablet (81 mg total) by mouth daily.  Marland Kitchen atorvastatin (LIPITOR) 40 MG tablet Take 1 tablet (40 mg total) by mouth daily.  . benazepril (LOTENSIN) 20 MG tablet TAKE 1 TABLET BY MOUTH DAILY  . CARTIA XT 240 MG 24 hr capsule TAKE 1 CAPSULE BY MOUTH EVERY DAY  . Cholecalciferol (VITAMIN D) 1000 UNITS capsule Take 1,000 Units by mouth daily.   . furosemide (LASIX) 40 MG tablet TAKE 1 TABLET BY MOUTH TWICE DAILY  . levothyroxine (SYNTHROID, LEVOTHROID) 50 MCG tablet Take 1 tablet (50 mcg total) by mouth daily.  . metoprolol tartrate (LOPRESSOR) 25 MG tablet Take 1 tablet (25 mg total) by mouth 2 (two) times daily.  Marland Kitchen omeprazole (PRILOSEC) 20 MG capsule Take 1 capsule (20 mg total) by mouth 2 (two) times daily.  . potassium chloride (K-DUR,KLOR-CON) 10 MEQ tablet Take 1 tablet (10 mEq total) by mouth daily.   No current facility-administered medications on file prior to visit.    Tests CT chest 09/01/13 >> patchy ATX  Echo 09/02/13 >> EF 55 to 65%, grade 2 diastolic dysfx, mod LA dilation, mod/severe RV dilation  PSG 10/21/13 >> AHI 22.2, SpO2 low 80%  RA SaO2 at rest 11/05/13 >> 81%; start 2 liters oxygen 24/7 PFT 12/21/13 >> FEV1 1.22 (48%) FEV1% 73, TLC 3.97 (75%), DLCO 57%, no BD CPAP titration 12/16/13 >> CPAP 6 >> AHI 0. No REM, disrupted sleep. Auto CPAP 10/03/14 to 11/01/14 >> used on 26 of 30 nights with average 7 hrs and 27 min.  Average AHI is 1.4 with median CPAP 7 cm H2O and 95 th percentile CPAP 10 cm H20.  Past medical history HTN, HLD, Hypothyroidism, A fib/flutter, Diastolic CHF, DM, GERD  Past surgical history, Family history, Social history, Allergies reviewed  Vital signs BP 140/78 mmHg  Pulse 60  Ht 5\' 8"  (1.727 m)  Wt 371 lb (168.284 kg)   BMI 56.42 kg/m2  SpO2 90%   History of Present Illness: Nancy West is a 70 y.o. female former smoker with with COPD chronic bronchitis, OSA, OHS, and chronic respiratory failure.  She has notice more trouble with her breathing.  She gets wheezing episodes >> she feels better when she is given prednisone.  She is having some cough with clear sputum.  It is hard for her to keep up with activities due to dyspnea and joint pain.  She has not been able to use her CPAP for several months.  Her husband had CABG and had several medical equipment pieces in their bedroom.  She had to sleep on the couch, and there wasn't a convenient location to put her CPAP.  She had to increase her oxygen to 3 liters >> she had oxygen desaturation with 3 liters on ambulation after one lap.  Informed CMA that this was longest distance she has walked in almost 1 year.  Physical Exam:  General - No distress, wearing oxygen ENT - No sinus tenderness, no oral exudate, no LAN Cardiac - s1s2 regular, no murmur Chest - faint wheeze mostly in right upper lugn Back - No focal tenderness Abd - Soft, non-tender Ext - No edema Neuro - Normal strength Skin - No rashes Psych - normal mood, and behavior  Assessment/Plan:  COPD with chronic bronchitis. - resume symbicort - continue prn albuterol - consider pulmonary rehab referral at her next visit  Obstructive sleep apnea. - advised her to resume auto CPAP  Chronic respiratory failure with hypoxia 2nd to obesity hypoventilation syndrome, and COPD. - will continue 3 liters oxygen at rest - she will need to increase to 4 liters with exertion  Dyspnea. - will get chest xray today   Patient Instructions  Chest xray today  Symbicort two puffs twice per day >> rinse mouth after each use  Will change home oxygen set up to 3 liters at rest, and 4 liters with exertion  Follow up in 6 weeks with Dr. Craige Cotta or Nurse Practitioner    Coralyn Helling, MD [facility]  Pulmonary/Critical Care/Sleep Pager:  [phone] 10/31/2015, 12:37 PM

## 2015-12-12 ENCOUNTER — Ambulatory Visit: Payer: Medicare Other | Admitting: Acute Care

## 2015-12-19 ENCOUNTER — Encounter: Payer: Self-pay | Admitting: Pulmonary Disease

## 2015-12-19 ENCOUNTER — Ambulatory Visit (INDEPENDENT_AMBULATORY_CARE_PROVIDER_SITE_OTHER): Payer: Medicare Other | Admitting: Pulmonary Disease

## 2015-12-19 VITALS — BP 140/78 | HR 78 | Ht 68.0 in | Wt 376.0 lb

## 2015-12-19 DIAGNOSIS — J449 Chronic obstructive pulmonary disease, unspecified: Secondary | ICD-10-CM | POA: Diagnosis not present

## 2015-12-19 DIAGNOSIS — G4733 Obstructive sleep apnea (adult) (pediatric): Secondary | ICD-10-CM

## 2015-12-19 DIAGNOSIS — J9611 Chronic respiratory failure with hypoxia: Secondary | ICD-10-CM | POA: Diagnosis not present

## 2015-12-19 DIAGNOSIS — Z9989 Dependence on other enabling machines and devices: Secondary | ICD-10-CM

## 2015-12-19 NOTE — Patient Instructions (Signed)
1.  Continue to wear you CPAP mask at night  2.  Oxygen 3L at rest, 4L with exertion 3.  Follow up with Dr. Craige Cotta in 6 months or sooner if needed.  4.  We will need to get a download of your CPAP machine for review of your settings

## 2015-12-19 NOTE — Progress Notes (Signed)
Current Outpatient Prescriptions on File Prior to Visit  Medication Sig  . Albuterol Sulfate (PROAIR RESPICLICK) 108 (90 BASE) MCG/ACT AEPB Inhale 2 puffs into the lungs every 6 (six) hours as needed.  Marland Kitchen aspirin EC 81 MG EC tablet Take 1 tablet (81 mg total) by mouth daily.  Marland Kitchen atorvastatin (LIPITOR) 40 MG tablet Take 1 tablet (40 mg total) by mouth daily.  . benazepril (LOTENSIN) 20 MG tablet TAKE 1 TABLET BY MOUTH DAILY  . budesonide-formoterol (SYMBICORT) 160-4.5 MCG/ACT inhaler Inhale 2 puffs into the lungs 2 (two) times daily.  Marland Kitchen CARTIA XT 240 MG 24 hr capsule TAKE 1 CAPSULE BY MOUTH EVERY DAY  . Cholecalciferol (VITAMIN D) 1000 UNITS capsule Take 1,000 Units by mouth daily.   . furosemide (LASIX) 40 MG tablet TAKE 1 TABLET BY MOUTH TWICE DAILY  . levothyroxine (SYNTHROID, LEVOTHROID) 50 MCG tablet Take 1 tablet (50 mcg total) by mouth daily.  . metoprolol tartrate (LOPRESSOR) 25 MG tablet Take 1 tablet (25 mg total) by mouth 2 (two) times daily.  Marland Kitchen omeprazole (PRILOSEC) 20 MG capsule Take 1 capsule (20 mg total) by mouth 2 (two) times daily.  . potassium chloride (K-DUR,KLOR-CON) 10 MEQ tablet Take 1 tablet (10 mEq total) by mouth daily.   No current facility-administered medications on file prior to visit.     Chief Complaint  Patient presents with  . Follow-up    Pt states that her breathing has been doing well since O2 was increased to 3 liters at rest and 4 liters with exertion. Pt states that the 4 Liters seems to work good.      Tests CT chest 09/01/13 >> patchy ATX  Echo 09/02/13 >> EF 55 to 65%, grade 2 diastolic dysfx, mod LA dilation, mod/severe RV dilation  PSG 10/21/13 >> AHI 22.2, SpO2 low 80%  RA SaO2 at rest 11/05/13 >> 81%; start 2 liters oxygen 24/7 PFT 12/21/13 >> FEV1 1.22 (48%) FEV1% 73, TLC 3.97 (75%), DLCO 57%, no BD CPAP titration 12/16/13 >> CPAP 6 >> AHI 0. No REM, disrupted sleep. Auto CPAP 10/03/14 to 11/01/14 >> used on 26 of 30 nights with average 7 hrs  and 27 min. Average AHI is 1.4 with median CPAP 7 cm H2O and 95 th percentile CPAP 10 cm H20.   Past medical hx  has a past medical history of Obesity; Hypertension; Hyperlipidemia; Hypothyroidism; Venous stasis of lower extremity; Tobacco dependence; Chest pain; Pericardial effusion; DOE (dyspnea on exertion); Anemia; Atrial fib/flutter, transient; Diastolic CHF (HCC); Pneumonia (09/01/2013); Diabetes mellitus (HCC); GERD (gastroesophageal reflux disease); OSA (obstructive sleep apnea) (11/05/2013); Obesity hypoventilation syndrome (HCC) (11/05/2013); and Type 2 diabetes, diet controlled (HCC).   Past surgical hx, Allergies, Family hx, Social hx all reviewed.  Vital Signs BP 140/78 mmHg  Pulse 78  Ht 5\' 8"  (1.727 m)  Wt 376 lb (170.552 kg)  BMI 57.18 kg/m2  SpO2 93%  History of Present Illness Nancy West is a 70 y.o. female, former smoker, with a PMH of morbid obesity (with recent weight gain), HTN, HLD, hypothyroidism, anemia, atrial flutter, dCHF, DM II, GERD, OSA / OHS and COPD who presented to the pulmonary office on 6/27 for routine follow up.    OSA >> changed sleeping arangements last year due to husbands illness (now better after quadruple bypass surgery) not wearing machine, sleeps on couch, off over a year.  Last year weighed 361 >> 376.  She reports it is "too much hassle to hook the machine up".  COPD >> increased to 4L with activity, 3L resting. Better energy levels, feels like she can get out more  She goes out to play cards, goes out with the ladies for the day, baseball game with sister.  Generally not out much excpet for nails, hair or MD appts.    Can walk 20 yards, walked from parking lot to office and did well.  Waiting on portable O2 concentrator from [facility].  NO SOB at rest.  Exertional SOB.    Denies acute complaints.  Reports she feels she is doing well.  Requesting handicapped parking sign.     Physical Exam  General - well developed adult in no acute  distress, sitting on rollator walker ENT - No sinus tenderness, no oral exudate, no LAN Cardiac - s1s2 regular, no murmur Chest - even/non-labored, lungs bilaterally clear. No wheeze/rales Back - No focal tenderness Abd - Soft, non-tender Ext - Trace BLE edema, darkening of skin consistent with chronic venous stasis  Neuro - Normal strength Skin - No rashes Psych - normal mood, and behavior   Assessment/Plan  COPD   Plan: Medications reviewed with patient, no refills needed Continue Symbicort, PRN albuterol   Chronic Hypoxic Respiratory Failure  Plan: Continue O2 3L at rest, 4L with exertion  Encouraged physical activity to promote dyspnea tolerance / overall health   OSA / OHS   Plan: Continue CPAP  She may need download and adjustment of settings with weight gain (15lbs in last year) Reviewed importance of CPAP use with patient, including physiology of importance of wearing mask   Patient Instructions  1.  Continue to wear you CPAP mask at night  2.  Oxygen 3L at rest, 4L with exertion 3.  Follow up with Dr. Craige Cotta in 6 months or sooner if needed.  4.  We will need to get a download of your CPAP machine for review of your settings     Canary Brim, NP-C [facility] Pulmonary & Critical Care Office  [phone] 12/19/2015, 12:28 PM

## 2015-12-23 NOTE — Progress Notes (Signed)
Reviewed and agree with assessment/plan.  Coralyn Helling, MD [facility] Pulmonary/Critical Care 12/23/2015, 8:59 PM Pager:  [phone]

## 2015-12-24 ENCOUNTER — Other Ambulatory Visit: Payer: Self-pay | Admitting: Family Medicine

## 2016-01-02 ENCOUNTER — Other Ambulatory Visit: Payer: Medicare Other

## 2016-01-03 ENCOUNTER — Other Ambulatory Visit: Payer: Medicare Other

## 2016-01-03 ENCOUNTER — Other Ambulatory Visit: Payer: Self-pay | Admitting: Family Medicine

## 2016-01-03 DIAGNOSIS — R809 Proteinuria, unspecified: Secondary | ICD-10-CM | POA: Diagnosis not present

## 2016-01-03 DIAGNOSIS — Z1159 Encounter for screening for other viral diseases: Secondary | ICD-10-CM

## 2016-01-03 DIAGNOSIS — E7800 Pure hypercholesterolemia, unspecified: Secondary | ICD-10-CM | POA: Diagnosis not present

## 2016-01-03 DIAGNOSIS — I10 Essential (primary) hypertension: Secondary | ICD-10-CM

## 2016-01-03 DIAGNOSIS — D649 Anemia, unspecified: Secondary | ICD-10-CM | POA: Diagnosis not present

## 2016-01-03 DIAGNOSIS — Z5181 Encounter for therapeutic drug level monitoring: Secondary | ICD-10-CM | POA: Diagnosis not present

## 2016-01-03 DIAGNOSIS — E119 Type 2 diabetes mellitus without complications: Secondary | ICD-10-CM

## 2016-01-03 DIAGNOSIS — E1129 Type 2 diabetes mellitus with other diabetic kidney complication: Secondary | ICD-10-CM | POA: Diagnosis not present

## 2016-01-03 LAB — CBC WITH DIFFERENTIAL/PLATELET
BASOS PCT: 0 %
Basophils Absolute: 0 cells/uL (ref 0–200)
EOS ABS: 162 {cells}/uL (ref 15–500)
Eosinophils Relative: 2 %
HEMATOCRIT: 32.3 % — AB (ref 35.0–45.0)
HEMOGLOBIN: 10.7 g/dL — AB (ref 11.7–15.5)
LYMPHS ABS: 1296 {cells}/uL (ref 850–3900)
LYMPHS PCT: 16 %
MCH: 29.1 pg (ref 27.0–33.0)
MCHC: 33.1 g/dL (ref 32.0–36.0)
MCV: 87.8 fL (ref 80.0–100.0)
MONO ABS: 486 {cells}/uL (ref 200–950)
MPV: 9.6 fL (ref 7.5–12.5)
Monocytes Relative: 6 %
NEUTROS ABS: 6156 {cells}/uL (ref 1500–7800)
Neutrophils Relative %: 76 %
Platelets: 185 10*3/uL (ref 140–400)
RBC: 3.68 MIL/uL — AB (ref 3.80–5.10)
RDW: 13.7 % (ref 11.0–15.0)
WBC: 8.1 10*3/uL (ref 4.0–10.5)

## 2016-01-03 LAB — LIPID PANEL
Cholesterol: 172 mg/dL (ref 125–200)
HDL: 64 mg/dL (ref >=46)
LDL Cholesterol: 88 mg/dL (ref <130)
TRIGLYCERIDES: 101 mg/dL (ref <150)
Total CHOL/HDL Ratio: 2.7 Ratio (ref <=5.0)
VLDL: 20 mg/dL (ref <30)

## 2016-01-03 LAB — HEMOGLOBIN A1C
Hgb A1c MFr Bld: 6 % — ABNORMAL HIGH (ref <5.7)
Mean Plasma Glucose: 126 mg/dL

## 2016-01-03 LAB — COMPREHENSIVE METABOLIC PANEL
ALBUMIN: 4.1 g/dL (ref 3.6–5.1)
ALT: 10 U/L (ref 6–29)
AST: 11 U/L (ref 10–35)
Alkaline Phosphatase: 98 U/L (ref 33–130)
BUN: 33 mg/dL — ABNORMAL HIGH (ref 7–25)
CALCIUM: 9.1 mg/dL (ref 8.6–10.4)
CHLORIDE: 100 mmol/L (ref 98–110)
CO2: 29 mmol/L (ref 20–31)
Creat: 1.32 mg/dL — ABNORMAL HIGH (ref 0.50–0.99)
Glucose, Bld: 105 mg/dL — ABNORMAL HIGH (ref 65–99)
POTASSIUM: 5 mmol/L (ref 3.5–5.3)
Sodium: 141 mmol/L (ref 135–146)
TOTAL PROTEIN: 7.1 g/dL (ref 6.1–8.1)
Total Bilirubin: 0.6 mg/dL (ref 0.2–1.2)

## 2016-01-04 LAB — VITAMIN B12: Vitamin B-12: 378 pg/mL (ref 200–1100)

## 2016-01-04 LAB — FOLATE: Folate: 7.2 ng/mL (ref >5.4)

## 2016-01-04 LAB — MICROALBUMIN / CREATININE URINE RATIO
CREATININE, URINE: 21 mg/dL (ref 20–320)
Microalb Creat Ratio: 133 mcg/mg creat — ABNORMAL HIGH (ref <30)
Microalb, Ur: 2.8 mg/dL

## 2016-01-04 LAB — HEPATITIS C ANTIBODY: HCV Ab: NEGATIVE

## 2016-01-05 ENCOUNTER — Telehealth: Payer: Self-pay | Admitting: Family Medicine

## 2016-01-05 ENCOUNTER — Ambulatory Visit: Payer: Medicare Other | Admitting: Family Medicine

## 2016-01-05 NOTE — Telephone Encounter (Signed)
She was last seen by me in 06/2015.  Okay to keep the medcheck+/AWV for February, but she needs to reschedule a med check for next week (or as soon as she can). I can refill short-term whatever she needs only until her med check. Let us know which med needs refill

## 2016-01-05 NOTE — Telephone Encounter (Addendum)
Pt called stating that she is having a really bad day and will not be able to get out of the house for her appt this afternoon for her [facility] so she rescheduled. She wanted to make sure that she would still be able to get med refills as needed since she had to schedule [facility] in February 2018 (and she was placed on cancellation list). Pt said she thinks she may need a refill now that was going to be refilled at [facility] today but pt can not think clearly today due to not feeling well & not sure what meds may be needed.

## 2016-01-06 NOTE — Telephone Encounter (Signed)
Pt made [facility] appt for 01/17/13 and she thinks she has enough meds to last until that appt

## 2016-01-17 NOTE — Progress Notes (Deleted)
Diabetes--Sugars have been 110-120 in the mornings (using her husband's glucometer).  After eating it is about the same (or lower).  She has never been on medication for diabetes, just diet controlled. She checks her feet regularly, and gets regular pedicures. She had diabetic eye exam last week. She has early cataracts and early macular degeneration (no notes yet received).  Hypertension follow-up: Blood pressures at home have been variable. It was 144/66 at home this morning, saw 112 systolic recently also.  Denies dizziness, headaches. Occasional muscle cramps/charlie horses, not nearly as bad as in the past. No edema--She has only been taking lasix once in the morning, and not needing a second dose ever. She recently cut back significantly on sodium intake.  Hyperlipidemia follow-up: Patient states her diet has improved significantly.  Her husband having recent heart surgery has helped both of their diets improved. Cut out salt, fat, processed foods from the diet.  Eating pork and chicken, no red meats.  Compliant with her medications and denies any side effects.  GERD follow-up: Compliant with medications (OTC Prilosec), denies any recurrent symptoms. Denies dysphagia. She takes prilosec just once daily, no longer needing it BID like she was at last visit.   Hypothyroidism: Compliant with medications, taking an hour before all other medications. No skin/hair changes. No bowel changes, energy and moods are normal. Lab Results  Component Value Date   TSH 2.054 06/29/2015    Depression--she was very depressed at her last visit.  She never ended up calling the therapist.  With her husband being sick recently (CABG), she surprised herself with how much she was truly capable of doing, when she had to do it.  She has been feeling much better, stronger, optimistic. She still get some DOE related to her lungs (see below).  Vitamin D deficiency--compliant in taking OTC vitamins. Has been  normal when checked multiple times on current regimen.  COPD:  On oxygen all the time now.  Under the care of Dr. Craige Cotta.  Doing well.  Hasn't needed to use albuterol in quite some time.  Breathing is good.  Some dyspnea with walking, but recovers quickly.  OSA--noncompliant with CPAP recently.  Husband having many medical issues, insomnia; she has been sleeping in the living room and doesn't have outlet near her; head is elevated somewhat.   Admits to not scheduling mammogram, or DEXA. H/o adenomatous polyp in 07/2005. Had negative Cologard in 06/2015.    ROS:  No fever, chills, URI or allergy symptoms, no cough.  +DOE as per HPI, stable/unchanged. No chest pain, palpitations, edema. No numbness, tingling, polydipsia, polyuria, dysuria, hematuria, nausea, vomiting, bowel changes, abdominal pain. Denies bleeding, bruising, rash, depression, anxiety/  See HPI.  PHYSICAL EXAM:  (weight in January was 358)    Morbidly obese, pleasant female, using oxygen, speaking easily in full sentences, in no distress HEENT: PERRL EOMI, conjunctiva clear. OP is clear Neck: No lymphadenopathy, thyromegaly or carotid bruit Heart: regular rate and rhythm Lungs: clear bilaterally Back: no spinal or CVA tenderness Abdomen: obese, soft, nontender, no mass Extremities: Trace edema in ankles Skin: normal turgor, no rash/bruising Psych: normal mood, affect, hygiene and grooming Neuro: alert and oriented, cranial nerves intact, normal gait   Lab Results  Component Value Date   HGBA1C 6.0 (H) 01/03/2016     Chemistry      Component Value Date/Time   NA 141 01/03/2016 1109   K 5.0 01/03/2016 1109   CL 100 01/03/2016 1109   CO2 29  01/03/2016 1109   BUN 33 (H) 01/03/2016 1109   CREATININE 1.32 (H) 01/03/2016 1109      Component Value Date/Time   CALCIUM 9.1 01/03/2016 1109   ALKPHOS 98 01/03/2016 1109   AST 11 01/03/2016 1109   ALT 10 01/03/2016 1109   BILITOT 0.6 01/03/2016 1109     Fasting  glucose 105  Lab Results  Component Value Date   CHOL 172 01/03/2016   HDL 64 01/03/2016   LDLCALC 88 01/03/2016   TRIG 101 01/03/2016   CHOLHDL 2.7 01/03/2016    Lab Results  Component Value Date   WBC 8.1 01/03/2016   HGB 10.7 (L) 01/03/2016   HCT 32.3 (L) 01/03/2016   MCV 87.8 01/03/2016   PLT 185 01/03/2016   Lab Results  Component Value Date   VITAMINB12 378 01/03/2016   Normal folate level  Urine microalbumin/Cr ratio elevated at 133  Negative Hepatitis C Ab   ASSESSMENT/PLAN:

## 2016-01-18 ENCOUNTER — Encounter: Payer: Medicare Other | Admitting: Family Medicine

## 2016-01-23 ENCOUNTER — Telehealth: Payer: Self-pay | Admitting: Pulmonary Disease

## 2016-01-23 DIAGNOSIS — J449 Chronic obstructive pulmonary disease, unspecified: Secondary | ICD-10-CM

## 2016-01-23 NOTE — Telephone Encounter (Signed)
Spoke with the pt  She is requesting to switch DME companies  She is frustrated with [employer] b/c they still have not given her a POC that will go up to 4lpm with exertion  We had ordered this back in May and she states she has been getting the run around since then  I will send order to [facility] requesting new DME per pt's request

## 2016-01-25 DIAGNOSIS — E1129 Type 2 diabetes mellitus with other diabetic kidney complication: Secondary | ICD-10-CM | POA: Insufficient documentation

## 2016-01-25 DIAGNOSIS — R809 Proteinuria, unspecified: Secondary | ICD-10-CM

## 2016-01-25 NOTE — Progress Notes (Deleted)
Diabetes--Sugars have been 110-120 in the mornings (using her husband's glucometer).  After eating it is about the same (or lower).  She has never been on medication for diabetes, just diet controlled. She checks her feet regularly, and gets regular pedicures. She had diabetic eye exam last week. She has early cataracts and early macular degeneration (no notes yet received).  Hypertension follow-up: Blood pressures at home have been variable. It was 144/66 at home this morning, saw 112 systolic recently also.  Denies dizziness, headaches. Occasional muscle cramps/charlie horses, not nearly as bad as in the past. No edema--She has only been taking lasix once in the morning, and not needing a second dose ever. She recently cut back significantly on sodium intake.  Hyperlipidemia follow-up: Patient states her diet has improved significantly.  Her husband having recent heart surgery has helped both of their diets improved. Cut out salt, fat, processed foods from the diet.  Eating pork and chicken, no red meats.  Compliant with her medications and denies any side effects.  GERD follow-up: Compliant with medications (OTC Prilosec), denies any recurrent symptoms. Denies dysphagia. She takes prilosec just once daily, no longer needing it BID like she was at last visit.   Hypothyroidism: Compliant with medications, taking an hour before all other medications. No skin/hair changes. No bowel changes, energy and moods are normal. +intentional weight loss (still much higher than last year)  Depression-- (improved, per visit in January)   Vitamin D deficiency--compliant in taking OTC vitamins. Has been normal when checked multiple times on current regimen.  COPD:  On oxygen all the time now.  Under the care of Dr. Craige Cotta.  Doing well.  Hasn't needed to use albuterol in quite some time.  Breathing is good.  Some dyspnea with walking, but recovers quickly.  OSA--noncompliant with CPAP recently.   Husband having many medical issues, insomnia; she has been sleeping in the living room and doesn't have outlet near her; head is elevated somewhat.  Lost 14# in the last 6 months (still 50# higher than in the past, gained when depressed).  Admits to not scheduling mammogram, DEXA. Adenomatous polyp in 07/2005. Had negative Cologard in 06/2015.    ROS:  No fever, chills, URI or allergy symptoms, no cough.  +DOE as per HPI, stable/unchanged. No chest pain, palpitations, edema. No numbness, tingling, polydipsia, polyuria, dysuria, hematuria, nausea, vomiting, bowel changes, abdominal pain. Denies bleeding, bruising, rash, depression, anxiety/  See HPI.   PHYSICAL EXAM:    Morbidly obese, pleasant female, using oxygen, speaking easily in full sentences, in no distress HEENT: PERRL EOMI, conjunctiva clear. OP is clear Neck: No lymphadenopathy, thyromegaly or carotid bruit Heart: regular rate and rhythm Lungs: clear bilaterally Back: no spinal or CVA tenderness Abdomen: obese, soft, nontender, no mass Extremities: Trace edema in ankles Skin: normal turgor, no rash/bruising Psych: normal mood, affect, hygiene and grooming   Lab Results  Component Value Date   HGBA1C 6.0 (H) 01/03/2016   Fasting glucose 105   Chemistry      Component Value Date/Time   NA 141 01/03/2016 1109   K 5.0 01/03/2016 1109   CL 100 01/03/2016 1109   CO2 29 01/03/2016 1109   BUN 33 (H) 01/03/2016 1109   CREATININE 1.32 (H) 01/03/2016 1109      Component Value Date/Time   CALCIUM 9.1 01/03/2016 1109   ALKPHOS 98 01/03/2016 1109   AST 11 01/03/2016 1109   ALT 10 01/03/2016 1109   BILITOT 0.6 01/03/2016 1109  Lab Results  Component Value Date   WBC 8.1 01/03/2016   HGB 10.7 (L) 01/03/2016   HCT 32.3 (L) 01/03/2016   MCV 87.8 01/03/2016   PLT 185 01/03/2016   Lab Results  Component Value Date   CHOL 172 01/03/2016   HDL 64 01/03/2016   LDLCALC 88 01/03/2016   TRIG 101 01/03/2016    CHOLHDL 2.7 01/03/2016   Lab Results  Component Value Date   VITAMINB12 378 01/03/2016   Lab Results  Component Value Date   FOLATE 7.2 01/03/2016   Urine microalbumin/Cr ratio elevated at 133  Hepatitis C Ab negative

## 2016-01-26 ENCOUNTER — Encounter: Payer: Medicare Other | Admitting: Family Medicine

## 2016-01-30 ENCOUNTER — Other Ambulatory Visit: Payer: Self-pay | Admitting: Family Medicine

## 2016-01-30 DIAGNOSIS — E7800 Pure hypercholesterolemia, unspecified: Secondary | ICD-10-CM

## 2016-02-01 ENCOUNTER — Other Ambulatory Visit: Payer: Self-pay | Admitting: Cardiovascular Disease

## 2016-02-16 ENCOUNTER — Ambulatory Visit (INDEPENDENT_AMBULATORY_CARE_PROVIDER_SITE_OTHER): Payer: Medicare Other | Admitting: Family Medicine

## 2016-02-16 ENCOUNTER — Other Ambulatory Visit: Payer: Self-pay | Admitting: Family Medicine

## 2016-02-16 VITALS — BP 140/80 | HR 88 | Ht 68.0 in | Wt 386.6 lb

## 2016-02-16 DIAGNOSIS — R809 Proteinuria, unspecified: Secondary | ICD-10-CM | POA: Diagnosis not present

## 2016-02-16 DIAGNOSIS — E7800 Pure hypercholesterolemia, unspecified: Secondary | ICD-10-CM | POA: Diagnosis not present

## 2016-02-16 DIAGNOSIS — J449 Chronic obstructive pulmonary disease, unspecified: Secondary | ICD-10-CM

## 2016-02-16 DIAGNOSIS — Z23 Encounter for immunization: Secondary | ICD-10-CM

## 2016-02-16 DIAGNOSIS — E039 Hypothyroidism, unspecified: Secondary | ICD-10-CM

## 2016-02-16 DIAGNOSIS — G4733 Obstructive sleep apnea (adult) (pediatric): Secondary | ICD-10-CM

## 2016-02-16 DIAGNOSIS — F321 Major depressive disorder, single episode, moderate: Secondary | ICD-10-CM

## 2016-02-16 DIAGNOSIS — E119 Type 2 diabetes mellitus without complications: Secondary | ICD-10-CM

## 2016-02-16 DIAGNOSIS — I10 Essential (primary) hypertension: Secondary | ICD-10-CM | POA: Diagnosis not present

## 2016-02-16 DIAGNOSIS — J4489 Other specified chronic obstructive pulmonary disease: Secondary | ICD-10-CM

## 2016-02-16 DIAGNOSIS — E1129 Type 2 diabetes mellitus with other diabetic kidney complication: Secondary | ICD-10-CM | POA: Diagnosis not present

## 2016-02-16 DIAGNOSIS — R079 Chest pain, unspecified: Secondary | ICD-10-CM

## 2016-02-16 MED ORDER — FLUOXETINE HCL 20 MG PO TABS: ORAL_TABLET | ORAL | 1 refills | Status: DC

## 2016-02-16 NOTE — Telephone Encounter (Signed)
This is a new medication.  She is to return in 4-6 weeks to be reassessed to see if it is working (vs dose being changed), assuming that she tolerates it.  I would prefer it to remain at 30d rx, and if doing well, it will be change in the future

## 2016-02-16 NOTE — Progress Notes (Signed)
Patient presents for med check. She arrived 30 minutes late for her appointment. She has cancelled (same day) many visits.  Her original appointment was for AWV, which was rescheduled to February.  Diabetes--Sugars have been in the low 100's in the mornings.  She has never been on medication for diabetes, just diet controlled. She checks her feet regularly, and gets regular pedicures. She reports that her feet are chronically numb, "dead feeling", can always feel the monofilament. She had diabetic eye within the year.. She has early cataracts and early macular degeneration (no notes yet received).  She has gained 28# since her last visit. She reports she never leaves the house.  Only exercise is to get up and get more food.  She signals to the oxygen as the reason for being sedentary.   COPD: She is under the care of pulmonary, Dr. Craige CottaSood.  She reports that she completed pulm rehab a year ago. She reports that her COPD has gotten worse. She is on oxygen all the time now.  Oxygen was increased from 2L to 4L at her last visit.  She uses albuterol twice daily.  Breathing is good only at rest, short of breath and tight with just 2 steps.  No longer recovering quickly.  She describes a tightness in her chest with minimal exertion, and this feels somewhat similar to fluid around heart. She states she has seen the cardiologist and the pulmonologist, but everything was fine. No echo was done.  Reportedly had EKG.  OSA--complaint with her CPAP  Hypertension follow-up: Blood pressures are reportedly okay at other doctors, checks occasionally at home, doesn't recall the numbers. Denies dizziness, headaches. Occasional muscle cramps/charlie horses (not nearly as bad as in the past, mild and infrequent). No edema--She has only been taking lasix once in the morning. Follows a low sodium diet. She reports eating a lot of Lean Cuisine.  Hyperlipidemia follow-up: Patient tries to follow low cholesterol diet.  Overall, eating less processed foods (just occasional hot dog).  Eating pork and chicken, no red meats.  Compliant with her medications and denies any side effects.  GERD follow-up: Compliant with medications (OTC Prilosec). Denies dysphagia. She takes prilosec just once daily, no longer needing it BID like she was at last visit.   Occasional heartburn, not as often in the past.  She complains of many symptoms--and reports she isn't sure if it is her heart, or her reflux.  She states it is so severe that she can't even leave the house to go see a doctor.  She hasn't called 911. She thinks she could be having a heart attack, but then figures maybe she didn't when it ultimately goes away. She describes discomfort in her left arm, tightness in her chest like a rubberband, pain in her lower chest, pain in the right jaw and ear.  These do not happen at the same time, but within a few days of each other.  This lasted for a couple of days, but symptoms have all resolved. Feels fine currently.  Hypothyroidism: Compliant with medications, taking an hour before all other medications. No skin/hair changes. No bowel changes, energy. +weight gain.  Depression--she was very depressed 2 visits ago.  She never ended up calling the therapist.  When her husband had CABG, she surprised herself with how much she was truly capable of doing, when she had to do it.  She had been feeling much better, stronger, optimistic at her last visit. She now seems very hopeless/helpless.  She  never called for counseling.  She has never taken antidepressants.  Vitamin D deficiency--compliant in taking OTC vitamins. Has been normal when checked multiple times on current regimen.  HM: Admits to not scheduling mammogram, or DEXA.  Didn't have colonoscopy.  She had adenomatous polyp in 07/2005. She had cologard in 06/2015 which was negative  PMH, PSH, SH reviewed  Outpatient Encounter Prescriptions as of 02/16/2016  Medication  Sig Note  . aspirin EC 81 MG EC tablet Take 1 tablet (81 mg total) by mouth daily.   Marland Kitchen atorvastatin (LIPITOR) 40 MG tablet TAKE 1 TABLET(40 MG) BY MOUTH DAILY   . benazepril (LOTENSIN) 20 MG tablet TAKE 1 TABLET BY MOUTH DAILY   . budesonide-formoterol (SYMBICORT) 160-4.5 MCG/ACT inhaler Inhale 2 puffs into the lungs 2 (two) times daily.   Marland Kitchen CARTIA XT 240 MG 24 hr capsule TAKE 1 CAPSULE BY MOUTH EVERY DAY   . Cholecalciferol (VITAMIN D) 1000 UNITS capsule Take 1,000 Units by mouth daily.    . furosemide (LASIX) 40 MG tablet TAKE 1 TABLET BY MOUTH TWICE DAILY   . levothyroxine (SYNTHROID, LEVOTHROID) 50 MCG tablet Take 1 tablet (50 mcg total) by mouth daily.   . metoprolol tartrate (LOPRESSOR) 25 MG tablet Take 1 tablet (25 mg total) by mouth 2 (two) times daily.   Marland Kitchen omeprazole (PRILOSEC) 20 MG capsule Take 1 capsule (20 mg total) by mouth 2 (two) times daily.   . potassium chloride (K-DUR,KLOR-CON) 10 MEQ tablet Take 1 tablet (10 mEq total) by mouth daily.   . Albuterol Sulfate (PROAIR RESPICLICK) 108 (90 BASE) MCG/ACT AEPB Inhale 2 puffs into the lungs every 6 (six) hours as needed. (Patient not taking: Reported on 02/16/2016) 07/04/2015: Uses prn only, not needing.  Marland Kitchen FLUoxetine (PROZAC) 20 MG tablet Start at 1/2 tablet by mouth every morning.  Increase to full tablet after a week if tolerating.    No facility-administered encounter medications on file as of 02/16/2016.    Allergies  Allergen Reactions  . Augmentin [Amoxicillin-Pot Clavulanate]     Abdominal pain   ROS:  +DOE, chest pain, jaw pain, ear pain, tightness in legs/ankles, chronic numbness in feet.  See HPI.  Denies fever, chills, URI symptoms, sore throat.  Denies bleeding, bruising, rash.  +depressed, denies suicidal ideation.  See HPI  PHYSICAL EXAM: BP 140/80 (BP Location: Right Arm, Patient Position: Sitting, Cuff Size: Large)   Pulse 88   Ht 5\' 8"  (1.727 m)   Wt (!) 386 lb 9.6 oz (175.4 kg)   BMI 58.78 kg/m   Well  developed, morbidly obese female, somewhat irritable, in no distress.  She was seated on her walker, wearing oxygen, and appeared comfortable and speaking in full sentences.  She currently denies any chest pain or the symptoms described above.  She reports being irritable due to her husband getting her here late for the appointment HEENT: PERRL, EOMI, conjunctiva and sclera are clear Neck: no lymphadenopathy, thyromegaly or mass Heart: regular rate and rhythm Lungs: clear, diminished air movement. No wheezes, rales, ronchi Abdomen: obese, soft, nontender Extremities:  Dry skin Hyperpigmentation at bilateral lower legs Normal sensation to monofilament. Trace edema  Lab Results  Component Value Date   WBC 8.1 01/03/2016   HGB 10.7 (L) 01/03/2016   HCT 32.3 (L) 01/03/2016   MCV 87.8 01/03/2016   PLT 185 01/03/2016   Lab Results  Component Value Date   VITAMINB12 378 01/03/2016   Lab Results  Component Value Date   FOLATE 7.2  01/03/2016     Chemistry      Component Value Date/Time   NA 141 01/03/2016 1109   K 5.0 01/03/2016 1109   CL 100 01/03/2016 1109   CO2 29 01/03/2016 1109   BUN 33 (H) 01/03/2016 1109   CREATININE 1.32 (H) 01/03/2016 1109      Component Value Date/Time   CALCIUM 9.1 01/03/2016 1109   ALKPHOS 98 01/03/2016 1109   AST 11 01/03/2016 1109   ALT 10 01/03/2016 1109   BILITOT 0.6 01/03/2016 1109     Fasting glucose 105  Lab Results  Component Value Date   CHOL 172 01/03/2016   HDL 64 01/03/2016   LDLCALC 88 01/03/2016   TRIG 101 01/03/2016   CHOLHDL 2.7 01/03/2016   Lab Results  Component Value Date   HGBA1C 6.0 (H) 01/03/2016   Hepatitis C Ab negative  Microalbumin/Cr ratio 133 (high; improved from a ratio of 262.5 last year)     ASSESSMENT/PLAN:  Major depressive disorder, single episode, moderate (HCC) - Start fluoxetine--risks/side effects reviewed. Encouraged counseling--chronic illness, affecting her quality of life - Plan:  FLUoxetine (PROZAC) 20 MG tablet  Need for prophylactic vaccination and inoculation against influenza - Plan: Flu vaccine HIGH DOSE PF (Fluzone High dose)  Hypothyroidism, unspecified hypothyroidism type - normal TSH in January. Consider repeat at f/u next month if moods/weight not improving.  Pure hypercholesterolemia - at goal  Essential hypertension, benign - borderline--low sodium diet, regular exercise, weight loss.  Continue current meds  Diet-controlled diabetes mellitus (HCC) - controlled (with diet)  Microalbuminuria due to type 2 diabetes mellitus (HCC) - improved from last year.  DM controlled--improve BP with exercise, weight loss. Consider increasing meds. Continue Benazepril  OSA (obstructive sleep apnea) - continue CPAP. Weight loss encouraged  COPD (chronic obstructive pulmonary disease) with chronic bronchitis (HCC) - poorly controlled. Encouraged her to f/u with Dr. Craige Cotta, as her functioning has clearly worsened since her last visit  Chest pain, unspecified chest pain type - new since she last saw Dr. Elease Hashimoto. Exam reassuring--no signif CHF (only tr edema, lungs fairly clear). Rec she f/u with him. To call 911 with recurrent CP   Take multivitamin daily. Be sure to drink adequate amount of water daily (this should be your beverage of choice, avoid others). You have a mild anemia--unknown etiology.  We should keep an eye on this. If this worsens, your shortness of breath will worsen. Try wearing compression stockings when sitting for a long time if you can't keep your legs elevated.  Depression:  Start fluoxetine (generic prozac).  Start at 1/2 tablet once daily in the morning.  If tolerating, increase to full tablet after a week. I encourage you to get counseling to help deal with issues related to your chronic illness and quality of life.  I previously recommended Berniece Andreas over at [place] (on [address], near [address])--I still think she is great, and recommend  you call her.  Return here in 4-6 weeks to follow up on the medication.   Sounds as though your breathing is considerably worse than at your last pulmonary appointment 2 months ago. I recommend you call and follow up with them soon, since you aren't doing as well.  Consider having blood counts repeated (slight anemia noted, which could contribute to worsening shortness of breath).  Consider labs at f/u in 4 weeks (TSH, repeat CBC, etc.).  35 min visit, more than 1/2 spent counseling (and pt was 30 mins late!)

## 2016-02-16 NOTE — Patient Instructions (Addendum)
Take multivitamin daily. Be sure to drink adequate amount of water daily (this should be your beverage of choice, avoid others). You have a mild anemia--unknown etiology.  We should keep an eye on this. If this worsens, your shortness of breath will worsen. Try wearing compression stockings when sitting for a long time if you can't keep your legs elevated.  Periodically check your blood pressure at home. Let us know if they are running >140/90.  When you have recurrent chest tightness and pain, you need to be evaluated--either in the office or if you feel too badly to get anywhere, call 911.  Start fluoxetine (generic prozac).  Start at 1/2 tablet once daily in the morning.  If tolerating, increase to full tablet after a week. I encourage you to get counseling to help deal with issues related to your chronic illness and quality of life.  I previously recommended Berniece Andreas over at [place] (on [address], near [address])--I still think she is great, and recommend you call her.   Sounds as though your breathing is considerably worse than at your last pulmonary appointment 2 months ago. I recommend you call and follow up with them soon, since you aren't doing as well.  Consider having blood counts repeated (slight anemia noted, which could contribute to worsening shortness of breath).     Return here in 4-6 weeks to follow up on the medication.

## 2016-02-16 NOTE — Telephone Encounter (Signed)
Request for 90 days.

## 2016-02-20 ENCOUNTER — Encounter: Payer: Self-pay | Admitting: *Deleted

## 2016-02-24 ENCOUNTER — Other Ambulatory Visit: Payer: Self-pay

## 2016-03-29 ENCOUNTER — Encounter: Payer: Medicare Other | Admitting: Family Medicine

## 2016-04-08 ENCOUNTER — Other Ambulatory Visit: Payer: Self-pay | Admitting: Family Medicine

## 2016-04-08 DIAGNOSIS — F321 Major depressive disorder, single episode, moderate: Secondary | ICD-10-CM

## 2016-04-09 ENCOUNTER — Encounter: Payer: Medicare Other | Admitting: Family Medicine

## 2016-04-18 ENCOUNTER — Encounter: Payer: Medicare Other | Admitting: Family Medicine

## 2016-04-26 ENCOUNTER — Other Ambulatory Visit: Payer: Self-pay | Admitting: Family Medicine

## 2016-04-26 DIAGNOSIS — E7800 Pure hypercholesterolemia, unspecified: Secondary | ICD-10-CM

## 2016-04-28 ENCOUNTER — Other Ambulatory Visit: Payer: Self-pay | Admitting: Cardiovascular Disease

## 2016-04-30 ENCOUNTER — Other Ambulatory Visit: Payer: Self-pay | Admitting: Cardiovascular Disease

## 2016-05-03 DIAGNOSIS — H25011 Cortical age-related cataract, right eye: Secondary | ICD-10-CM | POA: Diagnosis not present

## 2016-05-03 DIAGNOSIS — H524 Presbyopia: Secondary | ICD-10-CM | POA: Diagnosis not present

## 2016-05-03 DIAGNOSIS — H52203 Unspecified astigmatism, bilateral: Secondary | ICD-10-CM | POA: Diagnosis not present

## 2016-05-03 DIAGNOSIS — H2513 Age-related nuclear cataract, bilateral: Secondary | ICD-10-CM | POA: Diagnosis not present

## 2016-05-03 DIAGNOSIS — H353131 Nonexudative age-related macular degeneration, bilateral, early dry stage: Secondary | ICD-10-CM | POA: Diagnosis not present

## 2016-05-03 DIAGNOSIS — H5203 Hypermetropia, bilateral: Secondary | ICD-10-CM | POA: Diagnosis not present

## 2016-05-03 DIAGNOSIS — E119 Type 2 diabetes mellitus without complications: Secondary | ICD-10-CM | POA: Diagnosis not present

## 2016-05-05 ENCOUNTER — Other Ambulatory Visit: Payer: Self-pay | Admitting: Family Medicine

## 2016-05-05 DIAGNOSIS — F321 Major depressive disorder, single episode, moderate: Secondary | ICD-10-CM

## 2016-05-14 ENCOUNTER — Encounter: Payer: Self-pay | Admitting: Family Medicine

## 2016-05-14 ENCOUNTER — Ambulatory Visit (INDEPENDENT_AMBULATORY_CARE_PROVIDER_SITE_OTHER): Payer: Medicare Other | Admitting: Family Medicine

## 2016-05-14 VITALS — BP 140/68 | HR 84 | Temp 99.5°F | Ht 68.0 in | Wt 386.6 lb

## 2016-05-14 DIAGNOSIS — S31819A Unspecified open wound of right buttock, initial encounter: Secondary | ICD-10-CM

## 2016-05-14 DIAGNOSIS — E119 Type 2 diabetes mellitus without complications: Secondary | ICD-10-CM | POA: Diagnosis not present

## 2016-05-14 DIAGNOSIS — F321 Major depressive disorder, single episode, moderate: Secondary | ICD-10-CM | POA: Diagnosis not present

## 2016-05-14 DIAGNOSIS — I10 Essential (primary) hypertension: Secondary | ICD-10-CM | POA: Diagnosis not present

## 2016-05-14 MED ORDER — MUPIROCIN CALCIUM 2 % EX CREA: 1.0000 "application " | TOPICAL_CREAM | Freq: Two times a day (BID) | CUTANEOUS | 0 refills | Status: DC

## 2016-05-14 MED ORDER — FLUOXETINE HCL 20 MG PO CAPS: 20.0000 mg | ORAL_CAPSULE | Freq: Every day | ORAL | 1 refills | Status: AC

## 2016-05-14 NOTE — Patient Instructions (Signed)
  I'm sending a new prescription for the fluoxetine--increasing the dose to 20mg  so you only have to take 1 daily, rather than 2.  I'm sending a 90 day supply rather than 30.  This is not under the same prescription number, but will be at your pharmacy when you need it.  Continue to limit the sodium in your diet-- read the food labels.  Avoid canned foods (unless low sodium), processed meats. Don't skip meals.  If not hungry, have just a small amount. You need to eat at least 1200 calories/day--small snacks frequently if not hungry for a large meal.  Healthy food choices and portion control is the key to weight loss. Try and be more active--goal is 150 minutes each week of aerobic exercise.  You will need to start at very slow and work your way up (even if it is 5 minutes at a time--do that frequently during the day, and try and work the duration up to 10 minutes gradually).  I don't see any ear infection. The sores on your right butt cheek seem to be healing and not infected. Try using the new prescription bactroban instead of neosporin to see if this further helps with healing.  I encourage you to try a new blood pressure monitor--consider a wrist cuff, as this might be more accurate than a cuff that is too small for the upper arm.  Bring this monitor to a doctor visit (or schedule a nurse visit here) so that we can assess the accuracy of this monitor.  Follow up with me in February, as scheduled.

## 2016-05-14 NOTE — Progress Notes (Signed)
Chief Complaint  Patient presents with  . Depression    6 week follow up. Thinks medicine is helping, but has given her sort of a looser stool/softer since starting. Her bp is sky high. She has had sore on her buttom x 2 months that are not healing. Right ear pain on and off times a couple of months.    This was supposed to be 4-6 week follow up on depression. She was seen 02/16/16.  She was started on fluoxetine, took 21m for 1 week, then increased to 21mcapsules (the tablets originally prescribed weren't covered). Moods have improved. She is no longer hopeless, or having dark thoughts, denies SI. Since starting the medication, stools are looser--not diarrhea, just looser than normal. Denies other side effects. She is able to get herself up to the shower, doing her housework, plays with the dogs, which she hadn't been doing at her last visit (when she was feeling sorry for herself and helpless).  Hypertension follow-up: Blood pressures have been elevated recently. She denies any changes to her medications, other than the edition of the fluoxetine.  She checks in 2 locations and records on paper--on the upper arm (she has a large cuff, but it still wants to pop open when being checked on the upper arm).  She also has been checking on the lower arm with her cuff, and gets about 15-30 points lower.  Upper arm BP's 182-197/60's-80's; when checked on the forearm, they are running 154-177/40's-70's. The last two days, her BP (using the upper arm) were 163-168/62-82. She has been getting some headaches over the last month, at her temples; these are short-lived, 30 minutes. Pain eases off when she does her pursed lip breathing.  She had also started to retain some fluid (swelling in her feet), but that has improved.  She has had a few bologna sandwiches, but tries to follow a low sodium diet.  Denies any significant changes to her diet. Rare/infrequent muscle cramps/charlie horses.  She reports she had  discussed with Dr. NaAcie Fredricksonsing just 1/day of lasix and lopressor instead of BID. She reports being on that once daily dose for about a year, but when she regained weight, BP went up and edema recurred, she increased them both back to twice daily. This has been since her last visit in August.  No change in weight since her last visit.  She has some pain that comes and goes in her right ear.  It is a dull ache.  Denies plugging/popping, denies jaw pain.  "ear ache".  It goes away on its own.  This is sporadic, last pain was a few days ago.  Denies any cold or allergy symptoms, no sore throat, postnasal drainage. Her oxygen dries out her nasal passages.  She noticed sores on her bottom about 2 months ago. She thinks they could be bedsores from when she was "down and out". It hurts in certain positions when she sits.  There may have been some drainage, sometimes sees some dried blood on her panties.  Denies seeing any purulent drainage; denies fevers. She has been using neosporin.  Washing with soap on her washcloth, or using a flushable wipe to clean it after every bowel movement.  Diabetes:  Blood sugars have been <120 fasting (range from 104-115 fasting, over the last week), nonfasting 139, 138, 100.  Denies polydipsia, polyuria.  PMH, PSPontotocSH reviewed  Outpatient Encounter Prescriptions as of 05/14/2016  Medication Sig Note  . aspirin EC 81 MG EC  tablet Take 1 tablet (81 mg total) by mouth daily.   Marland Kitchen atorvastatin (LIPITOR) 40 MG tablet TAKE 1 TABLET(40 MG) BY MOUTH DAILY   . benazepril (LOTENSIN) 20 MG tablet TAKE 1 TABLET BY MOUTH DAILY   . budesonide-formoterol (SYMBICORT) 160-4.5 MCG/ACT inhaler Inhale 2 puffs into the lungs 2 (two) times daily.   Marland Kitchen CARTIA XT 240 MG 24 hr capsule TAKE 1 CAPSULE BY MOUTH EVERY DAY   . Cholecalciferol (VITAMIN D) 1000 UNITS capsule Take 1,000 Units by mouth daily.    . furosemide (LASIX) 40 MG tablet TAKE 1 TABLET BY MOUTH TWICE DAILY   . levothyroxine  (SYNTHROID, LEVOTHROID) 50 MCG tablet Take 1 tablet (50 mcg total) by mouth daily.   . metoprolol tartrate (LOPRESSOR) 25 MG tablet Take 1 tablet (25 mg total) by mouth 2 (two) times daily.   Marland Kitchen omeprazole (PRILOSEC) 20 MG capsule Take 1 capsule (20 mg total) by mouth 2 (two) times daily. (Patient taking differently: Take 20 mg by mouth daily. )   . potassium chloride (K-DUR,KLOR-CON) 10 MEQ tablet Take 1 tablet (10 mEq total) by mouth daily.   . [DISCONTINUED] FLUoxetine (PROZAC) 10 MG capsule TAKE 1 CAPSULE BY MOUTH DAILY FOR 1 WEEK. THEN INCREASE TO 2 CAPSULES DAILY IF TOLERATED.   . Albuterol Sulfate (PROAIR RESPICLICK) 502 (90 BASE) MCG/ACT AEPB Inhale 2 puffs into the lungs every 6 (six) hours as needed. (Patient not taking: Reported on 05/14/2016) 07/04/2015: Uses prn only, not needing.  Marland Kitchen FLUoxetine (PROZAC) 20 MG capsule Take 1 capsule (20 mg total) by mouth daily.   . mupirocin cream (BACTROBAN) 2 % Apply 1 application topically 2 (two) times daily.    No facility-administered encounter medications on file as of 05/14/2016.    Allergies  Allergen Reactions  . Augmentin [Amoxicillin-Pot Clavulanate]     Abdominal pain    ROS:  No fever, chills, URI symptoms. R earache as per HPI. Headaches as per HPI. Denies dizziness, chest pain.  Breathing is at baseline, no recent edema. No nausea, vomiting. Stools slightly looser/softer.  Moods improved. See HPI.    PHYSICAL EXAM:  BP (!) 170/88 (BP Location: Left Arm, Patient Position: Sitting, Cuff Size: Normal)   Pulse 84   Temp 99.5 F (37.5 C) (Tympanic)   Ht _0  (1.727 m)   Wt (!) 386 lb 9.6 oz (175.4 kg)   BMI 58.78 kg/m   140/68 on repeat by MD, large cuff, on the right arm  Morbidly obese female, seated on walker, wearing oxygen.  She is in good spirits, speaking easily in full sentences. HEENT: PERRL, EOMI, conjunctiva and sclera are clear. TM's and EAC's normal. nontender over TMJ, temporalis muscles and temporal arteries. OP  is clear. Neck: no lymphadenopathy or mass Heart: regular rate and rhythm Lungs: clear bilaterally Extremities: no edema, 2+ pulses Neuro: alert and oriented, normal strength Psych: normal mood, affect, hygiene and grooming. Normal speech and eye contact Skin: Along the right medial buttock there is a linear area that has dry skin, with some healing excoriation vs sore.  A few small scabs present.  There is no erythema or warmth, no drainage or fluctuance. No soft tissue swelling or erythema. nontender  ASSESSMENT/PLAN:  Major depressive disorder, single episode, moderate (HCC) - improved; continue 51m fluoxetine--change to 273mdose instead of taking two 1020maps - Plan: FLUoxetine (PROZAC) 20 MG capsule  Essential hypertension, benign - borderline today (elevated initially); low sodium diet, weight loss. Continue current meds  Diet-controlled  diabetes mellitus (HCC) - sugars remain okay.  Check A1c at her med check in February  Wound of right buttock, initial encounter - appears to be healing. use bactroban until completely resolved. - Plan: mupirocin cream (BACTROBAN) 2 %    Depression--improved.  Recently refilled 3m capsule--change to 273mcapsule for next refill.. Marland KitchenHTN--?accuracy of monitor.  BP okay here--needs to follow low sodium diet, stay on the lopressor and lasix BID. Try wrist monitor--bring to a doctor visit to assess the accuracy.    Skin lesion on right buttock--no evidence of ulcer.  Small very superficial abrasion that seems to be healing.  Rx bactroban for better coverage since this has been ongoing for a couple of months, but no systemic infection noted.  F/u as scheduled in Feb for med check. Labs will be due TSH, CBC, c-met, A1c, lipids    I'm sending a new prescription for the fluoxetine--increasing the dose to 2068mo you only have to take 1 daily, rather than 2.  I'm sending a 90 day supply rather than 30.  This is not under the same prescription number,  but will be at your pharmacy when you need it.  Continue to limit the sodium in your diet-- read the food labels.  Avoid canned foods (unless low sodium), processed meats. Don't skip meals.  If not hungry, have just a small amount. You need to eat at least 1200 calories/day--small snacks frequently if not hungry for a large meal.  Healthy food choices and portion control is the key to weight loss. Try and be more active--goal is 150 minutes each week of aerobic exercise.  You will need to start at very slow and work your way up (even if it is 5 minutes at a time--do that frequently during the day, and try and work the duration up to 10 minutes gradually).  I don't see any ear infection. The sores on your right butt cheek seem to be healing and not infected. Try using the new prescription bactroban instead of neosporin to see if this further helps with healing.  I encourage you to try a new blood pressure monitor--consider a wrist cuff, as this might be more accurate than a cuff that is too small for the upper arm.  Bring this monitor to a doctor visit (or schedule a nurse visit here) so that we can assess the accuracy of this monitor.  Follow up with me in February, as scheduled. Labs at visit

## 2016-05-29 ENCOUNTER — Ambulatory Visit: Payer: Medicare Other | Admitting: Pulmonary Disease

## 2016-07-12 ENCOUNTER — Other Ambulatory Visit: Payer: Self-pay | Admitting: Family Medicine

## 2016-07-12 ENCOUNTER — Other Ambulatory Visit: Payer: Self-pay | Admitting: Cardiovascular Disease

## 2016-07-12 ENCOUNTER — Other Ambulatory Visit: Payer: Self-pay | Admitting: Pulmonary Disease

## 2016-07-12 DIAGNOSIS — E039 Hypothyroidism, unspecified: Secondary | ICD-10-CM

## 2016-07-14 ENCOUNTER — Emergency Department (HOSPITAL_BASED_OUTPATIENT_CLINIC_OR_DEPARTMENT_OTHER): Payer: Medicare Other

## 2016-07-14 ENCOUNTER — Inpatient Hospital Stay (HOSPITAL_BASED_OUTPATIENT_CLINIC_OR_DEPARTMENT_OTHER)
Admission: EM | Admit: 2016-07-14 | Discharge: 2016-07-17 | DRG: 309 | Disposition: A | Discharge status: Home / Self Care | Payer: Medicare Other | Attending: Internal Medicine | Admitting: Internal Medicine

## 2016-07-14 ENCOUNTER — Encounter (HOSPITAL_BASED_OUTPATIENT_CLINIC_OR_DEPARTMENT_OTHER): Payer: Self-pay | Admitting: Emergency Medicine

## 2016-07-14 DIAGNOSIS — Z87891 Personal history of nicotine dependence: Secondary | ICD-10-CM

## 2016-07-14 DIAGNOSIS — Z7951 Long term (current) use of inhaled steroids: Secondary | ICD-10-CM | POA: Diagnosis not present

## 2016-07-14 DIAGNOSIS — I480 Paroxysmal atrial fibrillation: Secondary | ICD-10-CM | POA: Diagnosis present

## 2016-07-14 DIAGNOSIS — J449 Chronic obstructive pulmonary disease, unspecified: Secondary | ICD-10-CM | POA: Diagnosis not present

## 2016-07-14 DIAGNOSIS — I5032 Chronic diastolic (congestive) heart failure: Secondary | ICD-10-CM | POA: Diagnosis present

## 2016-07-14 DIAGNOSIS — R000 Tachycardia, unspecified: Secondary | ICD-10-CM

## 2016-07-14 DIAGNOSIS — Z6841 Body Mass Index (BMI) 40.0 and over, adult: Secondary | ICD-10-CM | POA: Diagnosis not present

## 2016-07-14 DIAGNOSIS — E119 Type 2 diabetes mellitus without complications: Secondary | ICD-10-CM | POA: Diagnosis present

## 2016-07-14 DIAGNOSIS — R002 Palpitations: Secondary | ICD-10-CM | POA: Diagnosis present

## 2016-07-14 DIAGNOSIS — I110 Hypertensive heart disease with heart failure: Secondary | ICD-10-CM | POA: Diagnosis not present

## 2016-07-14 DIAGNOSIS — Z881 Allergy status to other antibiotic agents status: Secondary | ICD-10-CM

## 2016-07-14 DIAGNOSIS — E7800 Pure hypercholesterolemia, unspecified: Secondary | ICD-10-CM | POA: Diagnosis present

## 2016-07-14 DIAGNOSIS — Z8261 Family history of arthritis: Secondary | ICD-10-CM

## 2016-07-14 DIAGNOSIS — Z9981 Dependence on supplemental oxygen: Secondary | ICD-10-CM | POA: Diagnosis not present

## 2016-07-14 DIAGNOSIS — Z9049 Acquired absence of other specified parts of digestive tract: Secondary | ICD-10-CM

## 2016-07-14 DIAGNOSIS — I878 Other specified disorders of veins: Secondary | ICD-10-CM | POA: Diagnosis present

## 2016-07-14 DIAGNOSIS — I4892 Unspecified atrial flutter: Secondary | ICD-10-CM | POA: Diagnosis not present

## 2016-07-14 DIAGNOSIS — E039 Hypothyroidism, unspecified: Secondary | ICD-10-CM | POA: Diagnosis not present

## 2016-07-14 DIAGNOSIS — I10 Essential (primary) hypertension: Secondary | ICD-10-CM

## 2016-07-14 DIAGNOSIS — R079 Chest pain, unspecified: Secondary | ICD-10-CM | POA: Diagnosis not present

## 2016-07-14 DIAGNOSIS — E662 Morbid (severe) obesity with alveolar hypoventilation: Secondary | ICD-10-CM | POA: Diagnosis not present

## 2016-07-14 DIAGNOSIS — R0989 Other specified symptoms and signs involving the circulatory and respiratory systems: Secondary | ICD-10-CM | POA: Diagnosis not present

## 2016-07-14 DIAGNOSIS — R7989 Other specified abnormal findings of blood chemistry: Secondary | ICD-10-CM

## 2016-07-14 DIAGNOSIS — Z803 Family history of malignant neoplasm of breast: Secondary | ICD-10-CM

## 2016-07-14 DIAGNOSIS — Z833 Family history of diabetes mellitus: Secondary | ICD-10-CM

## 2016-07-14 DIAGNOSIS — Z9119 Patient's noncompliance with other medical treatment and regimen: Secondary | ICD-10-CM

## 2016-07-14 DIAGNOSIS — Z7982 Long term (current) use of aspirin: Secondary | ICD-10-CM | POA: Diagnosis not present

## 2016-07-14 DIAGNOSIS — R778 Other specified abnormalities of plasma proteins: Secondary | ICD-10-CM

## 2016-07-14 DIAGNOSIS — Z818 Family history of other mental and behavioral disorders: Secondary | ICD-10-CM

## 2016-07-14 DIAGNOSIS — K219 Gastro-esophageal reflux disease without esophagitis: Secondary | ICD-10-CM | POA: Diagnosis present

## 2016-07-14 DIAGNOSIS — R748 Abnormal levels of other serum enzymes: Secondary | ICD-10-CM | POA: Diagnosis present

## 2016-07-14 DIAGNOSIS — Z800 Family history of malignant neoplasm of digestive organs: Secondary | ICD-10-CM

## 2016-07-14 DIAGNOSIS — E785 Hyperlipidemia, unspecified: Secondary | ICD-10-CM | POA: Diagnosis present

## 2016-07-14 DIAGNOSIS — Z8249 Family history of ischemic heart disease and other diseases of the circulatory system: Secondary | ICD-10-CM

## 2016-07-14 DIAGNOSIS — F321 Major depressive disorder, single episode, moderate: Secondary | ICD-10-CM | POA: Diagnosis not present

## 2016-07-14 LAB — COMPREHENSIVE METABOLIC PANEL
ALK PHOS: 89 U/L (ref 38–126)
ALT: 13 U/L — AB (ref 14–54)
AST: 19 U/L (ref 15–41)
Albumin: 4 g/dL (ref 3.5–5.0)
Anion gap: 10 (ref 5–15)
BUN: 27 mg/dL — AB (ref 6–20)
CHLORIDE: 100 mmol/L — AB (ref 101–111)
CO2: 30 mmol/L (ref 22–32)
CREATININE: 1.27 mg/dL — AB (ref 0.44–1.00)
Calcium: 9 mg/dL (ref 8.9–10.3)
GFR calc non Af Amer: 42 mL/min — ABNORMAL LOW (ref >60)
GFR, EST AFRICAN AMERICAN: 49 mL/min — AB (ref >60)
Glucose, Bld: 153 mg/dL — ABNORMAL HIGH (ref 65–99)
Potassium: 3.6 mmol/L (ref 3.5–5.1)
Sodium: 140 mmol/L (ref 135–145)
Total Bilirubin: 0.9 mg/dL (ref 0.3–1.2)
Total Protein: 7.7 g/dL (ref 6.5–8.1)

## 2016-07-14 LAB — TROPONIN I
TROPONIN I: 0.03 ng/mL — AB (ref <0.03)
Troponin I: 0.05 ng/mL (ref <0.03)

## 2016-07-14 LAB — CBC WITH DIFFERENTIAL/PLATELET
Basophils Absolute: 0.1 10*3/uL (ref 0.0–0.1)
Basophils Relative: 1 %
EOS PCT: 1 %
Eosinophils Absolute: 0.1 10*3/uL (ref 0.0–0.7)
HCT: 35.5 % — ABNORMAL LOW (ref 36.0–46.0)
Hemoglobin: 10.6 g/dL — ABNORMAL LOW (ref 12.0–15.0)
LYMPHS ABS: 1 10*3/uL (ref 0.7–4.0)
LYMPHS PCT: 11 %
MCH: 28.1 pg (ref 26.0–34.0)
MCHC: 29.9 g/dL — ABNORMAL LOW (ref 30.0–36.0)
MCV: 94.2 fL (ref 78.0–100.0)
Monocytes Absolute: 0.6 10*3/uL (ref 0.1–1.0)
Monocytes Relative: 6 %
NEUTROS ABS: 7.6 10*3/uL (ref 1.7–7.7)
Neutrophils Relative %: 81 %
PLATELETS: 176 10*3/uL (ref 150–400)
RBC: 3.77 MIL/uL — AB (ref 3.87–5.11)
RDW: 14.4 % (ref 11.5–15.5)
WBC: 9.3 10*3/uL (ref 4.0–10.5)

## 2016-07-14 LAB — GLUCOSE, CAPILLARY: Glucose-Capillary: 132 mg/dL — ABNORMAL HIGH (ref 65–99)

## 2016-07-14 LAB — D-DIMER, QUANTITATIVE (NOT AT ARMC): D DIMER QUANT: 2.31 ug{FEU}/mL — AB (ref 0.00–0.50)

## 2016-07-14 LAB — BRAIN NATRIURETIC PEPTIDE: B Natriuretic Peptide: 846.8 pg/mL — ABNORMAL HIGH (ref 0.0–100.0)

## 2016-07-14 MED ORDER — FUROSEMIDE 10 MG/ML IJ SOLN
60.0000 mg | Freq: Once | INTRAMUSCULAR | Status: AC
  Administered 2016-07-14: 60 mg via INTRAVENOUS
  Filled 2016-07-14 (×2): qty 6

## 2016-07-14 MED ORDER — ONDANSETRON HCL 4 MG/2ML IJ SOLN: 4.0000 mg | Freq: Four times a day (QID) | INTRAMUSCULAR | Status: DC | PRN

## 2016-07-14 MED ORDER — FLUTICASONE FUROATE-VILANTEROL 200-25 MCG/INH IN AEPB
1.0000 | INHALATION_SPRAY | Freq: Every day | RESPIRATORY_TRACT | Status: DC
  Administered 2016-07-15 – 2016-07-17 (×2): 1 via RESPIRATORY_TRACT
  Filled 2016-07-14: qty 28

## 2016-07-14 MED ORDER — METOPROLOL TARTRATE 5 MG/5ML IV SOLN
5.0000 mg | Freq: Once | INTRAVENOUS | Status: AC
  Administered 2016-07-14: 5 mg via INTRAVENOUS
  Filled 2016-07-14: qty 5

## 2016-07-14 MED ORDER — RIVAROXABAN 20 MG PO TABS
20.0000 mg | ORAL_TABLET | Freq: Every day | ORAL | Status: DC
  Administered 2016-07-14 – 2016-07-16 (×3): 20 mg via ORAL
  Filled 2016-07-14 (×3): qty 1

## 2016-07-14 MED ORDER — FUROSEMIDE 40 MG PO TABS
40.0000 mg | ORAL_TABLET | Freq: Two times a day (BID) | ORAL | Status: DC
  Administered 2016-07-15 – 2016-07-17 (×4): 40 mg via ORAL
  Filled 2016-07-14 (×5): qty 1

## 2016-07-14 MED ORDER — LEVOTHYROXINE SODIUM 50 MCG PO TABS
50.0000 ug | ORAL_TABLET | Freq: Every day | ORAL | Status: DC
  Administered 2016-07-15 – 2016-07-17 (×3): 50 ug via ORAL
  Filled 2016-07-14 (×3): qty 1

## 2016-07-14 MED ORDER — BENAZEPRIL HCL 20 MG PO TABS
20.0000 mg | ORAL_TABLET | Freq: Every day | ORAL | Status: DC
  Administered 2016-07-15 – 2016-07-17 (×2): 20 mg via ORAL
  Filled 2016-07-14 (×3): qty 1

## 2016-07-14 MED ORDER — PANTOPRAZOLE SODIUM 40 MG PO TBEC
40.0000 mg | DELAYED_RELEASE_TABLET | Freq: Every day | ORAL | Status: DC
  Administered 2016-07-15 – 2016-07-17 (×2): 40 mg via ORAL
  Filled 2016-07-14 (×3): qty 1

## 2016-07-14 MED ORDER — ACETAMINOPHEN 325 MG PO TABS: 650.0000 mg | ORAL_TABLET | ORAL | Status: DC | PRN

## 2016-07-14 MED ORDER — IPRATROPIUM-ALBUTEROL 0.5-2.5 (3) MG/3ML IN SOLN
RESPIRATORY_TRACT | Status: AC
  Administered 2016-07-14: 3 mL
  Filled 2016-07-14: qty 3

## 2016-07-14 MED ORDER — IOPAMIDOL (ISOVUE-370) INJECTION 76%
80.0000 mL | Freq: Once | INTRAVENOUS | Status: AC | PRN
  Administered 2016-07-14: 80 mL via INTRAVENOUS

## 2016-07-14 MED ORDER — DILTIAZEM HCL 25 MG/5ML IV SOLN
10.0000 mg | Freq: Once | INTRAVENOUS | Status: AC
  Administered 2016-07-14: 10 mg via INTRAVENOUS
  Filled 2016-07-14: qty 5

## 2016-07-14 MED ORDER — ATORVASTATIN CALCIUM 40 MG PO TABS
40.0000 mg | ORAL_TABLET | Freq: Every day | ORAL | Status: DC
  Administered 2016-07-15 – 2016-07-16 (×2): 40 mg via ORAL
  Filled 2016-07-14 (×2): qty 1

## 2016-07-14 MED ORDER — DILTIAZEM HCL 100 MG IV SOLR
5.0000 mg/h | INTRAVENOUS | Status: DC
  Administered 2016-07-14 – 2016-07-15 (×2): 5 mg/h via INTRAVENOUS
  Administered 2016-07-15: 15 mg/h via INTRAVENOUS
  Filled 2016-07-14 (×6): qty 100

## 2016-07-14 MED ORDER — FLUOXETINE HCL 20 MG PO CAPS
20.0000 mg | ORAL_CAPSULE | Freq: Every day | ORAL | Status: DC
  Administered 2016-07-15 – 2016-07-17 (×3): 20 mg via ORAL
  Filled 2016-07-14 (×3): qty 1

## 2016-07-14 NOTE — ED Notes (Signed)
Called to CT d/t IV "blown". Site was d/c'd and restarted in opposite arm.

## 2016-07-14 NOTE — ED Notes (Signed)
Dr. Karma Ganja aware of elevated troponin.

## 2016-07-14 NOTE — ED Notes (Signed)
Patient denies pain and is resting comfortably. BSC at bedside

## 2016-07-14 NOTE — ED Notes (Signed)
Pt back from CT

## 2016-07-14 NOTE — ED Notes (Signed)
Pt stood and pivoted to bedside commode, asked to press call bell when finished

## 2016-07-14 NOTE — ED Triage Notes (Signed)
Pt reports shapr back pain in upper right shoulder blade yesterday that "punched through pt" lasting about 5 minutes.  Pt states since then, she has felt shaky and had cold sweats.  Pt denies any increased SOB from her norm (h/o COPD), O2 sats 85% on 4L home O2 [place] in triage.  Pt placed on 6L O2 in triage, O2 sat raised to 95%.

## 2016-07-14 NOTE — H&P (Addendum)
CARDIOLOGY HISTORY AND PHYSICAL     Date: 07/14/2016 Admitting Physician: Link Snuffer, Md, PhD  Chief Complaint:  Palpitations ____________________________________________________________________ History of Present Illness:  Nancy West is a 71 y.o. old female with medical history noted below presented to the OSH with complaints of palpitations.  She states last night these started and she noted her heart rate was in the 140s-150 and didn't seem to budge.  Earlier this morning it was down in the mid 70s, but again increased back to the 140s this evening and she therefore presented to the ED.  She also complains of some dyspnea, however, she has a history of COPD and notes it is not much worse compared to her baseline.  Has a history of atrial fibrillation which occurred in the context of a large pericardial effusion.  This resolved with the effusion.  To her knowledge she has not had recurrence of atrial fib.    Review of Systems:   Review of Systems:  GEN: no fever, chills, nausea, vomiting, weight change  HEENT: no vision or hearing changes  PULM: no coughing, SOB  CV: no chest pain, +palpitations, PND, orthopnea  GI: no abdominal pain  GU: no dysuria  EXT: no swelling  SKIN: no rashes  NEURO: no numbness or tingling  HEME: no bleeding or bruising  GYN: none  --12 point review systems- otherwise negative.  All other systems reviewed and are negative  Past Medical History:  Diagnosis Date  . Anemia    Noted 12/2011  . Atrial fib/flutter, transient    In setting of pericardial effusion 12/2011  . Chest pain    a. 12/2011 Neg Lexi MV, EF 56%.    . Diabetes mellitus (HCC)    "diet controlled; never been on RX; don't check sugar @ home" (09/01/2013)  . Diastolic CHF (HCC)   . DOE (dyspnea on exertion)    Chronic  . GERD (gastroesophageal reflux disease)   . Hyperlipidemia   . Hypertension   . Hypothyroidism   . Obesity   . Obesity hypoventilation syndrome (HCC)  11/05/2013  . OSA (obstructive sleep apnea) 11/05/2013   on CPAP (not using at visit 2017)  . Pericardial effusion    a. Large, s/p pericardiocentesis 01/02/12 yielding fluid (neg malignant cells, only inflammatory cells)  . Pneumonia 09/01/2013   "for the first time" (09/01/2013)  . Tobacco dependence    a. 46 pack yr hx.  . Type 2 diabetes, diet controlled (HCC)   . Venous stasis of lower extremity    a. chronic LEE    Past Surgical History:  Procedure Laterality Date  . APPENDECTOMY  ~ 2008  . BREAST BIOPSY Right 1980's   a. Benign   . CHOLECYSTECTOMY  1980's  . PERICARDIAL TAP N/A 01/02/2012   Procedure: PERICARDIAL TAP;  Surgeon: Kathleene Hazel, MD;  Location[facility] CATH LAB;  Service: Cardiovascular;  Laterality: N/A;  . PERICARDIOCENTESIS  12/2011   yielding fluid (neg malignant cells, only inflammatory cells)/notes 12/30/2011    Past Cardiovascular History:  - No documented h/o CAD - No documented h/o MI - No documented h/o CHF - No documented h/o PVD - No documented h/o AAA - No documented h/o valvular heart disease - No documented h/o CVA - No documented h/o Arrhythmias +A-fib  - No documented h/o congenital heart disease - No documented h/o CABG - No documented h/o PCI - No documented h/o cardiac devices (Pacer/ICD/CRT) - No documented h/o cardiac surgery  Most recent stress test:  None  Most recent echocardiography:   09/02/13 - Left ventricle: The cavity size was normal. Systolic function was normal. The estimated ejection fraction was in the range of 55% to 65%. Features are consistent with a pseudonormal left ventricular filling pattern, with concomitant abnormal relaxation and increased filling pressure (grade 2 diastolic dysfunction). - Mitral valve: Calcified annulus. - Left atrium: The atrium was moderately dilated. - Right ventricle: The cavity size was moderately to severely dilated. Systolic function was mildly  reduced. - Right atrium: The atrium was severely dilated.  Most recent left heart catheterization:  None  CABG:  Date/ Physician: None  Device history:  None  Prior to Admission medications   Medication Sig Start Date End Date Taking? Authorizing Provider  aspirin EC 81 MG EC tablet Take 1 tablet (81 mg total) by mouth daily. 09/05/13  Yes Vassie Loll, MD  atorvastatin (LIPITOR) 40 MG tablet TAKE 1 TABLET(40 MG) BY MOUTH DAILY 04/26/16  Yes Joselyn Arrow, MD  benazepril (LOTENSIN) 20 MG tablet Take 1 tablet (20 mg total) by mouth daily. 07/13/16  Yes Vesta Mixer, MD  Cholecalciferol (VITAMIN D) 1000 UNITS capsule Take 1,000 Units by mouth daily.    Yes Historical Provider, MD  diltiazem (CARTIA XT) 240 MG 24 hr capsule Take 1 capsule (240 mg total) by mouth daily. 07/13/16  Yes Vesta Mixer, MD  FLUoxetine (PROZAC) 20 MG capsule Take 1 capsule (20 mg total) by mouth daily. 05/14/16  Yes Joselyn Arrow, MD  furosemide (LASIX) 40 MG tablet TAKE 1 TABLET BY MOUTH TWICE DAILY 05/31/15  Yes Vesta Mixer, MD  levothyroxine (SYNTHROID, LEVOTHROID) 50 MCG tablet TAKE 1 TABLET(50 MCG) BY MOUTH DAILY 07/12/16  Yes Joselyn Arrow, MD  metoprolol tartrate (LOPRESSOR) 25 MG tablet Take 1 tablet (25 mg total) by mouth 2 (two) times daily. 07/13/16  Yes Vesta Mixer, MD  omeprazole (PRILOSEC) 20 MG capsule Take 1 capsule (20 mg total) by mouth 2 (two) times daily. Patient taking differently: Take 20 mg by mouth daily.  01/21/12  Yes Rosalio Macadamia, NP  potassium chloride (K-DUR,KLOR-CON) 10 MEQ tablet Take 1 tablet (10 mEq total) by mouth daily. 07/13/16  Yes Vesta Mixer, MD  SYMBICORT 160-4.5 MCG/ACT inhaler INHALE 2 PUFFS INTO THE LUNGS TWICE DAILY 07/13/16  Yes Coralyn Helling, MD  Albuterol Sulfate (PROAIR RESPICLICK) 108 (90 BASE) MCG/ACT AEPB Inhale 2 puffs into the lungs every 6 (six) hours as needed. Patient not taking: Reported on 05/14/2016 03/12/14   Coralyn Helling, MD  furosemide (LASIX) 40 MG tablet Take  1 tablet (40 mg total) by mouth 2 (two) times daily. 07/13/16   Vesta Mixer, MD  mupirocin cream (BACTROBAN) 2 % Apply 1 application topically 2 (two) times daily. 05/14/16   Joselyn Arrow, MD    Allergies  Allergen Reactions  . Augmentin [Amoxicillin-Pot Clavulanate]     Abdominal pain    Social History:   Social History  Substance Use Topics  . Smoking status: Former Smoker    Years: 50.00    Types: Cigarettes    Quit date: 12/16/2013  . Smokeless tobacco: Never Used  . Alcohol use No    Family History  Problem Relation Age of Onset  . Breast cancer Mother 68  . Hypertension Mother   . Arthritis Mother   . Anxiety disorder Mother   . Colon cancer Mother   . Diabetes Father     died in MVA  .  Healthy Sister     Physical Examination: Blood pressure 114/89, pulse (!) 131, temperature 98.3 F (36.8 C), temperature source Oral, resp. rate (!) 22, height 5\' 8"  (1.727 m), weight (!) 176.3 kg (388 lb 9.6 oz), SpO2 95 %. General:  AAOX 4.  NAD.  NRD. Morbidly obese HENT: Normocephalic. Atraumatic.  No acute abnom. EYES: PERRL EOMI  Neck: Supple.  No JVD.  No bruits. Cardiovascular:  S1,S2 intact, tachycardic, No m/r/c. Pulmonary/Chest: CTA B. No rales. No wheezing.  Abdomen: Soft, NT, no masses, no organomegaly. Neuro: CN intact, no motor/sensory deficit.  Ext: Warm. + lower extremity edema SKIN- intact   Intake/Output Summary (Last 24 hours) at 07/14/16 2142 Last data filed at 07/14/16 1751  Gross per 24 hour  Intake                0 ml  Output             1725 ml  Net            -1725 ml    Troponin (Point of Care Test) No results for input(s): TROPIPOC in the last 72 hours. ____________________________________________________________________ Assessment/Plan  Atrial flutter   Assessment:  Patient likely in atrial flutter vs atrial tachycardia.  ECG with RBBB which is new compared to her previous.  Possibly rate dependent aberrancy.  We'll start her on a cardizem  drip to see if we can obtain adequate rate control.  She was on a large dose at home, so if we can't get rate control fairly quickly, we may need to place her on amiodarone.  If confirmed to be typical flutter she'll need a flutter ablation.  CHADS2-Vasc score of 4   Plan  -  Cardizem drip  -  Xarelto for anticoagulation  -  Continuous monitor  -  ECHO  -  Trend trops  -  EP consult  HTN   Assessment:  BP looks good.   Plan  -  Continue home medical management  Hypothyroid   Assessment:  History of hypothyroid.   Plan  -  Continue home medical management  -  Repeat TSH Thank you for consulting cardiology.    Electronically signed by Nada Maclachlan 07/14/2016 Link Snuffer, MD, PhD Cardiology

## 2016-07-14 NOTE — ED Provider Notes (Signed)
[facility]-EMERGENCY DEPT Provider Note   CSN: [id number] Arrival date & time: 07/14/16  1314     History   Chief Complaint Chief Complaint  Patient presents with  . Chest Pain    HPI Nancy West is a 71 y.o. female.  HPI  Pt with hx of CHF, O2 dependent COPD presenting with c/o chest pain- pt states she had 2 episodes of chest pain last night- lasting approx 5 minutes- states she had pain in shoulder blade that radiated into her jaw.  Pain resolved on its own.  She also states that for the past 2-3 days her HR has been elevated.  She states after the chest pain, her HR was in 70s- but otherwise HR has been 130s or 150s.  She also feels the palpitations/sensation of heart racing.  She denies fever, no vomiting or diarrhea.  She also feels that both her legs are more swollen than usual.  She denies any worsening of her baselien shortness of breath.  Normally uses 4L O2 and has not had to increase this.  There are no other associated systemic symptoms, there are no other alleviating or modifying factors.   Past Medical History:  Diagnosis Date  . Anemia    Noted 12/2011  . Atrial fib/flutter, transient    In setting of pericardial effusion 12/2011  . Chest pain    a. 12/2011 Neg Lexi MV, EF 56%.    . Diabetes mellitus (HCC)    "diet controlled; never been on RX; don't check sugar @ home" (09/01/2013)  . Diastolic CHF (HCC)   . DOE (dyspnea on exertion)    Chronic  . GERD (gastroesophageal reflux disease)   . Hyperlipidemia   . Hypertension   . Hypothyroidism   . Obesity   . Obesity hypoventilation syndrome (HCC) 11/05/2013  . OSA (obstructive sleep apnea) 11/05/2013   on CPAP (not using at visit 2017)  . Pericardial effusion    a. Large, s/p pericardiocentesis 01/02/12 yielding fluid (neg malignant cells, only inflammatory cells)  . Pneumonia 09/01/2013   "for the first time" (09/01/2013)  . Tobacco dependence    a. 46 pack yr hx.  . Type 2 diabetes, diet controlled (HCC)     . Venous stasis of lower extremity    a. chronic LEE    Patient Active Problem List   Diagnosis Date Noted  . Atrial flutter (HCC) 07/14/2016  . Controlled diabetes mellitus with microalbuminuria (HCC) 01/25/2016  . Major depressive disorder, single episode, moderate (HCC) 11/29/2014  . Advance care planning 11/29/2014  . OSA (obstructive sleep apnea) 11/05/2013  . Obesity hypoventilation syndrome (HCC) 11/05/2013  . COPD (chronic obstructive pulmonary disease) with chronic bronchitis (HCC) 11/05/2013  . CHF (congestive heart failure) (HCC) 09/01/2013  . Dyspnea 09/01/2013  . CHF exacerbation (HCC) 09/01/2013  . Morbid obesity with body mass index of 40.0-49.9 (HCC) 03/09/2013  . Chest pain 01/21/2012  . Acute pericardial effusion 01/02/2012  . Atrial fibrillation (HCC) 01/02/2012  . Obesity (BMI 30-39.9) 12/13/2011  . Chronic diastolic heart failure (HCC) 02/21/2011  . Diet-controlled diabetes mellitus (HCC) 11/17/2010  . Essential hypertension, benign 11/17/2010  . Pure hypercholesterolemia 11/17/2010  . Hypothyroidism 11/17/2010  . Unspecified vitamin D deficiency 11/17/2010    Past Surgical History:  Procedure Laterality Date  . APPENDECTOMY  ~ 2008  . BREAST BIOPSY Right 1980's   a. Benign   . CHOLECYSTECTOMY  1980's  . PERICARDIAL TAP N/A 01/02/2012   Procedure: PERICARDIAL TAP;  Surgeon: Cristal Deer  Ellene Route, MD;  Location: [facility] CATH LAB;  Service: Cardiovascular;  Laterality: N/A;  . PERICARDIOCENTESIS  12/2011   yielding fluid (neg malignant cells, only inflammatory cells)/notes 12/30/2011    OB History    Gravida Para Term Preterm AB Living   0 0 0 0 0 0   SAB TAB Ectopic Multiple Live Births   0 0 0 0         Home Medications    Prior to Admission medications   Medication Sig Start Date End Date Taking? Authorizing Provider  aspirin EC 81 MG EC tablet Take 1 tablet (81 mg total) by mouth daily. 09/05/13  Yes Vassie Loll, MD  atorvastatin  (LIPITOR) 40 MG tablet TAKE 1 TABLET(40 MG) BY MOUTH DAILY 04/26/16  Yes Joselyn Arrow, MD  benazepril (LOTENSIN) 20 MG tablet Take 1 tablet (20 mg total) by mouth daily. 07/13/16  Yes Vesta Mixer, MD  Cholecalciferol (VITAMIN D) 1000 UNITS capsule Take 1,000 Units by mouth daily.    Yes Historical Provider, MD  diltiazem (CARTIA XT) 240 MG 24 hr capsule Take 1 capsule (240 mg total) by mouth daily. 07/13/16  Yes Vesta Mixer, MD  FLUoxetine (PROZAC) 20 MG capsule Take 1 capsule (20 mg total) by mouth daily. 05/14/16  Yes Joselyn Arrow, MD  furosemide (LASIX) 40 MG tablet TAKE 1 TABLET BY MOUTH TWICE DAILY 05/31/15  Yes Vesta Mixer, MD  levothyroxine (SYNTHROID, LEVOTHROID) 50 MCG tablet TAKE 1 TABLET(50 MCG) BY MOUTH DAILY 07/12/16  Yes Joselyn Arrow, MD  metoprolol tartrate (LOPRESSOR) 25 MG tablet Take 1 tablet (25 mg total) by mouth 2 (two) times daily. 07/13/16  Yes Vesta Mixer, MD  Multiple Vitamins-Minerals (ONE-A-DAY WOMENS 50+ ADVANTAGE) TABS Take 1 tablet by mouth daily after breakfast.   Yes Historical Provider, MD  omeprazole (PRILOSEC) 20 MG capsule Take 1 capsule (20 mg total) by mouth 2 (two) times daily. Patient taking differently: Take 20 mg by mouth daily.  01/21/12  Yes Rosalio Macadamia, NP  potassium chloride (K-DUR,KLOR-CON) 10 MEQ tablet Take 1 tablet (10 mEq total) by mouth daily. 07/13/16  Yes Vesta Mixer, MD  SYMBICORT 160-4.5 MCG/ACT inhaler INHALE 2 PUFFS INTO THE LUNGS TWICE DAILY 07/13/16  Yes Coralyn Helling, MD  Albuterol Sulfate (PROAIR RESPICLICK) 108 (90 BASE) MCG/ACT AEPB Inhale 2 puffs into the lungs every 6 (six) hours as needed. Patient not taking: Reported on 05/14/2016 03/12/14   Coralyn Helling, MD  mupirocin cream (BACTROBAN) 2 % Apply 1 application topically 2 (two) times daily. 05/14/16   Joselyn Arrow, MD    Family History Family History  Problem Relation Age of Onset  . Breast cancer Mother 48  . Hypertension Mother   . Arthritis Mother   . Anxiety disorder  Mother   . Colon cancer Mother   . Diabetes Father     died in MVA  . Healthy Sister     Social History Social History  Substance Use Topics  . Smoking status: Former Smoker    Years: 50.00    Types: Cigarettes    Quit date: 12/16/2013  . Smokeless tobacco: Never Used  . Alcohol use No     Allergies   Augmentin [amoxicillin-pot clavulanate]   Review of Systems Review of Systems  ROS reviewed and all otherwise negative except for mentioned in HPI   Physical Exam Updated Vital Signs BP 118/70 (BP Location: Left Arm)   Pulse (!) 130   Temp 97.6 F (36.4  C) (Oral)   Resp 19   Ht 5\' 8"  (1.727 m)   Wt (!) 176.4 kg Comment: Scale B  SpO2 97%   BMI 59.15 kg/m  Vitals reviewed Physical Exam Physical Examination: General appearance - alert, obese, chronically ill appearing but in no acute distress Mental status - alert, oriented to person, place, and time Eyes - no conjunctival injection, no scleral icterus Chest - BS diminished but clear to auscultation, no wheezes, rales or rhonchi, symmetric air entry Heart - tachycardic regular rate, regular rhythm, normal S1, S2, no murmurs, rubs, clicks or gallops Abdomen - soft, nontender, nondistended, no masses or organomegaly Neurological - alert, oriented x 3, normal speech, sensation intact Extremities - vilateral 2-3+ pitting edema of lower extremities up shins, venous stasis dermatitis changes as well Skin - normal coloration and turgor, no rashes- other than venous stasis changes as noted  ED Treatments / Results  Labs (all labs ordered are listed, but only abnormal results are displayed) Labs Reviewed  CBC WITH DIFFERENTIAL/PLATELET - Abnormal; Notable for the following:       Result Value   RBC 3.77 (*)    Hemoglobin 10.6 (*)    HCT 35.5 (*)    MCHC 29.9 (*)    All other components within normal limits  COMPREHENSIVE METABOLIC PANEL - Abnormal; Notable for the following:    Chloride 100 (*)    Glucose, Bld 153  (*)    BUN 27 (*)    Creatinine, Ser 1.27 (*)    ALT 13 (*)    GFR calc non Af Amer 42 (*)    GFR calc Af Amer 49 (*)    All other components within normal limits  D-DIMER, QUANTITATIVE (NOT AT [facility]) - Abnormal; Notable for the following:    D-Dimer, Quant 2.31 (*)    All other components within normal limits  TROPONIN I - Abnormal; Notable for the following:    Troponin I 0.03 (*)    All other components within normal limits  BRAIN NATRIURETIC PEPTIDE - Abnormal; Notable for the following:    B Natriuretic Peptide 846.8 (*)    All other components within normal limits  BASIC METABOLIC PANEL - Abnormal; Notable for the following:    Glucose, Bld 107 (*)    BUN 28 (*)    Creatinine, Ser 1.69 (*)    GFR calc non Af Amer 30 (*)    GFR calc Af Amer 35 (*)    All other components within normal limits  CBC - Abnormal; Notable for the following:    WBC 10.8 (*)    RBC 3.63 (*)    Hemoglobin 10.1 (*)    HCT 33.8 (*)    MCHC 29.9 (*)    All other components within normal limits  GLUCOSE, CAPILLARY - Abnormal; Notable for the following:    Glucose-Capillary 132 (*)    All other components within normal limits  TROPONIN I - Abnormal; Notable for the following:    Troponin I 0.05 (*)    All other components within normal limits  TROPONIN I - Abnormal; Notable for the following:    Troponin I 0.05 (*)    All other components within normal limits  GLUCOSE, CAPILLARY - Abnormal; Notable for the following:    Glucose-Capillary 113 (*)    All other components within normal limits  TSH  TROPONIN I    EKG  EKG Interpretation  Date/Time:  Saturday July 14 2016 16:54:50 EST Ventricular Rate:  139  PR Interval:  142 QRS Duration: 160 QT Interval:  398 QTC Calculation: 606 R Axis:   78 Text Interpretation:  Sinus tachycardia Right bundle branch block No significant change since last tracing Confirmed by [facility]  MD, MARTHA [phone]) on 07/14/2016 4:57:54 PM       Radiology Dg  Chest 2 View  Result Date: 07/14/2016 CLINICAL DATA:  Pt reports sharp back pain in upper right shoulder blade yesterday that "punched through pt" lasting about 5 minutes. Pt states since then, she has felt shaky and had cold sweats EXAM: CHEST  2 VIEW COMPARISON:  Radiograph 10/31/2015 FINDINGS: Stable enlarged cardiac silhouette. Central venous pulmonary congestion increased from prior. New focus of consolidation. No pleural fluid. IMPRESSION: Cardiomegaly and central venous congestion suggest mild congestive heart failure Electronically Signed   By: Genevive Bi M.D.   On: 07/14/2016 14:17   Ct Angio Chest Pe W Or Wo Contrast  Result Date: 07/14/2016 CLINICAL DATA:  Elevated D-dimer, chest pain. EXAM: CT ANGIOGRAPHY CHEST WITH CONTRAST TECHNIQUE: Multidetector CT imaging of the chest was performed using the standard protocol during bolus administration of intravenous contrast. Multiplanar CT image reconstructions and MIPs were obtained to evaluate the vascular anatomy. CONTRAST:  80 cc Isovue COMPARISON:  Chest radiograph 07/14/2016 FINDINGS: Cardiovascular: Exam is somewhat limited by patient body habitus. No filling defects within the proximal pulmonary arteries. The lower lobe pulmonary arteries are not well evaluated. Mediastinum/Nodes: No axillary supraclavicular adenopathy. No mediastinal adenopathy. Esophagus normal. Lungs/Pleura: No pulmonary infarction. No consolidation. Mild interstitial thickening. Minimal atelectasis. No effusion. Upper Abdomen: Limited view of the liver, kidneys, pancreas are unremarkable. Normal adrenal glands. Musculoskeletal: No aggressive osseous lesion. Review of the MIP images confirms the above findings. IMPRESSION: 1. No evidence acute pulmonary embolism in suboptimal exam due to patient body habitus. 2. Mild interstitial edema. 3. No overt pulmonary edema, infarction or pneumonia. Electronically Signed   By: Genevive Bi M.D.   On: 07/14/2016 16:38     Procedures Procedures (including critical care time)  Medications Ordered in ED Medications  fluticasone furoate-vilanterol (BREO ELLIPTA) 200-25 MCG/INH 1 puff (1 puff Inhalation Not Given 07/14/16 2239)  levothyroxine (SYNTHROID, LEVOTHROID) tablet 50 mcg (50 mcg Oral Given 07/15/16 0547)  benazepril (LOTENSIN) tablet 20 mg (not administered)  furosemide (LASIX) tablet 40 mg (not administered)  FLUoxetine (PROZAC) capsule 20 mg (not administered)  atorvastatin (LIPITOR) tablet 40 mg (not administered)  pantoprazole (PROTONIX) EC tablet 40 mg (not administered)  acetaminophen (TYLENOL) tablet 650 mg (not administered)  ondansetron (ZOFRAN) injection 4 mg (not administered)  rivaroxaban (XARELTO) tablet 20 mg (20 mg Oral Given 07/14/16 2303)  diltiazem (CARDIZEM) 100 mg in dextrose 5 % 100 mL (1 mg/mL) infusion (10 mg/hr Intravenous Rate/Dose Change 07/15/16 0133)  ipratropium-albuterol (DUONEB) 0.5-2.5 (3) MG/3ML nebulizer solution (3 mLs  Given 07/14/16 1349)  iopamidol (ISOVUE-370) 76 % injection 80 mL (80 mLs Intravenous Contrast Given 07/14/16 1519)  furosemide (LASIX) injection 60 mg (60 mg Intravenous Given 07/14/16 1559)  diltiazem (CARDIZEM) injection 10 mg (10 mg Intravenous Given 07/14/16 1704)  metoprolol (LOPRESSOR) injection 5 mg (5 mg Intravenous Given 07/14/16 1745)   CRITICAL CARE Performed by: Ethelda Chick Total critical care time: 35 minutes Critical care time was exclusive of separately billable procedures and treating other patients. Critical care was necessary to treat or prevent imminent or life-threatening deterioration. Critical care was time spent personally by me on the following activities: development of treatment plan with patient and/or surrogate as well as nursing, discussions  with consultants, evaluation of patient's response to treatment, examination of patient, obtaining history from patient or surrogate, ordering and performing treatments and  interventions, ordering and review of laboratory studies, ordering and review of radiographic studies, pulse oximetry and re-evaluation of patient's condition.  Initial Impression / Assessment and Plan / ED Course  I have reviewed the triage vital signs and the nursing notes.  Pertinent labs & imaging results that were available during my care of the patient were reviewed by me and considered in my medical decision making (see chart for details).    5:15 PM d/w Dr. Tiburcio Pea, cardiology- he accepts patient in transfer.  As there are no stepdown beds, he is agreeable to a telemetry bed for this patient.    Of note, pt has no ongoing chest pain, last chest pain was last night. Remains tachycardic, repeat ekg also shows sinus tachycardia. Gave bolus of diltiazem without much change in rate.  Will give lopressor IV.  Possibly sinus tach, aflutter, atrial tachycardia- will have further management by cardiology during admision    Final Clinical Impressions(s) / ED Diagnoses   Final diagnoses:  Chest pain, unspecified type  Sinus tachycardia  Elevated troponin    New Prescriptions Current Discharge Medication List       Jerelyn Scott, MD 07/15/16 [phone]

## 2016-07-15 ENCOUNTER — Observation Stay (HOSPITAL_COMMUNITY): Payer: Medicare Other

## 2016-07-15 ENCOUNTER — Encounter (HOSPITAL_COMMUNITY): Payer: Self-pay | Admitting: Internal Medicine

## 2016-07-15 DIAGNOSIS — I110 Hypertensive heart disease with heart failure: Secondary | ICD-10-CM | POA: Diagnosis present

## 2016-07-15 DIAGNOSIS — E039 Hypothyroidism, unspecified: Secondary | ICD-10-CM | POA: Diagnosis present

## 2016-07-15 DIAGNOSIS — Z9981 Dependence on supplemental oxygen: Secondary | ICD-10-CM | POA: Diagnosis not present

## 2016-07-15 DIAGNOSIS — I480 Paroxysmal atrial fibrillation: Secondary | ICD-10-CM

## 2016-07-15 DIAGNOSIS — Z9049 Acquired absence of other specified parts of digestive tract: Secondary | ICD-10-CM | POA: Diagnosis not present

## 2016-07-15 DIAGNOSIS — Z8249 Family history of ischemic heart disease and other diseases of the circulatory system: Secondary | ICD-10-CM | POA: Diagnosis not present

## 2016-07-15 DIAGNOSIS — I209 Angina pectoris, unspecified: Secondary | ICD-10-CM | POA: Diagnosis not present

## 2016-07-15 DIAGNOSIS — R000 Tachycardia, unspecified: Secondary | ICD-10-CM | POA: Diagnosis present

## 2016-07-15 DIAGNOSIS — I878 Other specified disorders of veins: Secondary | ICD-10-CM | POA: Diagnosis present

## 2016-07-15 DIAGNOSIS — Z7951 Long term (current) use of inhaled steroids: Secondary | ICD-10-CM | POA: Diagnosis not present

## 2016-07-15 DIAGNOSIS — Z87891 Personal history of nicotine dependence: Secondary | ICD-10-CM | POA: Diagnosis not present

## 2016-07-15 DIAGNOSIS — Z7982 Long term (current) use of aspirin: Secondary | ICD-10-CM | POA: Diagnosis not present

## 2016-07-15 DIAGNOSIS — I5032 Chronic diastolic (congestive) heart failure: Secondary | ICD-10-CM | POA: Diagnosis not present

## 2016-07-15 DIAGNOSIS — I340 Nonrheumatic mitral (valve) insufficiency: Secondary | ICD-10-CM | POA: Diagnosis not present

## 2016-07-15 DIAGNOSIS — R748 Abnormal levels of other serum enzymes: Secondary | ICD-10-CM | POA: Diagnosis present

## 2016-07-15 DIAGNOSIS — K219 Gastro-esophageal reflux disease without esophagitis: Secondary | ICD-10-CM | POA: Diagnosis present

## 2016-07-15 DIAGNOSIS — Z9119 Patient's noncompliance with other medical treatment and regimen: Secondary | ICD-10-CM | POA: Diagnosis not present

## 2016-07-15 DIAGNOSIS — E662 Morbid (severe) obesity with alveolar hypoventilation: Secondary | ICD-10-CM | POA: Diagnosis present

## 2016-07-15 DIAGNOSIS — R079 Chest pain, unspecified: Secondary | ICD-10-CM | POA: Diagnosis not present

## 2016-07-15 DIAGNOSIS — J449 Chronic obstructive pulmonary disease, unspecified: Secondary | ICD-10-CM | POA: Diagnosis not present

## 2016-07-15 DIAGNOSIS — Z833 Family history of diabetes mellitus: Secondary | ICD-10-CM | POA: Diagnosis not present

## 2016-07-15 DIAGNOSIS — I10 Essential (primary) hypertension: Secondary | ICD-10-CM | POA: Diagnosis not present

## 2016-07-15 DIAGNOSIS — Z803 Family history of malignant neoplasm of breast: Secondary | ICD-10-CM | POA: Diagnosis not present

## 2016-07-15 DIAGNOSIS — G4733 Obstructive sleep apnea (adult) (pediatric): Secondary | ICD-10-CM

## 2016-07-15 DIAGNOSIS — Z6841 Body Mass Index (BMI) 40.0 and over, adult: Secondary | ICD-10-CM | POA: Diagnosis not present

## 2016-07-15 DIAGNOSIS — I349 Nonrheumatic mitral valve disorder, unspecified: Secondary | ICD-10-CM | POA: Diagnosis not present

## 2016-07-15 DIAGNOSIS — I4892 Unspecified atrial flutter: Secondary | ICD-10-CM | POA: Diagnosis not present

## 2016-07-15 DIAGNOSIS — I483 Typical atrial flutter: Secondary | ICD-10-CM | POA: Diagnosis not present

## 2016-07-15 DIAGNOSIS — R002 Palpitations: Secondary | ICD-10-CM | POA: Diagnosis present

## 2016-07-15 DIAGNOSIS — I484 Atypical atrial flutter: Secondary | ICD-10-CM | POA: Diagnosis not present

## 2016-07-15 DIAGNOSIS — F321 Major depressive disorder, single episode, moderate: Secondary | ICD-10-CM | POA: Diagnosis present

## 2016-07-15 DIAGNOSIS — E7800 Pure hypercholesterolemia, unspecified: Secondary | ICD-10-CM | POA: Diagnosis present

## 2016-07-15 DIAGNOSIS — E119 Type 2 diabetes mellitus without complications: Secondary | ICD-10-CM | POA: Diagnosis present

## 2016-07-15 LAB — CBC
HEMATOCRIT: 33.8 % — AB (ref 36.0–46.0)
Hemoglobin: 10.1 g/dL — ABNORMAL LOW (ref 12.0–15.0)
MCH: 27.8 pg (ref 26.0–34.0)
MCHC: 29.9 g/dL — ABNORMAL LOW (ref 30.0–36.0)
MCV: 93.1 fL (ref 78.0–100.0)
PLATELETS: 183 10*3/uL (ref 150–400)
RBC: 3.63 MIL/uL — ABNORMAL LOW (ref 3.87–5.11)
RDW: 14.7 % (ref 11.5–15.5)
WBC: 10.8 10*3/uL — AB (ref 4.0–10.5)

## 2016-07-15 LAB — BASIC METABOLIC PANEL
Anion gap: 12 (ref 5–15)
BUN: 28 mg/dL — AB (ref 6–20)
CO2: 31 mmol/L (ref 22–32)
CREATININE: 1.69 mg/dL — AB (ref 0.44–1.00)
Calcium: 9.1 mg/dL (ref 8.9–10.3)
Chloride: 101 mmol/L (ref 101–111)
GFR calc non Af Amer: 30 mL/min — ABNORMAL LOW (ref >60)
GFR, EST AFRICAN AMERICAN: 35 mL/min — AB (ref >60)
Glucose, Bld: 107 mg/dL — ABNORMAL HIGH (ref 65–99)
POTASSIUM: 3.8 mmol/L (ref 3.5–5.1)
Sodium: 144 mmol/L (ref 135–145)

## 2016-07-15 LAB — TROPONIN I
Troponin I: 0.05 ng/mL (ref <0.03)
Troponin I: 0.05 ng/mL (ref <0.03)

## 2016-07-15 LAB — GLUCOSE, CAPILLARY
GLUCOSE-CAPILLARY: 108 mg/dL — AB (ref 65–99)
GLUCOSE-CAPILLARY: 109 mg/dL — AB (ref 65–99)
Glucose-Capillary: 113 mg/dL — ABNORMAL HIGH (ref 65–99)
Glucose-Capillary: 120 mg/dL — ABNORMAL HIGH (ref 65–99)

## 2016-07-15 LAB — TSH: TSH: 3.919 u[IU]/mL (ref 0.350–4.500)

## 2016-07-15 NOTE — Consult Note (Signed)
ELECTROPHYSIOLOGY CONSULT NOTE    Primary Care Physician: Lavonda Jumbo, MD Referring Physician:  Dr Tiburcio Pea  Admit Date: 07/14/2016  Reason for consultation:  Atrial arrhythmias  Nancy West is a 71 y.o. female with a h/o morbid obesity, chronic diastolic dysfunction, OSA not compliant with CPAP, and atrial fibrillation who now presents with atrial flutter.  She is chronically debilitated due to her obesity and its secondary sequela.  She had considered bariatric surgery previously and event attended classes but did not proceed. She has OSA and does not use her CPAP.  She has chronic SOB.  She reports abrupt onset of tachypalpitations on Thursday.  She reports worsening SOB and chest discomfort with this.  She presented to [facility] yesterday afternoon and was found to be in atrial flutter with RVR.  She was admitted for further evaluation.   She has been initiated on xarelto (first dose last night per patient) and is currently on IV diltiazem.  She remains in atrial flutter.  The patient is tolerating medications without difficulties and is otherwise without complaint today.   Past Medical History:  Diagnosis Date  . Anemia    Noted 12/2011  . Atrial fib/flutter, transient    In setting of pericardial effusion 12/2011  . Chest pain    a. 12/2011 Neg Lexi MV, EF 56%.    . Diabetes mellitus (HCC)    "diet controlled; never been on RX; don't check sugar @ home" (09/01/2013)  . Diastolic CHF (HCC)   . DOE (dyspnea on exertion)    Chronic  . GERD (gastroesophageal reflux disease)   . Hyperlipidemia   . Hypertension   . Hypothyroidism   . Obesity   . Obesity hypoventilation syndrome (HCC) 11/05/2013  . OSA (obstructive sleep apnea) 11/05/2013   noncompliant with CPAP  . Pericardial effusion    a. Large, s/p pericardiocentesis 01/02/12 yielding fluid (neg malignant cells, only inflammatory cells)  . Pneumonia 09/01/2013   "for the first time" (09/01/2013)  . Tobacco dependence      a. 46 pack yr hx.  . Type 2 diabetes, diet controlled (HCC)   . Venous stasis of lower extremity    a. chronic LEE   Past Surgical History:  Procedure Laterality Date  . APPENDECTOMY  ~ 2008  . BREAST BIOPSY Right 1980's   a. Benign   . CHOLECYSTECTOMY  1980's  . PERICARDIAL TAP N/A 01/02/2012   Procedure: PERICARDIAL TAP;  Surgeon: Kathleene Hazel, MD;  Location: [facility] CATH LAB;  Service: Cardiovascular;  Laterality: N/A;  . PERICARDIOCENTESIS  12/2011   yielding fluid (neg malignant cells, only inflammatory cells)/notes 12/30/2011    . atorvastatin  40 mg Oral q1800  . benazepril  20 mg Oral Daily  . FLUoxetine  20 mg Oral Daily  . fluticasone furoate-vilanterol  1 puff Inhalation Daily  . furosemide  40 mg Oral BID  . levothyroxine  50 mcg Oral QAC breakfast  . pantoprazole  40 mg Oral Daily  . rivaroxaban  20 mg Oral Q supper   . diltiazem (CARDIZEM) infusion 15 mg/hr (07/15/16 1126)    Allergies  Allergen Reactions  . Augmentin [Amoxicillin-Pot Clavulanate] Other (See Comments)    Abdominal pain    Social History   Social History  . Marital status: Married    Spouse name: N/A  . Number of children: 0  . Years of education: N/A   Occupational History  . Retired (Presenter, broadcasting) [employer]   Social History Main  Topics  . Smoking status: Former Smoker    Years: 50.00    Types: Cigarettes    Quit date: 12/16/2013  . Smokeless tobacco: Never Used  . Alcohol use No  . Drug use: No  . Sexual activity: No   Other Topics Concern  . Not on file   Social History Narrative   Lives in [place] with husband.  Quit smoking 11/2013 (when she was put on oxygen)   04/2015 husband had CABG, afib    Family History  Problem Relation Age of Onset  . Breast cancer Mother 37  . Hypertension Mother   . Arthritis Mother   . Anxiety disorder Mother   . Colon cancer Mother   . Diabetes Father     died in MVA  . Healthy Sister     ROS- All systems are  reviewed and negative except as per the HPI above  Physical Exam: Telemetry: Vitals:   07/15/16 0000 07/15/16 0555 07/15/16 0849 07/15/16 1122  BP: 121/68 118/70  131/87  Pulse: (!) 129 (!) 130  (!) 140  Resp: 20 19  (!) 22  Temp: 97.9 F (36.6 C) 97.6 F (36.4 C)  97.9 F (36.6 C)  TempSrc: Oral Oral  Oral  SpO2: 97% 97% 96% 98%  Weight:  (!) 389 lb (176.4 kg)    Height:        GEN- The patient is morbidly obese and chronically ill appearing, wearing O2, alert and oriented x 3 today.   Head- normocephalic, atraumatic Eyes-  Sclera clear, conjunctiva pink Ears- hearing intact Oropharynx- clear Neck- supple, + JVD Lungs- decreased BS throughout, normal work of breathing Heart- tachycardic irregular rhythm GI- soft, NT, ND, + BS Extremities- no clubbing, cyanosis, + 2 dependant edema with venous stasis changes MS- no significant deformity or atrophy Skin- no rash or lesion Psych- euthymic mood, full affect Neuro- strength and sensation are intact  EKG reveals atrial flutter, RBBB  Labs:   Lab Results  Component Value Date   WBC 10.8 (H) 07/15/2016   HGB 10.1 (L) 07/15/2016   HCT 33.8 (L) 07/15/2016   MCV 93.1 07/15/2016   PLT 183 07/15/2016    Recent Labs Lab 07/14/16 1345 07/15/16 0341  NA 140 144  K 3.6 3.8  CL 100* 101  CO2 30 31  BUN 27* 28*  CREATININE 1.27* 1.69*  CALCIUM 9.0 9.1  PROT 7.7  --   BILITOT 0.9  --   ALKPHOS 89  --   ALT 13*  --   AST 19  --   GLUCOSE 153* 107*   Lab Results  Component Value Date   CKTOTAL 31 12/31/2011   CKMB 2.3 12/31/2011   TROPONINI 0.05 (HH) 07/15/2016    Lab Results  Component Value Date   CHOL 172 01/03/2016   CHOL 174 06/29/2015   CHOL 161 03/18/2014   Lab Results  Component Value Date   HDL 64 01/03/2016   HDL 65 06/29/2015   HDL 60 03/18/2014   Lab Results  Component Value Date   LDLCALC 88 01/03/2016   LDLCALC 82 06/29/2015   LDLCALC 87 03/18/2014   Lab Results  Component Value Date     TRIG 101 01/03/2016   TRIG 136 06/29/2015   TRIG 68 03/18/2014   Lab Results  Component Value Date   CHOLHDL 2.7 01/03/2016   CHOLHDL 2.7 06/29/2015   CHOLHDL 2.7 03/18/2014   No results found for: LDLDIRECT    Echo:  pending  ASSESSMENT AND PLAN:   1. Atrial flutter/ history of afib The patient has symptomatic atrial arrhythmias.  Given her morbid obesity and noncompliance with CPAP for OSA, I worry that we will have very little to offer her long term for arrhythmia management if she does not make diligent lifestyle change. I have discussed the importance of weight reduction and compliance with CPAP at length today.  Recent data suggesting mortality benefit from bariatric surgery was also discussed.  She will contemplate this option. In the interim, continue IV diltiazem for now.  She has been initiated on xarelto already.  Her chads2vasc score is at least 5.  She should remain on anticoagulation long term. We discussed TEE with cardioversion or ablation as options.  I have informed her that she will likely have atrial arrhythmias ongoing despite atrial flutter ablation without lifestyle remedy.  I have also discussed increased risks of ablation given her size. At this time, she would prefer TEE guided cardioversion with lifestyle modification. I have therefore made NPO.  Order for TEE and cardioversion placed. Echo is pending  2. Obesity Body mass index is 59.15 kg/m. As above Would refer back to bariatric surgical team upon discharge  3. OSA Compliance with CPAP encouraged As above  4. HTN Stable No change required today  5. Chest pain Atypical  Likely due to RVR and improved with rate control No further inpatient workup planned if echo is low risk.  EP to follow while here  Hillis Range, MD 07/15/2016  1:11 PM

## 2016-07-15 NOTE — Progress Notes (Signed)
HR remains in 130-140s increased cardizem gtt to 22ml/hr.

## 2016-07-16 ENCOUNTER — Inpatient Hospital Stay (HOSPITAL_COMMUNITY): Payer: Medicare Other | Admitting: Certified Registered"

## 2016-07-16 ENCOUNTER — Inpatient Hospital Stay (HOSPITAL_COMMUNITY): Payer: Medicare Other

## 2016-07-16 ENCOUNTER — Encounter (HOSPITAL_COMMUNITY)
Admission: EM | Disposition: A | Discharge status: Home / Self Care | Payer: Self-pay | Source: Home / Self Care | Attending: Internal Medicine

## 2016-07-16 ENCOUNTER — Encounter (HOSPITAL_COMMUNITY): Payer: Self-pay | Admitting: *Deleted

## 2016-07-16 DIAGNOSIS — I4892 Unspecified atrial flutter: Secondary | ICD-10-CM

## 2016-07-16 DIAGNOSIS — I349 Nonrheumatic mitral valve disorder, unspecified: Secondary | ICD-10-CM

## 2016-07-16 DIAGNOSIS — I483 Typical atrial flutter: Secondary | ICD-10-CM

## 2016-07-16 DIAGNOSIS — I484 Atypical atrial flutter: Secondary | ICD-10-CM

## 2016-07-16 DIAGNOSIS — I340 Nonrheumatic mitral (valve) insufficiency: Secondary | ICD-10-CM

## 2016-07-16 HISTORY — PX: TEE WITHOUT CARDIOVERSION: SHX5443

## 2016-07-16 HISTORY — PX: CARDIOVERSION: SHX1299

## 2016-07-16 LAB — ECHOCARDIOGRAM COMPLETE
HEIGHTINCHES: 68 in
Weight: 6195.2 oz

## 2016-07-16 LAB — GLUCOSE, CAPILLARY
Glucose-Capillary: 119 mg/dL — ABNORMAL HIGH (ref 65–99)
Glucose-Capillary: 117 mg/dL — ABNORMAL HIGH (ref 65–99)
Glucose-Capillary: 127 mg/dL — ABNORMAL HIGH (ref 65–99)
Glucose-Capillary: 119 mg/dL — ABNORMAL HIGH (ref 65–99)

## 2016-07-16 SURGERY — ECHOCARDIOGRAM, TRANSESOPHAGEAL
Anesthesia: Monitor Anesthesia Care

## 2016-07-16 MED ORDER — LIDOCAINE 2% (20 MG/ML) 5 ML SYRINGE
INTRAMUSCULAR | Status: DC | PRN
  Administered 2016-07-16: 100 mg via INTRAVENOUS

## 2016-07-16 MED ORDER — CARVEDILOL 3.125 MG PO TABS
3.1250 mg | ORAL_TABLET | Freq: Two times a day (BID) | ORAL | Status: DC
  Administered 2016-07-16 – 2016-07-17 (×2): 3.125 mg via ORAL
  Filled 2016-07-16 (×2): qty 1

## 2016-07-16 MED ORDER — PERFLUTREN LIPID MICROSPHERE
1.0000 mL | INTRAVENOUS | Status: AC | PRN
  Administered 2016-07-16: 2 mL via INTRAVENOUS
  Filled 2016-07-16: qty 10

## 2016-07-16 MED ORDER — BUTAMBEN-TETRACAINE-BENZOCAINE 2-2-14 % EX AERO
INHALATION_SPRAY | CUTANEOUS | Status: DC | PRN
  Administered 2016-07-16: 2 via TOPICAL

## 2016-07-16 MED ORDER — PROPOFOL 500 MG/50ML IV EMUL
INTRAVENOUS | Status: DC | PRN
  Administered 2016-07-16: 100 ug/kg/min via INTRAVENOUS

## 2016-07-16 MED ORDER — PROPOFOL 10 MG/ML IV BOLUS
INTRAVENOUS | Status: DC | PRN
  Administered 2016-07-16: 30 mg via INTRAVENOUS

## 2016-07-16 MED ORDER — LACTATED RINGERS IV SOLN
INTRAVENOUS | Status: DC | PRN
  Administered 2016-07-16: 10:00:00 via INTRAVENOUS

## 2016-07-16 NOTE — Progress Notes (Signed)
Page to Glory Buff, NP to verify necessity of Echo at bedside; s/p cardioversion in TEE already performed this morning.

## 2016-07-16 NOTE — Anesthesia Preprocedure Evaluation (Signed)
Anesthesia Evaluation  Patient identified by MRN, date of birth, ID band Patient awake    Reviewed: Allergy & Precautions, NPO status , Patient's Chart, lab work & pertinent test results  Airway Mallampati: II   Neck ROM: Limited    Dental  (+) Teeth Intact   Pulmonary sleep apnea , COPD,  COPD inhaler, former smoker,     + decreased breath sounds      Cardiovascular hypertension, +CHF and + DOE  + dysrhythmias  Rhythm:Irregular Rate:Tachycardia     Neuro/Psych    GI/Hepatic   Endo/Other  diabetesHypothyroidism Morbid obesity  Renal/GU      Musculoskeletal   Abdominal (+) + obese, massively  obese  Peds  Hematology   Anesthesia Other Findings   Reproductive/Obstetrics                             Anesthesia Physical Anesthesia Plan  ASA: IV  Anesthesia Plan: MAC   Post-op Pain Management:    Induction: Intravenous  Airway Management Planned: Natural Airway and Simple Face Mask  Additional Equipment:   Intra-op Plan:   Post-operative Plan:   Informed Consent: I have reviewed the patients History and Physical, chart, labs and discussed the procedure including the risks, benefits and alternatives for the proposed anesthesia with the patient or authorized representative who has indicated his/her understanding and acceptance.     Plan Discussed with: CRNA  Anesthesia Plan Comments: (She may very likley get intubated and this discussed c patient, she understands)        Anesthesia Quick Evaluation

## 2016-07-16 NOTE — Progress Notes (Signed)
Pt en route to endo for TEE with transport.

## 2016-07-16 NOTE — Transfer of Care (Signed)
Immediate Anesthesia Transfer of Care Note  Patient: Nancy West  Procedure(s) Performed: Procedure(s): TRANSESOPHAGEAL ECHOCARDIOGRAM (TEE) (N/A) CARDIOVERSION (N/A)  Patient Location: Endoscopy Unit  Anesthesia Type:MAC  Level of Consciousness: awake, alert , oriented and patient cooperative  Airway & Oxygen Therapy: Patient Spontanous Breathing and Patient connected to nasal cannula oxygen  Post-op Assessment: Report given to RN, Post -op Vital signs reviewed and stable and Patient moving all extremities  Post vital signs: Reviewed and stable  Last Vitals:  Vitals:   07/16/16 0628 07/16/16 0940  BP: 136/65 (!) 163/118  Pulse: 90 (!) 139  Resp: 19 (!) 22  Temp: 36.6 C 36.7 C    Last Pain:  Vitals:   07/16/16 0940  TempSrc: Oral         Complications: No apparent anesthesia complications

## 2016-07-16 NOTE — Progress Notes (Signed)
SUBJECTIVE: The patient has stable shortness of breath today. No chest pain. She is concerned about ability to lay flat for 6 hours if we proceed with ablation.   CURRENT MEDICATIONS: . atorvastatin  40 mg Oral q1800  . benazepril  20 mg Oral Daily  . FLUoxetine  20 mg Oral Daily  . fluticasone furoate-vilanterol  1 puff Inhalation Daily  . furosemide  40 mg Oral BID  . levothyroxine  50 mcg Oral QAC breakfast  . pantoprazole  40 mg Oral Daily  . rivaroxaban  20 mg Oral Q supper   . diltiazem (CARDIZEM) infusion 5 mg/hr (07/15/16 2248)    OBJECTIVE: Physical Exam: Vitals:   07/15/16 1122 07/15/16 1500 07/15/16 1949 07/16/16 0628  BP: 131/87 (!) 122/59 (!) 151/53 136/65  Pulse: (!) 140  (!) 102 90  Resp: (!) 22  20 19   Temp: 97.9 F (36.6 C)  98 F (36.7 C) 97.8 F (36.6 C)  TempSrc: Oral  Oral Oral  SpO2: 98%  96% 94%  Weight:    (!) 387 lb 3.2 oz (175.6 kg)  Height:        Intake/Output Summary (Last 24 hours) at 07/16/16 6226 Last data filed at 07/16/16 0600  Gross per 24 hour  Intake           960.83 ml  Output             2300 ml  Net         -1339.17 ml    Telemetry reveals atrial flutter   GEN- The patient is morbidly obese appearing, alert and oriented x 3 today.   Head- normocephalic, atraumatic Eyes-  Sclera clear, conjunctiva pink Ears- hearing intact Oropharynx- clear Neck- supple  Lungs- Clear to ausculation bilaterally, normal work of breathing Heart- Tachycardic regular rate and rhythm  GI- soft, NT, ND, + BS Extremities- no clubbing, cyanosis, +dependent edema Skin- no rash or lesion Psych- euthymic mood, full affect Neuro- strength and sensation are intact  LABS: Basic Metabolic Panel:  Recent Labs  33/35/45 1345 07/15/16 0341  NA 140 144  K 3.6 3.8  CL 100* 101  CO2 30 31  GLUCOSE 153* 107*  BUN 27* 28*  CREATININE 1.27* 1.69*  CALCIUM 9.0 9.1   Liver Function Tests:  Recent Labs  07/14/16 1345  AST 19  ALT 13*    ALKPHOS 89  BILITOT 0.9  PROT 7.7  ALBUMIN 4.0   CBC:  Recent Labs  07/14/16 1345 07/15/16 0341  WBC 9.3 10.8*  NEUTROABS 7.6  --   HGB 10.6* 10.1*  HCT 35.5* 33.8*  MCV 94.2 93.1  PLT 176 183   Cardiac Enzymes:  Recent Labs  07/14/16 2158 07/15/16 0341 07/15/16 1127  TROPONINI 0.05* 0.05* 0.05*   D-Dimer:  Recent Labs  07/14/16 1345  DDIMER 2.31*   Thyroid Function Tests:  Recent Labs  07/15/16 0341  TSH 3.919    RADIOLOGY: Dg Chest 2 View Result Date: 07/14/2016 CLINICAL DATA:  Pt reports sharp back pain in upper right shoulder blade yesterday that "punched through pt" lasting about 5 minutes. Pt states since then, she has felt shaky and had cold sweats EXAM: CHEST  2 VIEW COMPARISON:  Radiograph 10/31/2015 FINDINGS: Stable enlarged cardiac silhouette. Central venous pulmonary congestion increased from prior. New focus of consolidation. No pleural fluid. IMPRESSION: Cardiomegaly and central venous congestion suggest mild congestive heart failure Electronically Signed   By: Genevive Bi M.D.   On: 07/14/2016 14:17  Ct Angio Chest Pe W Or Wo Contrast Result Date: 07/14/2016 CLINICAL DATA:  Elevated D-dimer, chest pain. EXAM: CT ANGIOGRAPHY CHEST WITH CONTRAST TECHNIQUE: Multidetector CT imaging of the chest was performed using the standard protocol during bolus administration of intravenous contrast. Multiplanar CT image reconstructions and MIPs were obtained to evaluate the vascular anatomy. CONTRAST:  80 cc Isovue COMPARISON:  Chest radiograph 07/14/2016 FINDINGS: Cardiovascular: Exam is somewhat limited by patient body habitus. No filling defects within the proximal pulmonary arteries. The lower lobe pulmonary arteries are not well evaluated. Mediastinum/Nodes: No axillary supraclavicular adenopathy. No mediastinal adenopathy. Esophagus normal. Lungs/Pleura: No pulmonary infarction. No consolidation. Mild interstitial thickening. Minimal atelectasis. No  effusion. Upper Abdomen: Limited view of the liver, kidneys, pancreas are unremarkable. Normal adrenal glands. Musculoskeletal: No aggressive osseous lesion. Review of the MIP images confirms the above findings. IMPRESSION: 1. No evidence acute pulmonary embolism in suboptimal exam due to patient body habitus. 2. Mild interstitial edema. 3. No overt pulmonary edema, infarction or pneumonia. Electronically Signed   By: Genevive Bi M.D.   On: 07/14/2016 16:38    ASSESSMENT AND PLAN:  Active Problems:   Chest pain   Atrial flutter (HCC)   1.  Atrial flutter/prior atrial fibrillation The patient has persistent atrial flutter with RVR despite cardizem drip Options include TEE/DCCV vs ablation - she is concerned about her ability to lie flat for 6 hours after ablation 2/2 shortness of breath and a feeling of "choking".  With prior atrial fibrillation, TEE/DCCV is reasonable as first step at this time As per Dr Jenel Lucks note, without significant lifestyle changes, she is likely to have recurrent atrial arrhythmias going forward.  2.  Morbid obesity Body mass index is 58.87 kg/m. Refer to bariatric surgery at discharge  3.  OSA Compliance with CPAP encouraged  4.  HTN Stable No change required today  5.  Chest pain Atypical  No recent recurrence Will await echo   Gypsy Balsam, NP 07/16/2016 8:16 AM  EP Attending  Patient seen and examined. Agree with the findings as noted above. The patient has what appears to be both atypical atrial flutter and atrial fib. He weight makes considering catheter ablation a challenge. It is reasonable to try and DCCV but I would be surprised if long term she will maintain NSR. I suspect she will unltimately require a strategy of rate control. I discussed her morbid obesity and treatment. She considered trying bariatric surgery but decided against this. " I just want to be happy and eat what I want as long as I can".   Leonia Reeves.D.

## 2016-07-16 NOTE — Op Note (Signed)
Procedure: Electrical Cardioversion Indications:  Atrial Flutter  Procedure Details:  Consent: Risks of procedure as well as the alternatives and risks of each were explained to the (patient/caregiver).  Consent for procedure obtained.  Time Out: Verified patient identification, verified procedure, site/side was marked, verified correct patient position, special equipment/implants available, medications/allergies/relevent history reviewed, required imaging and test results available.  Performed  Patient placed on cardiac monitor, pulse oximetry, supplemental oxygen as necessary.  Sedation given: IV propofol, Dr. Jacklynn Bue Pacer pads placed anterior and posterior chest.  Cardioverted 1 time(s).  Cardioversion with synchronized biphasic 150J shock.  Evaluation: Findings: Post procedure EKG shows: NSR Complications: None Patient did tolerate procedure well.  Time Spent Directly with the Patient:  30 minutes   Elianne Gubser 07/16/2016, 10:39 AM

## 2016-07-16 NOTE — Progress Notes (Signed)
  Echocardiogram Echocardiogram Transesophageal has been performed.  Janalyn Harder 07/16/2016, 10:48 AM

## 2016-07-16 NOTE — CV Procedure (Signed)
INDICATIONS: atrial flutter  PROCEDURE:   Informed consent was obtained prior to the procedure. The risks, benefits and alternatives for the procedure were discussed and the patient comprehended these risks.  Risks include, but are not limited to, cough, sore throat, vomiting, nausea, somnolence, esophageal and stomach trauma or perforation, bleeding, low blood pressure, aspiration, pneumonia, infection, trauma to the teeth and death.    After a procedural time-out, the oropharynx was anesthetized with 20% benzocaine spray.   During this procedure the patient was administered IV propofol by Anesthesiology (Dr. Jacklynn Bue) for sedation.  The transesophageal probe was inserted in the esophagus and stomach without difficulty and multiple views were obtained.  The patient was kept under observation until the patient left the procedure room.  The patient left the procedure room in stable condition.   Agitated microbubble saline contrast was not administered.  COMPLICATIONS:    There were no immediate complications.  FINDINGS:  Moderately depressed LV function, EF 35-40% Markedly dilated right atrium. Normal size left atrium. No LA thrombus. Moderately dilated right ventricle with depressed systolic function. Moderate MR. Mild TR.  RECOMMENDATIONS:    Proceed with DCCV.  Time Spent Directly with the Patient:  30 minutes   Shalicia Craghead 07/16/2016, 10:40 AM

## 2016-07-16 NOTE — Progress Notes (Signed)
Pt had 8 runs of VTACH. Pt asymptomatic. Denies any palpitations nor  chest pains. NSR on monitor. Observed closely

## 2016-07-16 NOTE — Progress Notes (Signed)
Pt with successful cardioversion, TEE demonstrated EF 35-40%. Likely tachycardia mediated. Will stop diltiazem, continue ACE-I and add BB.  If she tolerates current medications, will plan discharge tomorrow with close outpatient follow up in AF clinic.   Gypsy Balsam, NP 07/16/2016 11:15 AM  Leonia Reeves.D.

## 2016-07-16 NOTE — Anesthesia Postprocedure Evaluation (Addendum)
Anesthesia Post Note  Patient: ARLY SALMINEN  Procedure(s) Performed: Procedure(s) (LRB): TRANSESOPHAGEAL ECHOCARDIOGRAM (TEE) (N/A) CARDIOVERSION (N/A)  Patient location during evaluation: Endoscopy Anesthesia Type: MAC Level of consciousness: awake and alert Pain management: pain level controlled Vital Signs Assessment: post-procedure vital signs reviewed and stable Respiratory status: spontaneous breathing, nonlabored ventilation, respiratory function stable and patient connected to nasal cannula oxygen Cardiovascular status: stable and blood pressure returned to baseline Anesthetic complications: no       Last Vitals:  Vitals:   07/16/16 1100 07/16/16 1133  BP: (!) 129/54 (!) 150/74  Pulse: 71 72  Resp: 20 18  Temp:  36.6 C    Last Pain:  Vitals:   07/16/16 1133  TempSrc: Oral                 Whitt Auletta,JAMES TERRILL

## 2016-07-17 ENCOUNTER — Encounter (HOSPITAL_COMMUNITY): Payer: Self-pay | Admitting: Nurse Practitioner

## 2016-07-17 LAB — GLUCOSE, CAPILLARY: GLUCOSE-CAPILLARY: 114 mg/dL — AB (ref 65–99)

## 2016-07-17 MED ORDER — RIVAROXABAN 20 MG PO TABS: 20.0000 mg | ORAL_TABLET | Freq: Every day | ORAL | 1 refills | Status: AC

## 2016-07-17 NOTE — Progress Notes (Signed)
Unable to print pts AVS, due to unreconciled orders.  Page to NP placed, to request this be updated.

## 2016-07-17 NOTE — Consult Note (Signed)
   [facility] CM Inpatient Consult   07/17/2016  [name] February 29, 1946 [id number]   Referral received from nurse regarding patient.  Chart reviewed patient is Nancy West is a 71 y.o. old female with medical history noted below presented with  complaints of palpitations.  She states the night prior to admission these started and she noted her heart rate was in the 140s-150 and didn't seem to budge. Morning of admission it was down in the mid 70s, but again increased back to the 140s this evening and she therefore presented to the ED.  She also complains of some dyspnea, however, she has a history of COPD and notes it is not much worse compared to her baseline.  Has a history of atrial fibrillation which occurred in the context of a large pericardial effusion. This resolved with the effusion per MD notes in H&P. Patient has been assigned [facility]  EMMI calls for COPD.  Met with the patient who states she lives with her husband, has home 02, started back on Xalrelto, but denies difficulty at this time but wanted to know about any coupons to assist with the medication.  She endorses Medicare and Dr. Aris Georgia as her primary care provider.  Explained that she will receive calls and if she has any red zones a [facility] Telephonic nurse will follow up on any issues.  Patient was provided a brochure with a 24 hour nurse advise line magnet.  [facility] Care Management does not replace or interfere with any services arranged by the inpatient staff.  For questions,  Natividad Brood, RN BSN [facility] Liaison  [phone] business mobile phone Toll free office [phone]

## 2016-07-17 NOTE — Progress Notes (Addendum)
Call placed to CCMD to notify of telemetry monitoring d/c for patient discharge to home. IV access discontinued, catheter intact. No complications at the site.   Pt is c/a/ox4, VS WNL, no complaints at this time. Pt is expecting family to bring her O2 for transport home.

## 2016-07-17 NOTE — Progress Notes (Signed)
Progress Note  Patient Name: Nancy West Date of Encounter: 07/17/2016  Primary Cardiologist: Nahser  Subjective   No complaints, feels well after DCCV  Inpatient Medications    Scheduled Meds: . atorvastatin  40 mg Oral q1800  . benazepril  20 mg Oral Daily  . carvedilol  3.125 mg Oral BID WC  . FLUoxetine  20 mg Oral Daily  . fluticasone furoate-vilanterol  1 puff Inhalation Daily  . furosemide  40 mg Oral BID  . levothyroxine  50 mcg Oral QAC breakfast  . pantoprazole  40 mg Oral Daily  . rivaroxaban  20 mg Oral Q supper   Continuous Infusions:  PRN Meds: acetaminophen, ondansetron (ZOFRAN) IV   Vital Signs    Vitals:   07/16/16 1730 07/16/16 2038 07/17/16 0027 07/17/16 0407  BP: (!) 147/66 125/78 (!) 130/52 136/66  Pulse: 80 85 69 72  Resp:  (!) 24 (!) 22 20  Temp:  97.8 F (36.6 C) 97.2 F (36.2 C) 98.2 F (36.8 C)  TempSrc:  Oral Oral Oral  SpO2:  93% 97% 95%  Weight:    (!) 386 lb 1.6 oz (175.1 kg)  Height:        Intake/Output Summary (Last 24 hours) at 07/17/16 0827 Last data filed at 07/17/16 0600  Gross per 24 hour  Intake              800 ml  Output             1600 ml  Net             -800 ml   Filed Weights   07/15/16 0555 07/16/16 0628 07/17/16 0407  Weight: (!) 389 lb (176.4 kg) (!) 387 lb 3.2 oz (175.6 kg) (!) 386 lb 1.6 oz (175.1 kg)    Telemetry    nsr - Personally Reviewed  ECG    nsr - Personally Reviewed  Physical Exam   GEN: No acute distress.  Neck: No JVD Cardiac: RRR, no murmurs, rubs, or gallops.  Respiratory: Clear to auscultation bilaterally. GI: Soft, nontender, non-distended  MS: No edema; No deformity. Neuro:  AAOx3. Psych: Normal affect  Labs    Chemistry Recent Labs Lab 07/14/16 1345 07/15/16 0341  NA 140 144  K 3.6 3.8  CL 100* 101  CO2 30 31  GLUCOSE 153* 107*  BUN 27* 28*  CREATININE 1.27* 1.69*  CALCIUM 9.0 9.1  PROT 7.7  --   ALBUMIN 4.0  --   AST 19  --   ALT 13*  --     ALKPHOS 89  --   BILITOT 0.9  --   GFRNONAA 42* 30*  GFRAA 49* 35*  ANIONGAP 10 12     Hematology Recent Labs Lab 07/14/16 1345 07/15/16 0341  WBC 9.3 10.8*  RBC 3.77* 3.63*  HGB 10.6* 10.1*  HCT 35.5* 33.8*  MCV 94.2 93.1  MCH 28.1 27.8  MCHC 29.9* 29.9*  RDW 14.4 14.7  PLT 176 183    Cardiac Enzymes Recent Labs Lab 07/14/16 1345 07/14/16 2158 07/15/16 0341 07/15/16 1127  TROPONINI 0.03* 0.05* 0.05* 0.05*   No results for input(s): TROPIPOC in the last 168 hours.   BNP Recent Labs Lab 07/14/16 1345  BNP 846.8*     DDimer  Recent Labs Lab 07/14/16 1345  DDIMER 2.31*     Radiology    No results found.  Cardiac Studies   TEE - no LAA thrombus  Patient Profile  71 y.o. female admitted with acute diastolic heart failure due to atrial fib/flutter with a RVR, now s/p DCCV.  Assessment & Plan    1. Atrial fib/flutter - she is s/p DCCV and doing well. Wants to go home.  2. Acute on chronic diastolic heart failure - she is essentially euvolemic. Stable for DC Home on usual home meds. 3. Morbid obesity - after discussion with the patient yesterday, she is not interested in pursuing a weight loss plan or bariatric surgery.  4. Disp. - she is scheduled for followup with Dr. Elease Hashimoto next week.  Signed, Lewayne Bunting, MD  07/17/2016, 8:27 AM  Patient ID: [name], female   DOB: 1945/12/27, 71 y.o.   MRN: [id number]

## 2016-07-17 NOTE — Discharge Summary (Signed)
ELECTROPHYSIOLOGY PROCEDURE DISCHARGE SUMMARY    Patient ID: Nancy West,  MRN: [id number], DOB/AGE: Jan 26, 1946 71 y.o.  Admit date: 07/14/2016 Discharge date: 07/17/2016  Primary Care Physician: Lavonda Jumbo, MD Primary Cardiologist: Nahser  Primary Discharge Diagnosis:  1.  Atrial flutter with RVR s/p TEE/DCCV this admission  Secondary Discharge Diagnosis: 1.  Paroxysmal atrial fibrillation 2.  Morbid obesity 3.  COPD 4.  Hypertension 5.  Hyperlipidemia 6.  Prior pericardial effusion requiring pericardiocentesis 12/2011 7.  Diabetes  Allergies  Allergen Reactions  . Augmentin [Amoxicillin-Pot Clavulanate] Other (See Comments)    Abdominal pain    Procedures This Admission:  1.  TEE/DCCV on 07/16/16 by Dr Royann Shivers. This study demonstrated EF 35-40%, markedly dilated right atrium, no LA thrombus, moderate MR, mild TR. She then underwent successful cardioversion. There were no early apparent complications.  2.  Echo on 07/16/16 demonstrated EF 45-50%, moderate concentric hypertrophy, grade 2 diastolic dysfunction, PA pressure 52, mild pericardial effusion, LA 51  Brief HPI/Hospital Course:  Nancy West is a 71 y.o. female with a past medical history as outlined above. She presented on the day of admission with palpitations that persisted for several hours. She was found to be in atrial flutter with RVR and was admitted for management.  She was placed on diltiazem drip and started on anticoagulation. She was seen by Dr Johney Frame who discussed treatment options including TEE/DCCV vs ablation. With morbid obesity, she was not felt to be a candidate for EP procedures and underwent TEE/DCCV on 07/16/16. TEE demonstrated mildly depressed EF likely tachycardia mediated. Medications were adjusted to include BB and her CCB was discontinued. On the day of discharge, she reports that her shortness of breath is at baseline. She was examined by Dr Ladona Ridgel and considered stable for discharge  to home.  She has close outpatient follow up already scheduled for next week with Dr Elease Hashimoto.   Lifestyle was discussed at length with patient this admission. She is willing to again pursue bariatric surgery.  Importance of compliance with CPAP was also discussed. Without significant lifestyle changes, prognosis is poor.   She will require long term anticoagulation for CHADS2VASC of 4.    Physical Exam: Vitals:   07/16/16 1730 07/16/16 2038 07/17/16 0027 07/17/16 0407  BP: (!) 147/66 125/78 (!) 130/52 136/66  Pulse: 80 85 69 72  Resp:  (!) 24 (!) 22 20  Temp:  97.8 F (36.6 C) 97.2 F (36.2 C) 98.2 F (36.8 C)  TempSrc:  Oral Oral Oral  SpO2:  93% 97% 95%  Weight:    (!) 386 lb 1.6 oz (175.1 kg)  Height:        GEN- The patient is morbidly obese appearing, alert and oriented x 3 today.   HEENT: normocephalic, atraumatic; sclera clear, conjunctiva pink; hearing intact; oropharynx clear; neck supple  Lungs- Clear to ausculation bilaterally, normal work of breathing.  No wheezes, rales, rhonchi Heart- Regular rate and rhythm  GI- soft, non-tender, non-distended, bowel sounds present  Extremities- no clubbing, cyanosis, 1+ BLE edema, +venous stasis changes  MS- no significant deformity or atrophy Skin- warm and dry, no rash or lesion Psych- euthymic mood, full affect Neuro- strength and sensation are intact    Labs:   Lab Results  Component Value Date   WBC 10.8 (H) 07/15/2016   HGB 10.1 (L) 07/15/2016   HCT 33.8 (L) 07/15/2016   MCV 93.1 07/15/2016   PLT 183 07/15/2016     Recent Labs  Lab 07/14/16 1345 07/15/16 0341  NA 140 144  K 3.6 3.8  CL 100* 101  CO2 30 31  BUN 27* 28*  CREATININE 1.27* 1.69*  CALCIUM 9.0 9.1  PROT 7.7  --   BILITOT 0.9  --   ALKPHOS 89  --   ALT 13*  --   AST 19  --   GLUCOSE 153* 107*     Discharge Medications:  Current Discharge Medication List    START taking these medications   Details  rivaroxaban (XARELTO) 20 MG TABS  tablet Take 1 tablet (20 mg total) by mouth daily with supper. Qty: 30 tablet, Refills: 1      CONTINUE these medications which have NOT CHANGED   Details  aspirin EC 81 MG EC tablet Take 1 tablet (81 mg total) by mouth daily.    atorvastatin (LIPITOR) 40 MG tablet TAKE 1 TABLET(40 MG) BY MOUTH DAILY Qty: 90 tablet, Refills: 0   Associated Diagnoses: Pure hypercholesterolemia    benazepril (LOTENSIN) 20 MG tablet Take 1 tablet (20 mg total) by mouth daily. Qty: 30 tablet, Refills: 0    Cholecalciferol (VITAMIN D) 1000 UNITS capsule Take 1,000 Units by mouth daily.     FLUoxetine (PROZAC) 20 MG capsule Take 1 capsule (20 mg total) by mouth daily. Qty: 90 capsule, Refills: 1   Associated Diagnoses: Major depressive disorder, single episode, moderate (HCC)    furosemide (LASIX) 40 MG tablet TAKE 1 TABLET BY MOUTH TWICE DAILY Qty: 180 tablet, Refills: 3    levothyroxine (SYNTHROID, LEVOTHROID) 50 MCG tablet TAKE 1 TABLET(50 MCG) BY MOUTH DAILY Qty: 90 tablet, Refills: 0   Associated Diagnoses: Hypothyroidism    metoprolol tartrate (LOPRESSOR) 25 MG tablet Take 1 tablet (25 mg total) by mouth 2 (two) times daily. Qty: 60 tablet, Refills: 0    Multiple Vitamins-Minerals (ONE-A-DAY WOMENS 50+ ADVANTAGE) TABS Take 1 tablet by mouth daily after breakfast.    omeprazole (PRILOSEC) 20 MG capsule Take 1 capsule (20 mg total) by mouth 2 (two) times daily.    potassium chloride (K-DUR,KLOR-CON) 10 MEQ tablet Take 1 tablet (10 mEq total) by mouth daily. Qty: 90 tablet, Refills: 0    SYMBICORT 160-4.5 MCG/ACT inhaler INHALE 2 PUFFS INTO THE LUNGS TWICE DAILY Qty: 10.2 g, Refills: 2    Albuterol Sulfate (PROAIR RESPICLICK) 108 (90 BASE) MCG/ACT AEPB Inhale 2 puffs into the lungs every 6 (six) hours as needed. Qty: 1 each, Refills: 5      STOP taking these medications     diltiazem (CARTIA XT) 240 MG 24 hr capsule      mupirocin cream (BACTROBAN) 2 %         Disposition: Pt is  being discharged home today in good condition. Discharge Instructions    Diet - low sodium heart healthy    Complete by:  As directed    Increase activity slowly    Complete by:  As directed      Follow-up Information    Kristeen Miss, MD Follow up on 07/24/2016.   Specialty:  Cardiology Why:  at [facility] information: [address] [place] [address] [phone]           Duration of Discharge Encounter: Greater than 30 minutes including physician time.  Signed, Gypsy Balsam, NP 07/17/2016 8:26 AM  EP Attending  Patient seen and examined. Agree with above. See my clinic note for additional evaluation. The patient is much better after DCCV. She will  follow up with Dr. Elease Hashimoto next week. See my clinic note as well.  Leonia Reeves.D.

## 2016-07-22 ENCOUNTER — Other Ambulatory Visit: Payer: Self-pay | Admitting: Family Medicine

## 2016-07-22 DIAGNOSIS — E7800 Pure hypercholesterolemia, unspecified: Secondary | ICD-10-CM

## 2016-07-23 ENCOUNTER — Emergency Department (HOSPITAL_COMMUNITY): Payer: Medicare Other

## 2016-07-23 ENCOUNTER — Inpatient Hospital Stay (HOSPITAL_COMMUNITY)
Admission: EM | Admit: 2016-07-23 | Discharge: 2016-07-27 | DRG: 308 | Disposition: A | Discharge status: Home / Self Care | Payer: Medicare Other | Attending: Internal Medicine | Admitting: Internal Medicine

## 2016-07-23 ENCOUNTER — Encounter (HOSPITAL_COMMUNITY): Payer: Self-pay

## 2016-07-23 DIAGNOSIS — Z833 Family history of diabetes mellitus: Secondary | ICD-10-CM

## 2016-07-23 DIAGNOSIS — G4733 Obstructive sleep apnea (adult) (pediatric): Secondary | ICD-10-CM | POA: Diagnosis present

## 2016-07-23 DIAGNOSIS — Z6841 Body Mass Index (BMI) 40.0 and over, adult: Secondary | ICD-10-CM | POA: Diagnosis not present

## 2016-07-23 DIAGNOSIS — Z8249 Family history of ischemic heart disease and other diseases of the circulatory system: Secondary | ICD-10-CM

## 2016-07-23 DIAGNOSIS — I5043 Acute on chronic combined systolic (congestive) and diastolic (congestive) heart failure: Secondary | ICD-10-CM | POA: Diagnosis not present

## 2016-07-23 DIAGNOSIS — Z79899 Other long term (current) drug therapy: Secondary | ICD-10-CM

## 2016-07-23 DIAGNOSIS — Z7901 Long term (current) use of anticoagulants: Secondary | ICD-10-CM

## 2016-07-23 DIAGNOSIS — Z881 Allergy status to other antibiotic agents status: Secondary | ICD-10-CM

## 2016-07-23 DIAGNOSIS — I5032 Chronic diastolic (congestive) heart failure: Secondary | ICD-10-CM | POA: Diagnosis present

## 2016-07-23 DIAGNOSIS — E785 Hyperlipidemia, unspecified: Secondary | ICD-10-CM | POA: Diagnosis present

## 2016-07-23 DIAGNOSIS — I429 Cardiomyopathy, unspecified: Secondary | ICD-10-CM | POA: Diagnosis not present

## 2016-07-23 DIAGNOSIS — I5021 Acute systolic (congestive) heart failure: Secondary | ICD-10-CM

## 2016-07-23 DIAGNOSIS — I313 Pericardial effusion (noninflammatory): Secondary | ICD-10-CM | POA: Diagnosis present

## 2016-07-23 DIAGNOSIS — R0602 Shortness of breath: Secondary | ICD-10-CM | POA: Diagnosis not present

## 2016-07-23 DIAGNOSIS — E662 Morbid (severe) obesity with alveolar hypoventilation: Secondary | ICD-10-CM | POA: Diagnosis present

## 2016-07-23 DIAGNOSIS — I4819 Other persistent atrial fibrillation: Secondary | ICD-10-CM | POA: Diagnosis present

## 2016-07-23 DIAGNOSIS — E119 Type 2 diabetes mellitus without complications: Secondary | ICD-10-CM

## 2016-07-23 DIAGNOSIS — Z87891 Personal history of nicotine dependence: Secondary | ICD-10-CM

## 2016-07-23 DIAGNOSIS — Z9049 Acquired absence of other specified parts of digestive tract: Secondary | ICD-10-CM

## 2016-07-23 DIAGNOSIS — I428 Other cardiomyopathies: Secondary | ICD-10-CM

## 2016-07-23 DIAGNOSIS — I3139 Other pericardial effusion (noninflammatory): Secondary | ICD-10-CM | POA: Diagnosis present

## 2016-07-23 DIAGNOSIS — I4510 Unspecified right bundle-branch block: Secondary | ICD-10-CM | POA: Diagnosis present

## 2016-07-23 DIAGNOSIS — I493 Ventricular premature depolarization: Secondary | ICD-10-CM | POA: Diagnosis present

## 2016-07-23 DIAGNOSIS — I481 Persistent atrial fibrillation: Secondary | ICD-10-CM | POA: Diagnosis present

## 2016-07-23 DIAGNOSIS — I081 Rheumatic disorders of both mitral and tricuspid valves: Secondary | ICD-10-CM | POA: Diagnosis not present

## 2016-07-23 DIAGNOSIS — E039 Hypothyroidism, unspecified: Secondary | ICD-10-CM | POA: Diagnosis present

## 2016-07-23 DIAGNOSIS — R531 Weakness: Secondary | ICD-10-CM | POA: Diagnosis not present

## 2016-07-23 DIAGNOSIS — I878 Other specified disorders of veins: Secondary | ICD-10-CM | POA: Diagnosis present

## 2016-07-23 DIAGNOSIS — Z7951 Long term (current) use of inhaled steroids: Secondary | ICD-10-CM

## 2016-07-23 DIAGNOSIS — I4892 Unspecified atrial flutter: Principal | ICD-10-CM | POA: Diagnosis present

## 2016-07-23 DIAGNOSIS — I110 Hypertensive heart disease with heart failure: Secondary | ICD-10-CM | POA: Diagnosis present

## 2016-07-23 DIAGNOSIS — Z7982 Long term (current) use of aspirin: Secondary | ICD-10-CM

## 2016-07-23 DIAGNOSIS — I4439 Other atrioventricular block: Secondary | ICD-10-CM | POA: Diagnosis present

## 2016-07-23 DIAGNOSIS — I10 Essential (primary) hypertension: Secondary | ICD-10-CM | POA: Diagnosis present

## 2016-07-23 DIAGNOSIS — Z803 Family history of malignant neoplasm of breast: Secondary | ICD-10-CM

## 2016-07-23 DIAGNOSIS — E559 Vitamin D deficiency, unspecified: Secondary | ICD-10-CM | POA: Diagnosis present

## 2016-07-23 DIAGNOSIS — I5033 Acute on chronic diastolic (congestive) heart failure: Secondary | ICD-10-CM | POA: Diagnosis not present

## 2016-07-23 DIAGNOSIS — Z8261 Family history of arthritis: Secondary | ICD-10-CM

## 2016-07-23 DIAGNOSIS — K219 Gastro-esophageal reflux disease without esophagitis: Secondary | ICD-10-CM | POA: Diagnosis present

## 2016-07-23 DIAGNOSIS — Z9981 Dependence on supplemental oxygen: Secondary | ICD-10-CM

## 2016-07-23 DIAGNOSIS — I480 Paroxysmal atrial fibrillation: Secondary | ICD-10-CM | POA: Diagnosis present

## 2016-07-23 DIAGNOSIS — E669 Obesity, unspecified: Secondary | ICD-10-CM

## 2016-07-23 DIAGNOSIS — I4891 Unspecified atrial fibrillation: Secondary | ICD-10-CM | POA: Diagnosis present

## 2016-07-23 DIAGNOSIS — Z800 Family history of malignant neoplasm of digestive organs: Secondary | ICD-10-CM

## 2016-07-23 DIAGNOSIS — J449 Chronic obstructive pulmonary disease, unspecified: Secondary | ICD-10-CM | POA: Diagnosis present

## 2016-07-23 DIAGNOSIS — Z9119 Patient's noncompliance with other medical treatment and regimen: Secondary | ICD-10-CM

## 2016-07-23 DIAGNOSIS — Z818 Family history of other mental and behavioral disorders: Secondary | ICD-10-CM

## 2016-07-23 DIAGNOSIS — R000 Tachycardia, unspecified: Secondary | ICD-10-CM | POA: Diagnosis not present

## 2016-07-23 LAB — CBC WITH DIFFERENTIAL/PLATELET
BASOS ABS: 0.1 10*3/uL (ref 0.0–0.1)
Basophils Relative: 0 %
Eosinophils Absolute: 0 10*3/uL (ref 0.0–0.7)
Eosinophils Relative: 0 %
HEMATOCRIT: 37.7 % (ref 36.0–46.0)
HEMOGLOBIN: 11.3 g/dL — AB (ref 12.0–15.0)
Lymphocytes Relative: 16 %
Lymphs Abs: 1.9 10*3/uL (ref 0.7–4.0)
MCH: 28 pg (ref 26.0–34.0)
MCHC: 30 g/dL (ref 30.0–36.0)
MCV: 93.3 fL (ref 78.0–100.0)
Monocytes Absolute: 1 10*3/uL (ref 0.1–1.0)
Monocytes Relative: 8 %
NEUTROS ABS: 9.2 10*3/uL — AB (ref 1.7–7.7)
NEUTROS PCT: 76 %
Platelets: 248 10*3/uL (ref 150–400)
RBC: 4.04 MIL/uL (ref 3.87–5.11)
RDW: 14.6 % (ref 11.5–15.5)
WBC: 12.2 10*3/uL — ABNORMAL HIGH (ref 4.0–10.5)

## 2016-07-23 LAB — COMPREHENSIVE METABOLIC PANEL
ALK PHOS: 69 U/L (ref 38–126)
ALT: 24 U/L (ref 14–54)
AST: 51 U/L — AB (ref 15–41)
Albumin: 3.8 g/dL (ref 3.5–5.0)
Anion gap: 13 (ref 5–15)
BILIRUBIN TOTAL: 1.4 mg/dL — AB (ref 0.3–1.2)
BUN: 25 mg/dL — AB (ref 6–20)
CALCIUM: 9 mg/dL (ref 8.9–10.3)
CO2: 28 mmol/L (ref 22–32)
CREATININE: 1.27 mg/dL — AB (ref 0.44–1.00)
Chloride: 100 mmol/L — ABNORMAL LOW (ref 101–111)
GFR calc Af Amer: 49 mL/min — ABNORMAL LOW (ref >60)
GFR calc non Af Amer: 42 mL/min — ABNORMAL LOW (ref >60)
GLUCOSE: 98 mg/dL (ref 65–99)
POTASSIUM: 5.8 mmol/L — AB (ref 3.5–5.1)
Sodium: 141 mmol/L (ref 135–145)
TOTAL PROTEIN: 6.9 g/dL (ref 6.5–8.1)

## 2016-07-23 LAB — GLUCOSE, CAPILLARY: GLUCOSE-CAPILLARY: 153 mg/dL — AB (ref 65–99)

## 2016-07-23 LAB — I-STAT TROPONIN, ED
Troponin i, poc: 0.02 ng/mL (ref 0.00–0.08)
Troponin i, poc: 0.02 ng/mL (ref 0.00–0.08)

## 2016-07-23 LAB — CBG MONITORING, ED: GLUCOSE-CAPILLARY: 113 mg/dL — AB (ref 65–99)

## 2016-07-23 LAB — BRAIN NATRIURETIC PEPTIDE: B Natriuretic Peptide: 878.7 pg/mL — ABNORMAL HIGH (ref 0.0–100.0)

## 2016-07-23 MED ORDER — DEXTROSE 5 % IV SOLN
5.0000 mg/h | INTRAVENOUS | Status: DC
  Administered 2016-07-23: 5 mg/h via INTRAVENOUS
  Filled 2016-07-23 (×2): qty 100

## 2016-07-23 MED ORDER — DILTIAZEM LOAD VIA INFUSION
15.0000 mg | Freq: Once | INTRAVENOUS | Status: AC
  Administered 2016-07-23: 15 mg via INTRAVENOUS
  Filled 2016-07-23: qty 15

## 2016-07-23 MED ORDER — FUROSEMIDE 10 MG/ML IJ SOLN
40.0000 mg | Freq: Two times a day (BID) | INTRAMUSCULAR | Status: DC
  Administered 2016-07-24: 40 mg via INTRAVENOUS
  Filled 2016-07-23: qty 4

## 2016-07-23 MED ORDER — SODIUM CHLORIDE 0.9 % IV SOLN: 250.0000 mL | INTRAVENOUS | Status: DC | PRN

## 2016-07-23 MED ORDER — NITROGLYCERIN 0.4 MG SL SUBL: 0.4000 mg | SUBLINGUAL_TABLET | SUBLINGUAL | Status: DC | PRN

## 2016-07-23 MED ORDER — INSULIN ASPART 100 UNIT/ML ~~LOC~~ SOLN
0.0000 [IU] | Freq: Three times a day (TID) | SUBCUTANEOUS | Status: DC
  Administered 2016-07-25: 3 [IU] via SUBCUTANEOUS
  Administered 2016-07-26: 2 [IU] via SUBCUTANEOUS

## 2016-07-23 MED ORDER — ASPIRIN EC 81 MG PO TBEC
81.0000 mg | DELAYED_RELEASE_TABLET | Freq: Every day | ORAL | Status: DC
  Administered 2016-07-24 – 2016-07-27 (×4): 81 mg via ORAL
  Filled 2016-07-23 (×4): qty 1

## 2016-07-23 MED ORDER — ACETAMINOPHEN 325 MG PO TABS: 650.0000 mg | ORAL_TABLET | ORAL | Status: DC | PRN

## 2016-07-23 MED ORDER — INSULIN ASPART 100 UNIT/ML ~~LOC~~ SOLN: 0.0000 [IU] | Freq: Every day | SUBCUTANEOUS | Status: DC

## 2016-07-23 MED ORDER — ONDANSETRON HCL 4 MG/2ML IJ SOLN: 4.0000 mg | Freq: Four times a day (QID) | INTRAMUSCULAR | Status: DC | PRN

## 2016-07-23 NOTE — ED Triage Notes (Signed)
Pt with hx of A fib presents via EMS with c/o A fib and dyspnea with exertion since last night. Pt seen in ED on Monday for same and was cardioverted to NSR. Pt denies CP or SOB at rest. Pt reports lightheadedness and diaphoresis. 159/124, HR 130, 98% 4L [place], pt on O2 at home. Given 324 ASA and 500 ml bolus by EMS. 20 G L hand.

## 2016-07-23 NOTE — ED Provider Notes (Signed)
[facility]-EMERGENCY DEPT Provider Note   CSN: [id number] Arrival date & time: 07/23/16  1728     History   Chief Complaint Chief Complaint  Patient presents with  . Atrial Fibrillation    HPI Nancy West is a 71 y.o. female.  HPI   Presents with concern of palpitations, shortness of breath and chest pain that began yesterday. Reports she had the same symptoms on January 20 prior to her admission and cardioversion with cardiology. Describes the pain as the dull ache within the left side of her chest, as well as pain below her right scapula that radiates to her back. It is associated with shortness of breath, diaphoresis and sensation of heart racing.  Denies cough, fever, body aches. Reports over the last 2 days she's had an increase in lower extremity swelling.  Past Medical History:  Diagnosis Date  . Anemia    Noted 12/2011  . Atrial fib/flutter, transient    In setting of pericardial effusion 12/2011  . Chest pain    a. 12/2011 Neg Lexi MV, EF 56%.    . Diastolic CHF (HCC)   . GERD (gastroesophageal reflux disease)   . Hyperlipidemia   . Hypertension   . Hypothyroidism   . Obesity   . OSA (obstructive sleep apnea) 11/05/2013   noncompliant with CPAP  . Pericardial effusion    a. Large, s/p pericardiocentesis 01/02/12 yielding fluid (neg malignant cells, only inflammatory cells)  . Pneumonia 09/01/2013   "for the first time" (09/01/2013)  . Tobacco dependence    a. 46 pack yr hx.  . Type 2 diabetes, diet controlled (HCC)   . Venous stasis of lower extremity    a. chronic LEE    Patient Active Problem List   Diagnosis Date Noted  . Chronic anticoagulation 07/23/2016  . Non-ischemic cardiomyopathy (HCC) 07/23/2016  . RBBB 07/23/2016  . Atrial fibrillation with RVR (HCC) 07/23/2016  . Atrial flutter with rapid ventricular response (HCC) 07/14/2016  . Controlled diabetes mellitus with microalbuminuria (HCC) 01/25/2016  . Major depressive disorder, single  episode, moderate (HCC) 11/29/2014  . Advance care planning 11/29/2014  . OSA (obstructive sleep apnea) 11/05/2013  . Obesity hypoventilation syndrome (HCC) 11/05/2013  . COPD (chronic obstructive pulmonary disease) with chronic bronchitis (HCC) 11/05/2013  . Dyspnea 09/01/2013  . CHF exacerbation (HCC) 09/01/2013  . Chest pain 01/21/2012  . Pericardial effusion- July 2013 01/02/2012  . Atrial fibrillation (HCC) 01/02/2012  . Super Obesity (BMI 58.8) 12/13/2011  . Chronic diastolic heart failure (HCC) 02/21/2011  . Diet-controlled diabetes mellitus (HCC) 11/17/2010  . Essential hypertension, benign 11/17/2010  . Pure hypercholesterolemia 11/17/2010  . Hypothyroidism 11/17/2010  . Unspecified vitamin D deficiency 11/17/2010    Past Surgical History:  Procedure Laterality Date  . APPENDECTOMY  ~ 2008  . BREAST BIOPSY Right 1980's   a. Benign   . CARDIOVERSION N/A 07/16/2016   Procedure: CARDIOVERSION;  Surgeon: Thurmon Fair, MD;  Location: [facility] ENDOSCOPY;  Service: Cardiovascular;  Laterality: N/A;  . CHOLECYSTECTOMY  1980's  . PERICARDIAL TAP N/A 01/02/2012   Procedure: PERICARDIAL TAP;  Surgeon: Kathleene Hazel, MD;  Location: [facility] CATH LAB;  Service: Cardiovascular;  Laterality: N/A;  . PERICARDIOCENTESIS  12/2011   yielding fluid (neg malignant cells, only inflammatory cells)/notes 12/30/2011  . TEE WITHOUT CARDIOVERSION N/A 07/16/2016   Procedure: TRANSESOPHAGEAL ECHOCARDIOGRAM (TEE);  Surgeon: Thurmon Fair, MD;  Location: [facility] ENDOSCOPY;  Service: Cardiovascular;  Laterality: N/A;    OB History    Rhett Bannister  Para Term Preterm AB Living   0 0 0 0 0 0   SAB TAB Ectopic Multiple Live Births   0 0 0 0         Home Medications    Prior to Admission medications   Medication Sig Start Date End Date Taking? Authorizing Provider  acetaminophen (TYLENOL) 650 MG CR tablet Take 650 mg by mouth every 8 (eight) hours as needed (for back pain).   Yes Historical Provider, MD    aspirin EC 81 MG EC tablet Take 1 tablet (81 mg total) by mouth daily. 09/05/13  Yes Vassie Loll, MD  atorvastatin (LIPITOR) 40 MG tablet TAKE 1 TABLET(40 MG) BY MOUTH DAILY 07/23/16  Yes Joselyn Arrow, MD  benazepril (LOTENSIN) 20 MG tablet Take 1 tablet (20 mg total) by mouth daily. 07/13/16  Yes Vesta Mixer, MD  Cholecalciferol (VITAMIN D) 1000 UNITS capsule Take 1,000 Units by mouth daily.    Yes Historical Provider, MD  FLUoxetine (PROZAC) 20 MG capsule Take 1 capsule (20 mg total) by mouth daily. 05/14/16  Yes Joselyn Arrow, MD  furosemide (LASIX) 40 MG tablet TAKE 1 TABLET BY MOUTH TWICE DAILY 05/31/15  Yes Vesta Mixer, MD  levothyroxine (SYNTHROID, LEVOTHROID) 50 MCG tablet TAKE 1 TABLET(50 MCG) BY MOUTH DAILY 07/12/16  Yes Joselyn Arrow, MD  metoprolol tartrate (LOPRESSOR) 25 MG tablet Take 1 tablet (25 mg total) by mouth 2 (two) times daily. 07/13/16  Yes Vesta Mixer, MD  Multiple Vitamins-Minerals (ONE-A-DAY WOMENS 50+ ADVANTAGE) TABS Take 1 tablet by mouth daily after breakfast.   Yes Historical Provider, MD  mupirocin cream (BACTROBAN) 2 % Apply 1 application topically 2 (two) times daily. To affected areas near tailbone 05/14/16  Yes Historical Provider, MD  omeprazole (PRILOSEC) 20 MG capsule Take 1 capsule (20 mg total) by mouth 2 (two) times daily. 01/21/12  Yes Rosalio Macadamia, NP  potassium chloride (K-DUR,KLOR-CON) 10 MEQ tablet Take 1 tablet (10 mEq total) by mouth daily. 07/13/16  Yes Vesta Mixer, MD  rivaroxaban (XARELTO) 20 MG TABS tablet Take 1 tablet (20 mg total) by mouth daily with supper. 07/17/16  Yes Amber Caryl Bis, NP  SYMBICORT 160-4.5 MCG/ACT inhaler INHALE 2 PUFFS INTO THE LUNGS TWICE DAILY 07/13/16  Yes Coralyn Helling, MD  Albuterol Sulfate (PROAIR RESPICLICK) 108 (90 BASE) MCG/ACT AEPB Inhale 2 puffs into the lungs every 6 (six) hours as needed. Patient not taking: Reported on 07/23/2016 03/12/14   Coralyn Helling, MD    Family History Family History  Problem Relation  Age of Onset  . Breast cancer Mother 24  . Hypertension Mother   . Arthritis Mother   . Anxiety disorder Mother   . Colon cancer Mother   . Diabetes Father     died in MVA  . Healthy Sister     Social History Social History  Substance Use Topics  . Smoking status: Former Smoker    Years: 50.00    Types: Cigarettes    Quit date: 12/16/2013  . Smokeless tobacco: Never Used  . Alcohol use No     Allergies   Augmentin [amoxicillin-pot clavulanate]   Review of Systems Review of Systems  Constitutional: Positive for diaphoresis. Negative for fever.  HENT: Negative for sore throat.   Eyes: Negative for visual disturbance.  Respiratory: Positive for shortness of breath. Negative for cough.   Cardiovascular: Positive for chest pain, palpitations and leg swelling.  Gastrointestinal: Negative for abdominal pain, nausea and vomiting.  Genitourinary:  Negative for difficulty urinating.  Musculoskeletal: Positive for back pain. Negative for neck pain.  Skin: Negative for rash.  Neurological: Negative for syncope and headaches.     Physical Exam Updated Vital Signs BP 127/64 (BP Location: Right Arm)   Pulse (!) 101   Temp 98.2 F (36.8 C) (Oral)   Resp 20   Ht 5\' 8"  (1.727 m)   Wt (!) 397 lb 1.6 oz (180.1 kg)   SpO2 95%   BMI 60.38 kg/m   Physical Exam  Constitutional: She is oriented to person, place, and time. She appears well-developed and well-nourished. No distress.  HENT:  Head: Normocephalic and atraumatic.  Eyes: Conjunctivae and EOM are normal.  Neck: Normal range of motion.  Cardiovascular: Regular rhythm, normal heart sounds and intact distal pulses.  Tachycardia present.  Exam reveals no gallop and no friction rub.   No murmur heard. Pulmonary/Chest: Effort normal. No respiratory distress. She has no wheezes. She has rales (bibasilar).  Abdominal: Soft. She exhibits no distension. There is no tenderness. There is no guarding.  Musculoskeletal: She exhibits  edema (3+ bilaterally). She exhibits no tenderness.  Neurological: She is alert and oriented to person, place, and time.  Skin: Skin is warm and dry. No rash noted. She is not diaphoretic. No erythema.  Nursing note and vitals reviewed.    ED Treatments / Results  Labs (all labs ordered are listed, but only abnormal results are displayed) Labs Reviewed  CBC WITH DIFFERENTIAL/PLATELET - Abnormal; Notable for the following:       Result Value   WBC 12.2 (*)    Hemoglobin 11.3 (*)    Neutro Abs 9.2 (*)    All other components within normal limits  COMPREHENSIVE METABOLIC PANEL - Abnormal; Notable for the following:    Potassium 5.8 (*)    Chloride 100 (*)    BUN 25 (*)    Creatinine, Ser 1.27 (*)    AST 51 (*)    Total Bilirubin 1.4 (*)    GFR calc non Af Amer 42 (*)    GFR calc Af Amer 49 (*)    All other components within normal limits  BRAIN NATRIURETIC PEPTIDE - Abnormal; Notable for the following:    B Natriuretic Peptide 878.7 (*)    All other components within normal limits  GLUCOSE, CAPILLARY - Abnormal; Notable for the following:    Glucose-Capillary 153 (*)    All other components within normal limits  CBG MONITORING, ED - Abnormal; Notable for the following:    Glucose-Capillary 113 (*)    All other components within normal limits  BASIC METABOLIC PANEL  I-STAT TROPOININ, ED  I-STAT TROPOININ, ED  I-STAT TROPOININ, ED    EKG  EKG Interpretation  Date/Time:  Monday July 23 2016 17:49:43 EST Ventricular Rate:  127 PR Interval:    QRS Duration: 163 QT Interval:  346 QTC Calculation: 503 R Axis:   85 Text Interpretation:  Atrial flutter with varied AV block, Ventricular premature complex Right bundle branch block No significant change since last tracing Confirmed by [facility] MD, Rodrick Payson ([id number]) on 07/23/2016 6:03:15 PM       Radiology Dg Chest Portable 1 View  Result Date: 07/23/2016 CLINICAL DATA:  Shortness of breath, tachycardia EXAM: PORTABLE CHEST  1 VIEW COMPARISON:  CT chest 07/14/2014 FINDINGS: There is bilateral mild interstitial thickening. There is no focal parenchymal opacity. There is no pleural effusion or pneumothorax. There is stable cardiomegaly. The osseous structures are unremarkable. IMPRESSION: Cardiomegaly  with mild pulmonary vascular congestion. Electronically Signed   By: Elige Ko   On: 07/23/2016 18:45    Procedures Procedures (including critical care time)  Medications Ordered in ED Medications  diltiazem (CARDIZEM) 1 mg/mL load via infusion 15 mg (15 mg Intravenous Bolus from Bag 07/23/16 1843)    And  diltiazem (CARDIZEM) 100 mg in dextrose 5 % 100 mL (1 mg/mL) infusion (7.5 mg/hr Intravenous Rate/Dose Change 07/23/16 1939)  aspirin EC tablet 81 mg (not administered)  nitroGLYCERIN (NITROSTAT) SL tablet 0.4 mg (not administered)  acetaminophen (TYLENOL) tablet 650 mg (not administered)  ondansetron (ZOFRAN) injection 4 mg (not administered)  0.9 %  sodium chloride infusion (not administered)  insulin aspart (novoLOG) injection 0-15 Units (not administered)  insulin aspart (novoLOG) injection 0-5 Units (0 Units Subcutaneous Not Given 07/23/16 2215)  furosemide (LASIX) injection 40 mg (not administered)   CRITICAL CARE:atrial flutter with RVR Performed by: Rhae Lerner   Total critical care time: 30 minutes  Critical care time was exclusive of separately billable procedures and treating other patients.  Critical care was necessary to treat or prevent imminent or life-threatening deterioration.  Critical care was time spent personally by me on the following activities: development of treatment plan with patient and/or surrogate as well as nursing, discussions with consultants, evaluation of patient's response to treatment, examination of patient, obtaining history from patient or surrogate, ordering and performing treatments and interventions, ordering and review of laboratory studies, ordering  and review of radiographic studies, pulse oximetry and re-evaluation of patient's condition.   Initial Impression / Assessment and Plan / ED Course  I have reviewed the triage vital signs and the nursing notes.  Pertinent labs & imaging results that were available during my care of the patient were reviewed by me and considered in my medical decision making (see chart for details).     71 year old female with a history of paroxysmal atrial fibrillation, morbid obesity, COPD on 4 L of oxygen at home, hypertension, hyperlipidemia, prior pericardial effusion recurrent pericardiocentesis, diabetes, recent admission for atrial flutter with a rapid ventricular rate status post TEE/cardioversion on Xarelto and metoprolol, who presents with concern for shortness of breath, palpitations, and chest pain. Patient had a CT angiogram PE study done last week when she presented with the same symptoms which was negative. Have low suspicion for pulmonary embolus. Suspect patient again has evidence of atrial flutter with a rapid ventricular rate, as well as signs of congestive heart failure.  Given tachycardia, patient was given Cardizem bolus and drip. She was given aspirin by EMS. Cardiology was consulted for admission, given recent admission as well as the difficulty controlling her rate and rhythm previously.    Final Clinical Impressions(s) / ED Diagnoses   Final diagnoses:  Atrial flutter with rapid ventricular response (HCC)  Acute systolic congestive heart failure [facility])    New Prescriptions Current Discharge Medication List       Alvira Monday, MD 07/24/16 [phone]

## 2016-07-23 NOTE — ED Notes (Signed)
XR at bedside

## 2016-07-23 NOTE — H&P (Addendum)
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   The pt was just admitted 07/14/16 with AF and flutter. She was seen in consult by the EP service and underwent TEE CV 07/16/16 to NSR. At TEE she was noted to have an EF of 35-40%, felt to be secondary to her arrtyhmia. Her Diltiazem was stopped after her TEE. She was discharged 07/17/16 in NSR (on Xarelto and Lopressor). She says she did well till yesterday when she noted a jump in her HR to 140. She then noted palpitations. She came to the ED this PM with SOB and was noted to be in AF with RVR. Her rate is improved now on IV Diltiazem and she feels much improved.     Problem List: Past Medical History:  Diagnosis Date  . Anemia    Noted 12/2011  . Atrial fib/flutter, transient    In setting of pericardial effusion 12/2011  . Chest pain    a. 12/2011 Neg Lexi MV, EF 56%.    . Diastolic CHF (HCC)   . GERD (gastroesophageal reflux disease)   . Hyperlipidemia   . Hypertension   . Hypothyroidism   . Obesity   . OSA (obstructive sleep apnea) 11/05/2013   noncompliant with CPAP  . Pericardial effusion    a. Large, s/p pericardiocentesis 01/02/12 yielding fluid (neg malignant cells, only inflammatory cells)  . Pneumonia 09/01/2013   "for the first time" (09/01/2013)  . Tobacco dependence    a. 46 pack yr hx.  . Type 2 diabetes, diet controlled (HCC)   . Venous stasis of lower extremity    a. chronic LEE    Past Surgical History:  Procedure Laterality Date  . APPENDECTOMY  ~ 2008  . BREAST BIOPSY Right 1980's   a. Benign     . CARDIOVERSION N/A 07/16/2016   Procedure: CARDIOVERSION;  Surgeon: Thurmon Fair, MD;  Location: [facility] ENDOSCOPY;  Service: Cardiovascular;  Laterality: N/A;  . CHOLECYSTECTOMY  1980's  . PERICARDIAL TAP N/A 01/02/2012   Procedure: PERICARDIAL TAP;  Surgeon: Kathleene Hazel, MD;  Location: [facility] CATH LAB;  Service: Cardiovascular;  Laterality: N/A;  . PERICARDIOCENTESIS  12/2011   yielding fluid (neg malignant cells, only inflammatory cells)/notes 12/30/2011  . TEE WITHOUT CARDIOVERSION N/A 07/16/2016   Procedure: TRANSESOPHAGEAL ECHOCARDIOGRAM (TEE);  Surgeon: Thurmon Fair, MD;  Location: [facility] ENDOSCOPY;  Service: Cardiovascular;  Laterality: N/A;     Allergies:  Allergies  Allergen Reactions  . Augmentin [Amoxicillin-Pot Clavulanate] Other (See Comments)    Abdominal pain     Home Medications Prior to Admission medications   Medication Sig Start Date End Date Taking? Authorizing Provider  Albuterol Sulfate (PROAIR RESPICLICK) 108 (90 BASE) MCG/ACT AEPB Inhale 2 puffs into the lungs every 6 (six) hours as needed. Patient not taking: Reported on 05/14/2016 03/12/14   Coralyn Helling, MD  aspirin EC 81 MG EC tablet Take 1 tablet (81 mg total) by mouth daily. 09/05/13   Vassie Loll, MD  atorvastatin (LIPITOR) 40 MG tablet TAKE 1 TABLET(40 MG) BY MOUTH DAILY 07/23/16   Joselyn Arrow, MD  benazepril (LOTENSIN) 20 MG tablet Take 1 tablet (20 mg total) by mouth daily. 07/13/16   Vesta Mixer, MD  Cholecalciferol (VITAMIN D) 1000 UNITS capsule Take 1,000 Units by mouth daily.     Historical Provider, MD  FLUoxetine (PROZAC) 20 MG capsule Take 1 capsule (20 mg total) by mouth daily. 05/14/16   Joselyn Arrow, MD  furosemide (LASIX) 40 MG tablet TAKE 1 TABLET BY MOUTH TWICE DAILY 05/31/15   Vesta Mixer, MD  levothyroxine (SYNTHROID, LEVOTHROID) 50 MCG tablet TAKE 1 TABLET(50 MCG) BY MOUTH DAILY 07/12/16   Joselyn Arrow, MD  metoprolol tartrate (LOPRESSOR) 25 MG tablet Take 1 tablet (25 mg total) by  mouth 2 (two) times daily. 07/13/16   Vesta Mixer, MD  Multiple Vitamins-Minerals (ONE-A-DAY WOMENS 50+ ADVANTAGE) TABS Take 1 tablet by mouth daily after breakfast.    Historical Provider, MD  omeprazole (PRILOSEC) 20 MG capsule Take 1 capsule (20 mg total) by mouth 2 (two) times daily. Patient taking differently: Take 20 mg by mouth daily.  01/21/12   Rosalio Macadamia, NP  potassium chloride (K-DUR,KLOR-CON) 10 MEQ tablet Take 1 tablet (10 mEq total) by mouth daily. 07/13/16   Vesta Mixer, MD  rivaroxaban (XARELTO) 20 MG TABS tablet Take 1 tablet (20 mg total) by mouth daily with supper. 07/17/16   Marily Lente, NP  SYMBICORT 160-4.5 MCG/ACT inhaler INHALE 2 PUFFS INTO THE LUNGS TWICE DAILY 07/13/16   Coralyn Helling, MD     Family History  Problem Relation Age of Onset  . Breast cancer Mother 36  . Hypertension Mother   . Arthritis Mother   . Anxiety disorder Mother   . Colon cancer Mother   . Diabetes Father     died in MVA  . Healthy Sister      Social History   Social History  . Marital status: Married    Spouse name: N/A  . Number of children: 0  . Years of education: N/A   Occupational History  . Retired (Presenter, broadcasting) [employer]   Social History Main Topics  . Smoking status: Former Smoker    Years: 50.00    Types: Cigarettes    Quit date: 12/16/2013  . Smokeless tobacco: Never Used  . Alcohol use No  . Drug use: No  . Sexual activity: No   Other Topics Concern  . Not on file   Social History Narrative   Lives in [place] with husband.  Quit smoking 11/2013 (when she was put on oxygen)   04/2015 husband had CABG, afib     Review of Systems: General: negative for chills, fever, night sweats or weight changes.  Cardiovascular: negative for chest pain, dyspnea on exertion, edema, orthopnea, paroxysmal nocturnal dyspnea HEENT: negative for any visual disturbances, blindness, glaucoma Dermatological: negative for rash Respiratory: negative for cough,  hemoptysis, or wheezing Urologic: negative for hematuria or dysuria Abdominal: negative for nausea, vomiting, diarrhea, bright red blood per rectum, melena, or hematemesis Neurologic: negative for visual changes, syncope, or dizziness Musculoskeletal: negative for back pain, joint pain, or swelling Psych: cooperative and appropriate All other systems reviewed and are otherwise negative except as noted above.  Physical Exam: Blood pressure 111/78, pulse 65, temperature 98.4 F (36.9 C), temperature source Oral, resp. rate 19, height 5\' 8"  (1.727 m), weight (!) 386 lb (175.1 kg), SpO2 95 %.  General appearance: alert, cooperative, no distress and morbidly obese Neck: no carotid bruit and no JVD Lungs: decreased breath sounds Heart:  irregularly irregular rhythm and decreased heart sounds Abdomen: obese Extremities: no obvious edema. she has chronic venous skin changes Pulses: diminnished Skin: pale cool, dry Neurologic: Grossly normal    Labs:   Results for orders placed or performed during the hospital encounter of 07/23/16 (from the past 24 hour(s))  CBC with Differential     Status: Abnormal   Collection Time: 07/23/16  5:45 PM  Result Value Ref Range   WBC 12.2 (H) 4.0 - 10.5 K/uL   RBC 4.04 3.87 - 5.11 MIL/uL   Hemoglobin 11.3 (L) 12.0 - 15.0 g/dL   HCT 16.1 09.6 - 04.5 %   MCV 93.3 78.0 - 100.0 fL   MCH 28.0 26.0 - 34.0 pg   MCHC 30.0 30.0 - 36.0 g/dL   RDW 40.9 81.1 - 91.4 %   Platelets 248 150 - 400 K/uL   Neutrophils Relative % 76 %   Neutro Abs 9.2 (H) 1.7 - 7.7 K/uL   Lymphocytes Relative 16 %   Lymphs Abs 1.9 0.7 - 4.0 K/uL   Monocytes Relative 8 %   Monocytes Absolute 1.0 0.1 - 1.0 K/uL   Eosinophils Relative 0 %   Eosinophils Absolute 0.0 0.0 - 0.7 K/uL   Basophils Relative 0 %   Basophils Absolute 0.1 0.0 - 0.1 K/uL  I-Stat Troponin, ED - 0, 3, 6 hours (not at [facility])     Status: None   Collection Time: 07/23/16  6:45 PM  Result Value Ref Range   Troponin  i, poc 0.02 0.00 - 0.08 ng/mL   Comment 3             Radiology/Studies: Dg Chest 2 View  Result Date: 07/14/2016 CLINICAL DATA:  Pt reports sharp back pain in upper right shoulder blade yesterday that "punched through pt" lasting about 5 minutes. Pt states since then, she has felt shaky and had cold sweats EXAM: CHEST  2 VIEW COMPARISON:  Radiograph 10/31/2015 FINDINGS: Stable enlarged cardiac silhouette. Central venous pulmonary congestion increased from prior. New focus of consolidation. No pleural fluid. IMPRESSION: Cardiomegaly and central venous congestion suggest mild congestive heart failure Electronically Signed   By: Genevive Bi M.D.   On: 07/14/2016 14:17   Ct Angio Chest Pe W Or Wo Contrast  Result Date: 07/14/2016 CLINICAL DATA:  Elevated D-dimer, chest pain. EXAM: CT ANGIOGRAPHY CHEST WITH CONTRAST TECHNIQUE: Multidetector CT imaging of the chest was performed using the standard protocol during bolus administration of intravenous contrast. Multiplanar CT image reconstructions and MIPs were obtained to evaluate the vascular anatomy. CONTRAST:  80 cc Isovue COMPARISON:  Chest radiograph 07/14/2016 FINDINGS: Cardiovascular: Exam is somewhat limited by patient body habitus. No filling defects within the proximal pulmonary arteries. The lower lobe pulmonary arteries are not well evaluated. Mediastinum/Nodes: No axillary supraclavicular adenopathy. No mediastinal adenopathy. Esophagus normal. Lungs/Pleura: No pulmonary infarction. No consolidation. Mild interstitial thickening. Minimal atelectasis. No effusion. Upper Abdomen: Limited view of the liver, kidneys, pancreas are unremarkable. Normal adrenal glands. Musculoskeletal: No aggressive osseous lesion. Review of the MIP images confirms the above findings. IMPRESSION: 1. No evidence acute pulmonary embolism in suboptimal exam due to patient body habitus. 2. Mild interstitial edema. 3. No overt pulmonary edema, infarction or pneumonia.  Electronically Signed   By: Genevive Bi M.D.   On: 07/14/2016 16:38   Dg Chest Portable 1 View  Result Date: 07/23/2016 CLINICAL DATA:  Shortness of breath, tachycardia EXAM: PORTABLE CHEST 1 VIEW COMPARISON:  CT chest 07/14/2014 FINDINGS: There is bilateral  mild interstitial thickening. There is no focal parenchymal opacity. There is no pleural effusion or pneumothorax. There is stable cardiomegaly. The osseous structures are unremarkable. IMPRESSION: Cardiomegaly with mild pulmonary vascular congestion. Electronically Signed   By: Elige Ko   On: 07/23/2016 18:45   Echocardiogram: 1.  TEE/DCCV on 07/16/16 by Dr Royann Shivers. This study demonstrated EF 35-40%, markedly dilated right atrium, no LA thrombus, moderate MR, mild TR. She then underwent successful cardioversion. There were no early apparent complications.  2.  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 Will ask EP to see in the AM but I got the sense that there was not much else they could besides rate control at this point secondary to her weight.   Jolene Provost, PA-C 07/23/2016, 7:12 PM [phone]  The patient was seen, examined and discussed with Corine Shelter, PA-C and I agree with the above.   71 y/o Caucasian female with a history of morbid obesity-BMI  58.9, COPD and hypoventilation- on chronic O2, PAF, HTN, NICM, OSA with C-pap intolerance, and prior pericardial effusion in 2013 (tapped of inflammatory fluid), re admitted to the hospital this PM with recurrent atrial flutter with RVR.   The pt was just admitted 07/14/16 with AF and flutter. She was seen in consult by the EP service and underwent TEE/DCCV 07/16/16 to NSR. At TEE she was noted to have an EF of 35-40%, felt to be secondary to her arrhythmia. Her Diltiazem was stopped after her TEE. She was discharged 07/17/16 in NSR (on Xarelto and Lopressor). She says she did well till yesterday when she noted a jump in her HR to 140. She then noted palpitations. She came to the ED this PM with SOB and was noted to be in AF with RVR. Her rate is improved now on IV Diltiazem and she feels much improved. We will continue iv Cardizem for now, we will discuss with EP in the morning for antiarrhythmic recommendations.  She is in acute on chronic combined systolic and diastolic CHF, I dont think that she will tolerate a-flutter long term. I will start lasix 40 mg ib BID. She is on Xarelto for anticoagulation.  Tobias Alexander, MD 07/23/2016

## 2016-07-24 ENCOUNTER — Ambulatory Visit: Payer: Medicare Other | Admitting: Cardiovascular Disease

## 2016-07-24 DIAGNOSIS — Z6841 Body Mass Index (BMI) 40.0 and over, adult: Secondary | ICD-10-CM | POA: Diagnosis not present

## 2016-07-24 DIAGNOSIS — I4901 Ventricular fibrillation: Secondary | ICD-10-CM | POA: Diagnosis not present

## 2016-07-24 DIAGNOSIS — I4819 Other persistent atrial fibrillation: Secondary | ICD-10-CM | POA: Diagnosis present

## 2016-07-24 DIAGNOSIS — R9431 Abnormal electrocardiogram [ECG] [EKG]: Secondary | ICD-10-CM | POA: Diagnosis not present

## 2016-07-24 DIAGNOSIS — E662 Morbid (severe) obesity with alveolar hypoventilation: Secondary | ICD-10-CM | POA: Diagnosis present

## 2016-07-24 DIAGNOSIS — I499 Cardiac arrhythmia, unspecified: Secondary | ICD-10-CM | POA: Diagnosis not present

## 2016-07-24 DIAGNOSIS — I493 Ventricular premature depolarization: Secondary | ICD-10-CM | POA: Diagnosis present

## 2016-07-24 DIAGNOSIS — T884XXA Failed or difficult intubation, initial encounter: Secondary | ICD-10-CM | POA: Diagnosis not present

## 2016-07-24 DIAGNOSIS — G9340 Encephalopathy, unspecified: Secondary | ICD-10-CM | POA: Diagnosis not present

## 2016-07-24 DIAGNOSIS — K219 Gastro-esophageal reflux disease without esophagitis: Secondary | ICD-10-CM | POA: Diagnosis present

## 2016-07-24 DIAGNOSIS — I5033 Acute on chronic diastolic (congestive) heart failure: Secondary | ICD-10-CM | POA: Diagnosis present

## 2016-07-24 DIAGNOSIS — R402112 Coma scale, eyes open, never, at arrival to emergency department: Secondary | ICD-10-CM | POA: Diagnosis not present

## 2016-07-24 DIAGNOSIS — G4733 Obstructive sleep apnea (adult) (pediatric): Secondary | ICD-10-CM | POA: Diagnosis present

## 2016-07-24 DIAGNOSIS — R570 Cardiogenic shock: Secondary | ICD-10-CM | POA: Diagnosis not present

## 2016-07-24 DIAGNOSIS — I4891 Unspecified atrial fibrillation: Secondary | ICD-10-CM

## 2016-07-24 DIAGNOSIS — E785 Hyperlipidemia, unspecified: Secondary | ICD-10-CM | POA: Diagnosis present

## 2016-07-24 DIAGNOSIS — Z87891 Personal history of nicotine dependence: Secondary | ICD-10-CM | POA: Diagnosis not present

## 2016-07-24 DIAGNOSIS — J690 Pneumonitis due to inhalation of food and vomit: Secondary | ICD-10-CM | POA: Diagnosis not present

## 2016-07-24 DIAGNOSIS — R748 Abnormal levels of other serum enzymes: Secondary | ICD-10-CM | POA: Diagnosis not present

## 2016-07-24 DIAGNOSIS — I5032 Chronic diastolic (congestive) heart failure: Secondary | ICD-10-CM

## 2016-07-24 DIAGNOSIS — I110 Hypertensive heart disease with heart failure: Secondary | ICD-10-CM | POA: Diagnosis present

## 2016-07-24 DIAGNOSIS — I469 Cardiac arrest, cause unspecified: Secondary | ICD-10-CM | POA: Diagnosis not present

## 2016-07-24 DIAGNOSIS — I481 Persistent atrial fibrillation: Secondary | ICD-10-CM | POA: Diagnosis not present

## 2016-07-24 DIAGNOSIS — Z79899 Other long term (current) drug therapy: Secondary | ICD-10-CM | POA: Diagnosis not present

## 2016-07-24 DIAGNOSIS — Z7951 Long term (current) use of inhaled steroids: Secondary | ICD-10-CM | POA: Diagnosis not present

## 2016-07-24 DIAGNOSIS — I959 Hypotension, unspecified: Secondary | ICD-10-CM | POA: Diagnosis not present

## 2016-07-24 DIAGNOSIS — I4892 Unspecified atrial flutter: Secondary | ICD-10-CM | POA: Diagnosis not present

## 2016-07-24 DIAGNOSIS — E039 Hypothyroidism, unspecified: Secondary | ICD-10-CM | POA: Diagnosis present

## 2016-07-24 DIAGNOSIS — Z4682 Encounter for fitting and adjustment of non-vascular catheter: Secondary | ICD-10-CM | POA: Diagnosis not present

## 2016-07-24 DIAGNOSIS — I878 Other specified disorders of veins: Secondary | ICD-10-CM | POA: Diagnosis present

## 2016-07-24 DIAGNOSIS — Z8249 Family history of ischemic heart disease and other diseases of the circulatory system: Secondary | ICD-10-CM | POA: Diagnosis not present

## 2016-07-24 DIAGNOSIS — Z9981 Dependence on supplemental oxygen: Secondary | ICD-10-CM | POA: Diagnosis not present

## 2016-07-24 DIAGNOSIS — I4510 Unspecified right bundle-branch block: Secondary | ICD-10-CM | POA: Diagnosis present

## 2016-07-24 DIAGNOSIS — R402212 Coma scale, best verbal response, none, at arrival to emergency department: Secondary | ICD-10-CM | POA: Diagnosis not present

## 2016-07-24 DIAGNOSIS — Z9049 Acquired absence of other specified parts of digestive tract: Secondary | ICD-10-CM | POA: Diagnosis not present

## 2016-07-24 DIAGNOSIS — I4439 Other atrioventricular block: Secondary | ICD-10-CM | POA: Diagnosis present

## 2016-07-24 DIAGNOSIS — J449 Chronic obstructive pulmonary disease, unspecified: Secondary | ICD-10-CM | POA: Diagnosis present

## 2016-07-24 DIAGNOSIS — E119 Type 2 diabetes mellitus without complications: Secondary | ICD-10-CM | POA: Diagnosis present

## 2016-07-24 DIAGNOSIS — I249 Acute ischemic heart disease, unspecified: Secondary | ICD-10-CM | POA: Diagnosis not present

## 2016-07-24 DIAGNOSIS — J9690 Respiratory failure, unspecified, unspecified whether with hypoxia or hypercapnia: Secondary | ICD-10-CM | POA: Diagnosis not present

## 2016-07-24 DIAGNOSIS — I081 Rheumatic disorders of both mitral and tricuspid valves: Secondary | ICD-10-CM | POA: Diagnosis present

## 2016-07-24 DIAGNOSIS — S0003XA Contusion of scalp, initial encounter: Secondary | ICD-10-CM | POA: Diagnosis not present

## 2016-07-24 DIAGNOSIS — R402312 Coma scale, best motor response, none, at arrival to emergency department: Secondary | ICD-10-CM | POA: Diagnosis not present

## 2016-07-24 DIAGNOSIS — J9601 Acute respiratory failure with hypoxia: Secondary | ICD-10-CM | POA: Diagnosis not present

## 2016-07-24 DIAGNOSIS — Z7901 Long term (current) use of anticoagulants: Secondary | ICD-10-CM | POA: Diagnosis not present

## 2016-07-24 DIAGNOSIS — I429 Cardiomyopathy, unspecified: Secondary | ICD-10-CM | POA: Diagnosis present

## 2016-07-24 DIAGNOSIS — R0602 Shortness of breath: Secondary | ICD-10-CM | POA: Diagnosis not present

## 2016-07-24 DIAGNOSIS — E559 Vitamin D deficiency, unspecified: Secondary | ICD-10-CM | POA: Diagnosis present

## 2016-07-24 DIAGNOSIS — I480 Paroxysmal atrial fibrillation: Secondary | ICD-10-CM | POA: Diagnosis not present

## 2016-07-24 LAB — GLUCOSE, CAPILLARY
GLUCOSE-CAPILLARY: 114 mg/dL — AB (ref 65–99)
GLUCOSE-CAPILLARY: 101 mg/dL — AB (ref 65–99)
Glucose-Capillary: 110 mg/dL — ABNORMAL HIGH (ref 65–99)

## 2016-07-24 LAB — BASIC METABOLIC PANEL
Anion gap: 11 (ref 5–15)
BUN: 26 mg/dL — ABNORMAL HIGH (ref 6–20)
CO2: 33 mmol/L — ABNORMAL HIGH (ref 22–32)
Calcium: 9 mg/dL (ref 8.9–10.3)
Chloride: 100 mmol/L — ABNORMAL LOW (ref 101–111)
Creatinine, Ser: 1.45 mg/dL — ABNORMAL HIGH (ref 0.44–1.00)
GFR calc Af Amer: 42 mL/min — ABNORMAL LOW (ref >60)
GFR calc non Af Amer: 36 mL/min — ABNORMAL LOW (ref >60)
Glucose, Bld: 126 mg/dL — ABNORMAL HIGH (ref 65–99)
Potassium: 4.1 mmol/L (ref 3.5–5.1)
Sodium: 144 mmol/L (ref 135–145)

## 2016-07-24 LAB — MAGNESIUM
Magnesium: 1.6 mg/dL — ABNORMAL LOW (ref 1.7–2.4)
Magnesium: 2.6 mg/dL — ABNORMAL HIGH (ref 1.7–2.4)

## 2016-07-24 MED ORDER — SODIUM CHLORIDE 0.9% FLUSH
3.0000 mL | Freq: Two times a day (BID) | INTRAVENOUS | Status: DC
  Administered 2016-07-24 – 2016-07-26 (×5): 3 mL via INTRAVENOUS

## 2016-07-24 MED ORDER — DILTIAZEM HCL ER COATED BEADS 240 MG PO CP24
240.0000 mg | ORAL_CAPSULE | Freq: Every day | ORAL | Status: DC
  Administered 2016-07-24: 240 mg via ORAL
  Filled 2016-07-24: qty 1

## 2016-07-24 MED ORDER — SODIUM CHLORIDE 0.9% FLUSH: 3.0000 mL | INTRAVENOUS | Status: DC | PRN

## 2016-07-24 MED ORDER — DOFETILIDE 250 MCG PO CAPS
500.0000 ug | ORAL_CAPSULE | Freq: Two times a day (BID) | ORAL | Status: DC
  Administered 2016-07-25 – 2016-07-27 (×5): 500 ug via ORAL
  Filled 2016-07-24 (×5): qty 2

## 2016-07-24 MED ORDER — SODIUM CHLORIDE 0.9 % IV SOLN: 250.0000 mL | INTRAVENOUS | Status: DC | PRN

## 2016-07-24 MED ORDER — RIVAROXABAN 20 MG PO TABS
20.0000 mg | ORAL_TABLET | Freq: Every day | ORAL | Status: DC
  Administered 2016-07-24 – 2016-07-27 (×4): 20 mg via ORAL
  Filled 2016-07-24 (×4): qty 1

## 2016-07-24 MED ORDER — FUROSEMIDE 40 MG PO TABS
40.0000 mg | ORAL_TABLET | Freq: Every day | ORAL | Status: DC
  Administered 2016-07-25 – 2016-07-27 (×3): 40 mg via ORAL
  Filled 2016-07-24 (×3): qty 1

## 2016-07-24 MED ORDER — DILTIAZEM HCL-DEXTROSE 100-5 MG/100ML-% IV SOLN (PREMIX)
5.0000 mg/h | INTRAVENOUS | Status: DC
  Administered 2016-07-24: 7.5 mg/h via INTRAVENOUS
  Filled 2016-07-24: qty 100

## 2016-07-24 MED ORDER — MAGNESIUM SULFATE 4 GM/100ML IV SOLN
4.0000 g | Freq: Once | INTRAVENOUS | Status: AC
  Administered 2016-07-24 (×2): 4 g via INTRAVENOUS
  Filled 2016-07-24: qty 100

## 2016-07-24 NOTE — Progress Notes (Signed)
Spoke with Dr. Alfonzo Feller who stated that the Qtc looks a little longer and to hold the Tikosyn until morning time.

## 2016-07-24 NOTE — Progress Notes (Signed)
Pharmacy Review for Dofetilide (Tikosyn) Initiation  Admit Complaint: 71 y.o. female admitted 07/23/2016 with atrial fibrillation to be initiated on dofetilide.   Assessment:  Patient Exclusion Criteria: If any screening criteria checked as "Yes", then  patient  should NOT receive dofetilide until criteria item is corrected. If "Yes" please indicate correction plan.  YES  NO Patient  Exclusion Criteria Correction Plan  [x]  []  Baseline QTc interval is greater than or equal to 440 msec. IF above YES box checked dofetilide contraindicated unless patient has ICD; then may proceed if QTc 500-550 msec or with known ventricular conduction abnormalities may proceed with QTc 550-600 msec. QTc = 503 Hear rate was 127. Spoke to [employer] QTc at sinus rhythm was acceptablle  [x]  []  Magnesium level is less than 1.8 mEq/l : Last magnesium:  Lab Results  Component Value Date   MG 1.6 (L) 07/24/2016       Magnesium and being replacing and rechecked   []  [x]  Potassium level is less than 4 mEq/l : Last potassium:  Lab Results  Component Value Date   K 4.1 07/24/2016         []  [x]  Patient is known or suspected to have a digoxin level greater than 2 ng/ml: No results found for: DIGOXIN    []  [x]  Creatinine clearance less than 20 ml/min (calculated using Cockcroft-Gault, actual body weight and serum creatinine): Estimated Creatinine Clearance: 63.4 mL/min (by C-G formula based on SCr of 1.45 mg/dL (H)).    []  [x]  Patient has received drugs known to prolong the QT intervals within the last 48 hours (phenothiazines, tricyclics or tetracyclic antidepressants, erythromycin, H-1 antihistamines, cisapride, fluoroquinolones, azithromycin). Drugs not listed above may have an, as yet, undetected potential to prolong the QT interval, updated information on QT prolonging agents is available at this website:QT prolonging agents   []  [x]  Patient received a dose of hydrochlorothiazide (Oretic) alone or in any  combination including triamterene (Dyazide, Maxzide) in the last 48 hours.   []  [x]  Patient received a medication known to increase dofetilide plasma concentrations prior to initial dofetilide dose:  . Trimethoprim (Primsol, Proloprim) in the last 36 hours . Verapamil (Calan, Verelan) in the last 36 hours or a sustained release dose in the last 72 hours . Megestrol (Megace) in the last 5 days  . Cimetidine (Tagamet) in the last 6 hours . Ketoconazole (Nizoral) in the last 24 hours . Itraconazole (Sporanox) in the last 48 hours  . Prochlorperazine (Compazine) in the last 36 hours    []  [x]  Patient is known to have a history of torsades de pointes; congenital or acquired long QT syndromes.   []  [x]  Patient has received a Class 1 antiarrhythmic with less than 2 half-lives since last dose. (Disopyramide, Quinidine, Procainamide, Lidocaine, Mexiletine, Flecainide, Propafenone)   []  [x]  Patient has received amiodarone therapy in the past 3 months or amiodarone level is greater than 0.3 ng/ml.    Patient has been appropriately anticoagulated with Xarelto.  Ordering provider was confirmed at [email or web address] if they are not listed on the [facility] Authorized Prescribers list.  Goal of Therapy: Follow renal function, electrolytes, potential drug interactions, and dose adjustment. Provide education and 1 week supply at discharge.  Plan:  [x]   Physician selected initial dose within range recommended for patients level of renal function - will monitor for response.  []   Physician selected initial dose outside of range recommended for patients level of renal function - will discuss if the dose should be altered  at this time.   Select One Calculated CrCl  Dose q12h  [x]  > 60 ml/min 500 mcg  []  40-60 ml/min 250 mcg  []  20-40 ml/min 125 mcg   2. Follow up QTc after the first 5 doses, renal function, electrolytes (K & Mg) daily x 3     days, dose adjustment, success of initiation and facilitate 1 week  discharge supply as     clinically indicated.  3. Initiate Tikosyn education video (Call [phone] and ask for Tikosyn Video # 116).  4. Place Enrollment Form on the chart for discharge supply of dofetilide.   Harland German, Pharm D 07/24/2016 4:06 PM

## 2016-07-24 NOTE — Progress Notes (Signed)
Insurance check completed for [employer] S/W DJ @ [employer] # [phone]   TIKOSYN 500 MCG BID  COVER-  NONE FORMULARY   DOFETILIDE  500 MCG BID   COVER- YES  CO-PAY- $ 144.18  TIER- 4 DRUG  PRIOR APPROVAL- NO  PHARMACY : PREFERRED - [facility]

## 2016-07-24 NOTE — Care Management Note (Signed)
Case Management Note Donn Pierini RN, BSN Unit 2W-Case Manager [phone]  Patient Details  Name: Nancy West MRN: [id number] Date of Birth: 1945/11/13  Subjective/Objective:  Pt admitted with afib for Tikosyn load                  Action/Plan: PTA pt lived at home, has home 02-4L baseline with Adult and Pediatric Specialist. - Referral received for Tikosyn needs- per insurance check- S/W DJ @ [employer] # [phone]   TIKOSYN 500 MCG BID  COVER-  NONE FORMULARY   DOFETILIDE  500 MCG BID   COVER- YES  CO-PAY- $ 144.18  TIER- 4 DRUG  PRIOR APPROVAL- NO  PHARMACY : PREFERRED - [facility]   Spoke with pt at bedside- coverage info shared- pt will need 7 day supply with no refills on discharge from [employer] [facility]- per pt she uses [facility] on [address]- will need script sent to pharmacy for them to order drug in stock.   Expected Discharge Date:                  Expected Discharge Plan:  Home/Self Care  In-House Referral:     Discharge planning Services  CM Consult, Medication Assistance  Post Acute Care Choice:    Choice offered to:     DME Arranged:    DME Agency:     HH Arranged:    HH Agency:     Status of Service:  Completed, signed off  If discussed at [employer] of Stay Meetings, dates discussed:    Additional Comments:  Darrold Span, RN 07/24/2016, 4:13 PM

## 2016-07-24 NOTE — Consult Note (Signed)
ELECTROPHYSIOLOGY CONSULT NOTE    Patient ID: Nancy West MRN: [id number], DOB/AGE: [birth year] 71 y.o.  Admit date: 07/23/2016 Date of Consult: 07/24/2016  Primary Physician: Lavonda Jumbo, MD Primary Cardiologist: Elease Hashimoto Requesting MD: Delton See  Reason for Consultation: recurrent atrial flutter  HPI:  Nancy West is a 71 y.o. female with a past medical history significant for super morbid obesity, chronic diastolic heart failure, OSA not compliant with CPAP and atrial fibrillation/flutter. She was recently admitted for atrial flutter and was evaluated by EP at that time. She was not felt to be a candidate for ablation by Dr Ladona Ridgel 2/2 her size and concerns about safety of procedure. She was therefore TEE cardioverted and discharged to home. She was symptomatically improved in SR and did well for several days until yesterday when she again developed recurrent tachypalpitations and shortness of breath. She came to the ER and was found to be in atrial flutter with RVR. She has been placed on Diltiazem drip with improvement in rate control and symptoms. EP has been asked to evaluate for treatment options.  Echo 07/16/16 demonstrated EF 45-50%, grade 2 diastolic dysfunction, PA pressure 52, mild pericardial effusion, LA 51.  She currently denies shortness of breath at rest, chest pain, recent fevers, chills, nausea or vomiting. She has not had dizziness, pre-syncope or syncope.   Past Medical History:  Diagnosis Date  . Anemia    Noted 12/2011  . Atrial fib/flutter, transient    In setting of pericardial effusion 12/2011  . Chest pain    a. 12/2011 Neg Lexi MV, EF 56%.    . Diastolic CHF (HCC)   . GERD (gastroesophageal reflux disease)   . Hyperlipidemia   . Hypertension   . Hypothyroidism   . Obesity   . OSA (obstructive sleep apnea) 11/05/2013   noncompliant with CPAP  . Pericardial effusion    a. Large, s/p pericardiocentesis 01/02/12 yielding fluid (neg malignant cells,  only inflammatory cells)  . Pneumonia 09/01/2013   "for the first time" (09/01/2013)  . Tobacco dependence    a. 46 pack yr hx.  . Type 2 diabetes, diet controlled (HCC)   . Venous stasis of lower extremity    a. chronic LEE     Surgical History:  Past Surgical History:  Procedure Laterality Date  . APPENDECTOMY  ~ 2008  . BREAST BIOPSY Right 1980's   a. Benign   . CARDIOVERSION N/A 07/16/2016   Procedure: CARDIOVERSION;  Surgeon: Thurmon Fair, MD;  Location: [facility] ENDOSCOPY;  Service: Cardiovascular;  Laterality: N/A;  . CHOLECYSTECTOMY  1980's  . PERICARDIAL TAP N/A 01/02/2012   Procedure: PERICARDIAL TAP;  Surgeon: Kathleene Hazel, MD;  Location: [facility] CATH LAB;  Service: Cardiovascular;  Laterality: N/A;  . PERICARDIOCENTESIS  12/2011   yielding fluid (neg malignant cells, only inflammatory cells)/notes 12/30/2011  . TEE WITHOUT CARDIOVERSION N/A 07/16/2016   Procedure: TRANSESOPHAGEAL ECHOCARDIOGRAM (TEE);  Surgeon: Thurmon Fair, MD;  Location: [facility] ENDOSCOPY;  Service: Cardiovascular;  Laterality: N/A;     Prescriptions Prior to Admission  Medication Sig Dispense Refill Last Dose  . acetaminophen (TYLENOL) 650 MG CR tablet Take 650 mg by mouth every 8 (eight) hours as needed (for back pain).   07/22/2016 at pm  . aspirin EC 81 MG EC tablet Take 1 tablet (81 mg total) by mouth daily.   07/23/2016 at 1600  . atorvastatin (LIPITOR) 40 MG tablet TAKE 1 TABLET(40 MG) BY MOUTH DAILY 90 tablet 0 07/23/2016 at 1600  .  benazepril (LOTENSIN) 20 MG tablet Take 1 tablet (20 mg total) by mouth daily. 30 tablet 0 07/23/2016 at 1600  . Cholecalciferol (VITAMIN D) 1000 UNITS capsule Take 1,000 Units by mouth daily.    07/23/2016 at Unknown time  . FLUoxetine (PROZAC) 20 MG capsule Take 1 capsule (20 mg total) by mouth daily. 90 capsule 1 07/23/2016 at 1600  . furosemide (LASIX) 40 MG tablet TAKE 1 TABLET BY MOUTH TWICE DAILY 180 tablet 3 07/23/2016 at 1600  . levothyroxine (SYNTHROID, LEVOTHROID)  50 MCG tablet TAKE 1 TABLET(50 MCG) BY MOUTH DAILY 90 tablet 0 07/23/2016 at 0700  . metoprolol tartrate (LOPRESSOR) 25 MG tablet Take 1 tablet (25 mg total) by mouth 2 (two) times daily. 60 tablet 0 07/23/2016 at 1600  . Multiple Vitamins-Minerals (ONE-A-DAY WOMENS 50+ ADVANTAGE) TABS Take 1 tablet by mouth daily after breakfast.   07/23/2016 at Unknown time  . mupirocin cream (BACTROBAN) 2 % Apply 1 application topically 2 (two) times daily. To affected areas near tailbone   Past Week at Unknown time  . omeprazole (PRILOSEC) 20 MG capsule Take 1 capsule (20 mg total) by mouth 2 (two) times daily.   07/23/2016 at am  . potassium chloride (K-DUR,KLOR-CON) 10 MEQ tablet Take 1 tablet (10 mEq total) by mouth daily. 90 tablet 0 07/23/2016 at 1600  . rivaroxaban (XARELTO) 20 MG TABS tablet Take 1 tablet (20 mg total) by mouth daily with supper. 30 tablet 1 07/22/2016 at 2000  . SYMBICORT 160-4.5 MCG/ACT inhaler INHALE 2 PUFFS INTO THE LUNGS TWICE DAILY 10.2 g 2 07/23/2016 at am  . Albuterol Sulfate (PROAIR RESPICLICK) 108 (90 BASE) MCG/ACT AEPB Inhale 2 puffs into the lungs every 6 (six) hours as needed. (Patient not taking: Reported on 07/23/2016) 1 each 5 Not Taking at Unknown time    Inpatient Medications:  . aspirin EC  81 mg Oral Daily  . furosemide  40 mg Intravenous BID  . insulin aspart  0-15 Units Subcutaneous TID WC  . insulin aspart  0-5 Units Subcutaneous QHS  . rivaroxaban  20 mg Oral Daily    Allergies:  Allergies  Allergen Reactions  . Augmentin [Amoxicillin-Pot Clavulanate] Other (See Comments)    Abdominal pain    Social History   Social History  . Marital status: Married    Spouse name: N/A  . Number of children: 0  . Years of education: N/A   Occupational History  . Retired (Presenter, broadcasting) [employer]   Social History Main Topics  . Smoking status: Former Smoker    Years: 50.00    Types: Cigarettes    Quit date: 12/16/2013  . Smokeless tobacco: Never Used  . Alcohol  use No  . Drug use: No  . Sexual activity: No   Other Topics Concern  . Not on file   Social History Narrative   Lives in [place] with husband.  Quit smoking 11/2013 (when she was put on oxygen)   04/2015 husband had CABG, afib     Family History  Problem Relation Age of Onset  . Breast cancer Mother 38  . Hypertension Mother   . Arthritis Mother   . Anxiety disorder Mother   . Colon cancer Mother   . Diabetes Father     died in MVA  . Healthy Sister      Review of Systems: All other systems reviewed and are otherwise negative except as noted above.  Physical Exam: Vitals:   07/23/16 2230 07/23/16 2245 07/23/16 2349  07/24/16 0520  BP: 148/64 146/74 127/64 (!) 155/72  Pulse: (!) 56 (!) 47 (!) 101 (!) 35  Resp: 19 20 20 20   Temp:   98.2 F (36.8 C) 97.8 F (36.6 C)  TempSrc:   Oral Oral  SpO2: 100% 96% 95% 94%  Weight:   (!) 397 lb 1.6 oz (180.1 kg) (!) 384 lb 8 oz (174.4 kg)  Height:   5\' 8"  (1.727 m)     GEN- The patient is morbidly obese appearing, alert and oriented x 3 today.   HEENT: normocephalic, atraumatic; sclera clear, conjunctiva pink; hearing intact; oropharynx clear; neck supple  Lungs- Clear to ausculation bilaterally, normal work of breathing.  No wheezes, rales, rhonchi Heart- Regular rate and rhythm  GI- soft, non-tender, non-distended, bowel sounds present Extremities- no clubbing, cyanosis, or edema; chronic venous stasis changes MS- no significant deformity or atrophy Skin- warm and dry, no rash or lesion Psych- euthymic mood, full affect Neuro- strength and sensation are intact  Labs:   Lab Results  Component Value Date   WBC 12.2 (H) 07/23/2016   HGB 11.3 (L) 07/23/2016   HCT 37.7 07/23/2016   MCV 93.3 07/23/2016   PLT 248 07/23/2016     Recent Labs Lab 07/23/16 1745 07/24/16 0356  NA 141 144  K 5.8* 4.1  CL 100* 100*  CO2 28 33*  BUN 25* 26*  CREATININE 1.27* 1.45*  CALCIUM 9.0 9.0  PROT 6.9  --   BILITOT 1.4*  --     ALKPHOS 69  --   ALT 24  --   AST 51*  --   GLUCOSE 98 126*      Radiology/Studies:  Dg Chest Portable 1 View Result Date: 07/23/2016 CLINICAL DATA:  Shortness of breath, tachycardia EXAM: PORTABLE CHEST 1 VIEW COMPARISON:  CT chest 07/14/2014 FINDINGS: There is bilateral mild interstitial thickening. There is no focal parenchymal opacity. There is no pleural effusion or pneumothorax. There is stable cardiomegaly. The osseous structures are unremarkable. IMPRESSION: Cardiomegaly with mild pulmonary vascular congestion. Electronically Signed   By: Elige Ko   On: 07/23/2016 18:45    [id number] atrial flutter, ventricular rate 88  TELEMETRY: rate controlled atrial flutter   Assessment/Plan: 1.  Recurrent persistent atrial fibrillation/flutter The patient has both atrial fibrillation and atrial flutter documented. She states that she currently feels as well now in rate controlled atrial flutter as she did in SR post cardioversion. I think that trying to maintain SR is reasonable if we can with medications. With her weight and co-morbidities, she is not a candidate for EP procedures. She has underlying RBBB which limits AAD options some. With her LA size, I think Tikosyn is probably our best option at this point to restore SR. Will discuss with Dr Johney Frame and plan to start later today if he agrees. She reports compliance with Xarelto without missed doses since last cardioversion.   2.  Morbid obesity Body mass index is 58.46 kg/m. Refer to bariatric surgery at discharge  3.  OSA Compliance with CPAP encouraged  4.  HTN Stable No change required today  5.  Acute on chronic diastolic heart failure Lasix started yesterday Incredibly difficult to assess volume status with body habitus With creatinine bump overnight, will convert to po and decrease to once daily today   Dr Johney Frame to see later today    Signed, Gypsy Balsam, NP 07/24/2016 9:10 AM  I have seen, examined the  patient, and reviewed the above assessment and plan.  On exam, she is morbidly obese, iRRR. Changes to above are made where necessary.  Would convert cardizem to oral.  Continue xarelto.  Not an ablation candidate due to obesity. Risks of tikosyn discussed with the patient who wishes to proceed.  Will initiate tikosyn.  Refer to bariatric surgery program on discharge.  Co Sign: Hillis Range, MD 07/24/2016 11:12 AM

## 2016-07-25 DIAGNOSIS — I481 Persistent atrial fibrillation: Secondary | ICD-10-CM

## 2016-07-25 LAB — GLUCOSE, CAPILLARY
Glucose-Capillary: 107 mg/dL — ABNORMAL HIGH (ref 65–99)
Glucose-Capillary: 156 mg/dL — ABNORMAL HIGH (ref 65–99)
Glucose-Capillary: 96 mg/dL (ref 65–99)
Glucose-Capillary: 129 mg/dL — ABNORMAL HIGH (ref 65–99)

## 2016-07-25 LAB — BASIC METABOLIC PANEL
Anion gap: 11 (ref 5–15)
BUN: 25 mg/dL — AB (ref 6–20)
CO2: 32 mmol/L (ref 22–32)
Calcium: 9.1 mg/dL (ref 8.9–10.3)
Chloride: 99 mmol/L — ABNORMAL LOW (ref 101–111)
Creatinine, Ser: 1.44 mg/dL — ABNORMAL HIGH (ref 0.44–1.00)
GFR calc Af Amer: 42 mL/min — ABNORMAL LOW (ref >60)
GFR, EST NON AFRICAN AMERICAN: 36 mL/min — AB (ref >60)
GLUCOSE: 109 mg/dL — AB (ref 65–99)
POTASSIUM: 4.4 mmol/L (ref 3.5–5.1)
Sodium: 142 mmol/L (ref 135–145)

## 2016-07-25 LAB — MAGNESIUM: MAGNESIUM: 2.1 mg/dL (ref 1.7–2.4)

## 2016-07-25 MED ORDER — DILTIAZEM HCL ER COATED BEADS 180 MG PO CP24
300.0000 mg | ORAL_CAPSULE | Freq: Every day | ORAL | Status: DC
  Administered 2016-07-25 – 2016-07-27 (×3): 300 mg via ORAL
  Filled 2016-07-25 (×3): qty 1

## 2016-07-25 NOTE — Progress Notes (Signed)
Renee from EP called, stated she has reviewed pt's most recent EKG and pt is clear to receive this evening's tikosyn dose at 2000.

## 2016-07-25 NOTE — Progress Notes (Addendum)
SUBJECTIVE: The patient is doing well today.  At this time, she denies chest pain, shortness of breath, or any new concerns.  CURRENT MEDICATIONS: . aspirin EC  81 mg Oral Daily  . diltiazem  300 mg Oral Daily  . dofetilide  500 mcg Oral BID  . furosemide  40 mg Oral Daily  . insulin aspart  0-15 Units Subcutaneous TID WC  . insulin aspart  0-5 Units Subcutaneous QHS  . rivaroxaban  20 mg Oral Daily  . sodium chloride flush  3 mL Intravenous Q12H     OBJECTIVE: Physical Exam: Vitals:   07/24/16 0520 07/24/16 1438 07/24/16 2001 07/25/16 0555  BP: (!) 155/72 (!) 106/58 (!) 142/61 (!) 150/81  Pulse: (!) 35 (!) 130 68 (!) 138  Resp: 20 19 18 18   Temp: 97.8 F (36.6 C)  98.5 F (36.9 C) 97 F (36.1 C)  TempSrc: Oral  Oral Oral  SpO2: 94% 97% 97% 97%  Weight: (!) 384 lb 8 oz (174.4 kg)   (!) 361 lb 8 oz (164 kg)  Height:        Intake/Output Summary (Last 24 hours) at 07/25/16 0744 Last data filed at 07/24/16 1506  Gross per 24 hour  Intake                0 ml  Output              800 ml  Net             -800 ml    Telemetry reveals atrial flutter   GEN- The patient is morbidly obese appearing, alert and oriented x 3 today.   Head- normocephalic, atraumatic Eyes-  Sclera clear, conjunctiva pink Ears- hearing intact Oropharynx- clear Neck- supple  Lungs- Clear to ausculation bilaterally, normal work of breathing Heart- Tachycardic regular rate and rhythm  GI- soft, NT, ND, + BS Extremities- no clubbing, cyanosis, or edema Skin- no rash or lesion Psych- euthymic mood, full affect Neuro- strength and sensation are intact  LABS: Basic Metabolic Panel:  Recent Labs  88/82/80 0356  07/24/16 1843 07/25/16 0411  NA 144  --   --  142  K 4.1  --   --  4.4  CL 100*  --   --  99*  CO2 33*  --   --  32  GLUCOSE 126*  --   --  109*  BUN 26*  --   --  25*  CREATININE 1.45*  --   --  1.44*  CALCIUM 9.0  --   --  9.1  MG  --   < > 2.6* 2.1  < > = values in this  interval not displayed. Liver Function Tests:  Recent Labs  07/23/16 1745  AST 51*  ALT 24  ALKPHOS 69  BILITOT 1.4*  PROT 6.9  ALBUMIN 3.8   CBC:  Recent Labs  07/23/16 1745  WBC 12.2*  NEUTROABS 9.2*  HGB 11.3*  HCT 37.7  MCV 93.3  PLT 248    RADIOLOGY: Dg Chest Portable 1 View Result Date: 07/23/2016 CLINICAL DATA:  Shortness of breath, tachycardia EXAM: PORTABLE CHEST 1 VIEW COMPARISON:  CT chest 07/14/2014 FINDINGS: There is bilateral mild interstitial thickening. There is no focal parenchymal opacity. There is no pleural effusion or pneumothorax. There is stable cardiomegaly. The osseous structures are unremarkable. IMPRESSION: Cardiomegaly with mild pulmonary vascular congestion. Electronically Signed   By: Elige Ko   On: 07/23/2016 18:45  ASSESSMENT AND PLAN:  Principal Problem:   Atrial flutter with rapid ventricular response (HCC) Active Problems:   Diet-controlled diabetes mellitus (HCC)   Essential hypertension, benign   Hypothyroidism   Chronic diastolic heart failure (HCC)   Super Obesity (BMI 58.8)   Pericardial effusion- July 2013   Atrial fibrillation (HCC)   OSA (obstructive sleep apnea)   Obesity hypoventilation syndrome (HCC)   Chronic anticoagulation   Non-ischemic cardiomyopathy (HCC)   RBBB   Atrial fibrillation with RVR (HCC)   Persistent atrial fibrillation (HCC)  1.  Recurrent persistent atrial fibrillation/flutter The patient has both atrial fibrillation and atrial flutter documented. She states that she currently feels as well now in rate controlled atrial flutter as she did in SR post cardioversion. I think that trying to maintain SR is reasonable if we can with medications. With her weight and co-morbidities, she is not a candidate for EP procedures. She has underlying RBBB which limits AAD options some. Plan was to start Tikosyn last night, but dose was held 2/2 concerns about QTc She has underlying RBBB which will need to be  taken into account when calculating QT interval Will start Tikosyn this morning BMET, Mg stable Please do not hold without talking to EP MD  Will increase rate control today  2. Morbid obesity Body mass index is 58.46 kg/m. Refer to bariatric surgery at discharge  3. OSA Compliance with CPAP encouraged  4. HTN Stable No change required today  5.  Acute on chronic diastolic heart failure Lasix started yesterday Incredibly difficult to assess volume status with body habitus Continue daily po Lasix  Gypsy Balsam, NP 07/25/2016 7:46 AM  I have seen, examined the patient, and reviewed the above assessment and plan.  Changes to above are made where necessary.  On exam, iRRR.  Will continue tikosyn.  Anticipate cardioversion tomorrow if not in sinus.  Hopefully home on Friday afternoon (after 5th dose).  Follow-up with AF clinic post discharge.  Co Sign: Hillis Range, MD 07/25/2016 12:18 PM

## 2016-07-25 NOTE — Consult Note (Signed)
   [facility] Inpatient Consult   07/25/2016  NASEEM MAKHOUL May 13, 1946 [id number]   Patient was evaluated for the Medicare ACO Registry.  Patient had recently discharged home on 07/17/16.  According to the medical record per MD, the patient is a 71 y/o Caucasian female with a history of super obesity-BMI 58.9, COPD and hypoventilation- on chronic O2, PAF, HTN, NICM, OSA with C-pap intolerance, and prior pericardial effusion in 2013 (tapped of inflammatory fluid), re admitted to the hospital 07/23/16 with recurrent AF with RVR.  Spoke with inpatient RNCM, Silva Bandy, who states the patient is currently being treated with Tikosyn and anticipates her to return home with self care.  No needs noted.  Please contact:  Charlesetta Shanks, RN BSN CCM [facility]  [phone] business mobile phone Toll free office [phone]

## 2016-07-25 NOTE — Progress Notes (Signed)
Pt HR sustaining 135-140 this morning.  She is asymptomatic.  ADministering scheduled ditalizem now.  Uvaldo Rising, NP notified, is okay to continue monitoring pt and watch HR as long as pt is asymptomatic.  Plan for cardioversion tomorrow.

## 2016-07-25 NOTE — Discharge Instructions (Signed)
You have an appointment set up with the Atrial Fibrillation Clinic.  Multiple studies have shown that being followed by a dedicated atrial fibrillation clinic in addition to the standard care you receive from your other physicians improves health. We believe that enrollment in the atrial fibrillation clinic will allow us to better care for you.  ° °The phone number to the Atrial Fibrillation Clinic is [phone]. The clinic is staffed Monday through Friday from 8:30am to 5pm. ° °Parking Directions: The clinic is located in the Heart and Vascular Building connected to [facility]. °1)From [address] turn on to [address] and go to the 3rd entrance  (Heart and Vascular entrance) on the right. °2)Look to the right for Heart &Vascular Parking Garage. °3)  code for the entrance is 5001.   °4)Take the elevators to the 1st floor. Registration is in the room with the glass walls at the end of the hallway. ° °If you have any trouble parking or locating the clinic, please don’t hesitate to call [phone]. °

## 2016-07-26 ENCOUNTER — Encounter (HOSPITAL_COMMUNITY)
Admission: EM | Disposition: A | Discharge status: Home / Self Care | Payer: Self-pay | Source: Home / Self Care | Attending: Internal Medicine

## 2016-07-26 LAB — BASIC METABOLIC PANEL
Anion gap: 8 (ref 5–15)
BUN: 28 mg/dL — AB (ref 6–20)
CALCIUM: 9.3 mg/dL (ref 8.9–10.3)
CO2: 37 mmol/L — AB (ref 22–32)
CREATININE: 1.38 mg/dL — AB (ref 0.44–1.00)
Chloride: 98 mmol/L — ABNORMAL LOW (ref 101–111)
GFR calc non Af Amer: 38 mL/min — ABNORMAL LOW (ref >60)
GFR, EST AFRICAN AMERICAN: 44 mL/min — AB (ref >60)
GLUCOSE: 117 mg/dL — AB (ref 65–99)
Potassium: 4.4 mmol/L (ref 3.5–5.1)
Sodium: 143 mmol/L (ref 135–145)

## 2016-07-26 LAB — MAGNESIUM: Magnesium: 1.9 mg/dL (ref 1.7–2.4)

## 2016-07-26 LAB — GLUCOSE, CAPILLARY
GLUCOSE-CAPILLARY: 121 mg/dL — AB (ref 65–99)
GLUCOSE-CAPILLARY: 110 mg/dL — AB (ref 65–99)
GLUCOSE-CAPILLARY: 97 mg/dL (ref 65–99)
GLUCOSE-CAPILLARY: 106 mg/dL — AB (ref 65–99)

## 2016-07-26 SURGERY — CARDIOVERSION
Anesthesia: Monitor Anesthesia Care

## 2016-07-26 MED ORDER — MAGNESIUM OXIDE 400 (241.3 MG) MG PO TABS
400.0000 mg | ORAL_TABLET | Freq: Two times a day (BID) | ORAL | Status: DC
  Administered 2016-07-26 – 2016-07-27 (×3): 400 mg via ORAL
  Filled 2016-07-26 (×3): qty 1

## 2016-07-26 NOTE — Progress Notes (Addendum)
Notified oncall PA about vfib episode. EKG performed, vitals taken and labs reviewed. Pt is asymptomatic and denies any pain or complaints. She is resting comfortably in chair. Will continue to monitor closely. Also updated PA that pt converted to sinus rhythm early this morning around 0200.

## 2016-07-26 NOTE — Progress Notes (Signed)
SUBJECTIVE: The patient is doing well today.  At this time, she denies chest pain, shortness of breath, or any new concerns.  CURRENT MEDICATIONS: . aspirin EC  81 mg Oral Daily  . diltiazem  300 mg Oral Daily  . dofetilide  500 mcg Oral BID  . furosemide  40 mg Oral Daily  . insulin aspart  0-15 Units Subcutaneous TID WC  . insulin aspart  0-5 Units Subcutaneous QHS  . magnesium oxide  400 mg Oral BID  . rivaroxaban  20 mg Oral Daily  . sodium chloride flush  3 mL Intravenous Q12H     OBJECTIVE: Physical Exam: Vitals:   07/25/16 0555 07/25/16 1431 07/25/16 2008 07/26/16 0515  BP: (!) 150/81 118/64 108/67 (!) 138/59  Pulse: (!) 138 97 (!) 133 70  Resp: 18 18 20 20   Temp: 97 F (36.1 C)  98 F (36.7 C) 97.6 F (36.4 C)  TempSrc: Oral  Oral Oral  SpO2: 97% 97% 97% 98%  Weight: (!) 361 lb 8 oz (164 kg)   (!) 362 lb 1.6 oz (164.2 kg)  Height:        Intake/Output Summary (Last 24 hours) at 07/26/16 0931 Last data filed at 07/25/16 1544  Gross per 24 hour  Intake                0 ml  Output              600 ml  Net             -600 ml    Telemetry is reviewed by myself, SR, 70's   GEN- The patient is morbidly obese appearing, alert and oriented x 3 today.   Head- normocephalic, atraumatic Eyes-  Sclera clear, conjunctiva pink Ears- hearing intact Oropharynx- clear Neck- supple  Lungs- CTA b/l, normal work of breathing Heart- RRR, soft SM  GI- soft, NT, ND Extremities- no clubbing, cyanosis, or edema Skin- no rash or lesion Psych- euthymic mood, full affect Neuro- strength and sensation are intact  LABS: Basic Metabolic Panel:  Recent Labs  35/45/62 0411 07/26/16 0246  NA 142 143  K 4.4 4.4  CL 99* 98*  CO2 32 37*  GLUCOSE 109* 117*  BUN 25* 28*  CREATININE 1.44* 1.38*  CALCIUM 9.1 9.3  MG 2.1 1.9   Liver Function Tests:  Recent Labs  07/23/16 1745  AST 51*  ALT 24  ALKPHOS 69  BILITOT 1.4*  PROT 6.9  ALBUMIN 3.8   CBC:  Recent  Labs  07/23/16 1745  WBC 12.2*  NEUTROABS 9.2*  HGB 11.3*  HCT 37.7  MCV 93.3  PLT 248    RADIOLOGY: Dg Chest Portable 1 View Result Date: 07/23/2016 CLINICAL DATA:  Shortness of breath, tachycardia EXAM: PORTABLE CHEST 1 VIEW COMPARISON:  CT chest 07/14/2014 FINDINGS: There is bilateral mild interstitial thickening. There is no focal parenchymal opacity. There is no pleural effusion or pneumothorax. There is stable cardiomegaly. The osseous structures are unremarkable. IMPRESSION: Cardiomegaly with mild pulmonary vascular congestion. Electronically Signed   By: Elige Ko   On: 07/23/2016 18:45    ASSESSMENT AND PLAN:  Principal Problem:   Atrial flutter with rapid ventricular response (HCC) Active Problems:   Diet-controlled diabetes mellitus (HCC)   Essential hypertension, benign   Hypothyroidism   Chronic diastolic heart failure (HCC)   Super Obesity (BMI 58.8)   Pericardial effusion- July 2013   Atrial fibrillation (HCC)   OSA (obstructive sleep apnea)  Obesity hypoventilation syndrome (HCC)   Chronic anticoagulation   Non-ischemic cardiomyopathy (HCC)   RBBB   Atrial fibrillation with RVR (HCC)   Persistent atrial fibrillation (HCC)  1.  Recurrent persistent atrial fibrillation/flutter The patient has both atrial fibrillation and atrial flutter documented.  In review of notes, Dr. Johney Frame stated "I think that trying to maintain SR is reasonable if we can with medications. With her weight and co-morbidities, she is not a candidate for EP procedures.  She has underlying RBBB which limits AAD options some.  She feels well this morning in SR Tikosyn load in process s/p 3rd dose this morning K+ 4.4  Mag 1.9, Toretto Tingler start Mag Ox Creat 1.38, stable QTc stable, reviewed by Dr. Elberta Fortis, [facility] to continue current dose  Please do not hold without talking to EP MD Anticipate discharge tomorrow after her 5th dose, as planned by Dr. Johney Frame.   2. Morbid obesity Body mass index  is 58.46 kg/m. Plans to refer to bariatric surgery at discharge  3. OSA Compliance with CPAP encouraged  4. HTN Stable No change required today  5.  Acute on chronic diastolic heart failure Incredibly difficult to assess volume status with body habitus Continue daily po Lasix Patient without c/o SOB, labs stable   Francis Dowse, PA-C 07/26/2016 9:31 AM   I have seen and examined this patient with Francis Dowse.  Agree with above, note added to reflect my findings.  On exam, regular rhythm, no murmurs, lungs clear. Converted to sinus rhythm after 3rd dose of tikosyn. QTc stable. Continue at 500 mcg.    Cagney Steenson M. Tonie Vizcarrondo MD 07/26/2016 11:44 AM

## 2016-07-26 NOTE — Progress Notes (Signed)
Pt converted over to normal sinus rhythm. She's resting comfortably and denies complications.

## 2016-07-27 ENCOUNTER — Emergency Department (HOSPITAL_COMMUNITY): Payer: Medicare Other

## 2016-07-27 ENCOUNTER — Encounter (HOSPITAL_COMMUNITY): Payer: Self-pay

## 2016-07-27 ENCOUNTER — Inpatient Hospital Stay (HOSPITAL_COMMUNITY)
Admission: EM | Admit: 2016-07-27 | Discharge: 2016-08-23 | DRG: 308 | Disposition: E | Payer: Medicare Other | Attending: Pulmonary Disease | Admitting: Pulmonary Disease

## 2016-07-27 DIAGNOSIS — R570 Cardiogenic shock: Secondary | ICD-10-CM | POA: Diagnosis present

## 2016-07-27 DIAGNOSIS — I4901 Ventricular fibrillation: Principal | ICD-10-CM

## 2016-07-27 DIAGNOSIS — K219 Gastro-esophageal reflux disease without esophagitis: Secondary | ICD-10-CM | POA: Diagnosis present

## 2016-07-27 DIAGNOSIS — E785 Hyperlipidemia, unspecified: Secondary | ICD-10-CM | POA: Diagnosis present

## 2016-07-27 DIAGNOSIS — E039 Hypothyroidism, unspecified: Secondary | ICD-10-CM | POA: Diagnosis present

## 2016-07-27 DIAGNOSIS — J9600 Acute respiratory failure, unspecified whether with hypoxia or hypercapnia: Secondary | ICD-10-CM

## 2016-07-27 DIAGNOSIS — I469 Cardiac arrest, cause unspecified: Secondary | ICD-10-CM | POA: Diagnosis present

## 2016-07-27 DIAGNOSIS — D696 Thrombocytopenia, unspecified: Secondary | ICD-10-CM | POA: Diagnosis present

## 2016-07-27 DIAGNOSIS — R402212 Coma scale, best verbal response, none, at arrival to emergency department: Secondary | ICD-10-CM | POA: Diagnosis not present

## 2016-07-27 DIAGNOSIS — G931 Anoxic brain damage, not elsewhere classified: Secondary | ICD-10-CM | POA: Diagnosis present

## 2016-07-27 DIAGNOSIS — R748 Abnormal levels of other serum enzymes: Secondary | ICD-10-CM

## 2016-07-27 DIAGNOSIS — Z9049 Acquired absence of other specified parts of digestive tract: Secondary | ICD-10-CM

## 2016-07-27 DIAGNOSIS — Z87891 Personal history of nicotine dependence: Secondary | ICD-10-CM

## 2016-07-27 DIAGNOSIS — Z7901 Long term (current) use of anticoagulants: Secondary | ICD-10-CM

## 2016-07-27 DIAGNOSIS — I248 Other forms of acute ischemic heart disease: Secondary | ICD-10-CM | POA: Diagnosis present

## 2016-07-27 DIAGNOSIS — J9690 Respiratory failure, unspecified, unspecified whether with hypoxia or hypercapnia: Secondary | ICD-10-CM | POA: Diagnosis not present

## 2016-07-27 DIAGNOSIS — I481 Persistent atrial fibrillation: Secondary | ICD-10-CM | POA: Diagnosis present

## 2016-07-27 DIAGNOSIS — G253 Myoclonus: Secondary | ICD-10-CM | POA: Diagnosis not present

## 2016-07-27 DIAGNOSIS — Z800 Family history of malignant neoplasm of digestive organs: Secondary | ICD-10-CM

## 2016-07-27 DIAGNOSIS — I447 Left bundle-branch block, unspecified: Secondary | ICD-10-CM | POA: Diagnosis present

## 2016-07-27 DIAGNOSIS — E875 Hyperkalemia: Secondary | ICD-10-CM | POA: Diagnosis not present

## 2016-07-27 DIAGNOSIS — R402312 Coma scale, best motor response, none, at arrival to emergency department: Secondary | ICD-10-CM | POA: Diagnosis present

## 2016-07-27 DIAGNOSIS — I313 Pericardial effusion (noninflammatory): Secondary | ICD-10-CM | POA: Diagnosis present

## 2016-07-27 DIAGNOSIS — R402112 Coma scale, eyes open, never, at arrival to emergency department: Secondary | ICD-10-CM | POA: Diagnosis not present

## 2016-07-27 DIAGNOSIS — I4892 Unspecified atrial flutter: Secondary | ICD-10-CM | POA: Diagnosis present

## 2016-07-27 DIAGNOSIS — T462X5A Adverse effect of other antidysrhythmic drugs, initial encounter: Secondary | ICD-10-CM | POA: Diagnosis present

## 2016-07-27 DIAGNOSIS — J690 Pneumonitis due to inhalation of food and vomit: Secondary | ICD-10-CM | POA: Diagnosis not present

## 2016-07-27 DIAGNOSIS — R0902 Hypoxemia: Secondary | ICD-10-CM

## 2016-07-27 DIAGNOSIS — Z881 Allergy status to other antibiotic agents status: Secondary | ICD-10-CM

## 2016-07-27 DIAGNOSIS — E662 Morbid (severe) obesity with alveolar hypoventilation: Secondary | ICD-10-CM | POA: Diagnosis present

## 2016-07-27 DIAGNOSIS — Z9289 Personal history of other medical treatment: Secondary | ICD-10-CM

## 2016-07-27 DIAGNOSIS — J80 Acute respiratory distress syndrome: Secondary | ICD-10-CM | POA: Diagnosis present

## 2016-07-27 DIAGNOSIS — Z833 Family history of diabetes mellitus: Secondary | ICD-10-CM

## 2016-07-27 DIAGNOSIS — E1165 Type 2 diabetes mellitus with hyperglycemia: Secondary | ICD-10-CM | POA: Diagnosis present

## 2016-07-27 DIAGNOSIS — E1122 Type 2 diabetes mellitus with diabetic chronic kidney disease: Secondary | ICD-10-CM | POA: Diagnosis present

## 2016-07-27 DIAGNOSIS — E11649 Type 2 diabetes mellitus with hypoglycemia without coma: Secondary | ICD-10-CM | POA: Diagnosis present

## 2016-07-27 DIAGNOSIS — I959 Hypotension, unspecified: Secondary | ICD-10-CM | POA: Diagnosis present

## 2016-07-27 DIAGNOSIS — Z8249 Family history of ischemic heart disease and other diseases of the circulatory system: Secondary | ICD-10-CM

## 2016-07-27 DIAGNOSIS — D649 Anemia, unspecified: Secondary | ICD-10-CM | POA: Diagnosis present

## 2016-07-27 DIAGNOSIS — Z6841 Body Mass Index (BMI) 40.0 and over, adult: Secondary | ICD-10-CM

## 2016-07-27 DIAGNOSIS — J9601 Acute respiratory failure with hypoxia: Secondary | ICD-10-CM

## 2016-07-27 DIAGNOSIS — G9340 Encephalopathy, unspecified: Secondary | ICD-10-CM

## 2016-07-27 DIAGNOSIS — I130 Hypertensive heart and chronic kidney disease with heart failure and stage 1 through stage 4 chronic kidney disease, or unspecified chronic kidney disease: Secondary | ICD-10-CM | POA: Diagnosis present

## 2016-07-27 DIAGNOSIS — J189 Pneumonia, unspecified organism: Secondary | ICD-10-CM

## 2016-07-27 DIAGNOSIS — Z803 Family history of malignant neoplasm of breast: Secondary | ICD-10-CM

## 2016-07-27 DIAGNOSIS — I480 Paroxysmal atrial fibrillation: Secondary | ICD-10-CM | POA: Diagnosis present

## 2016-07-27 DIAGNOSIS — I249 Acute ischemic heart disease, unspecified: Secondary | ICD-10-CM

## 2016-07-27 DIAGNOSIS — Z9981 Dependence on supplemental oxygen: Secondary | ICD-10-CM

## 2016-07-27 DIAGNOSIS — T884XXA Failed or difficult intubation, initial encounter: Secondary | ICD-10-CM | POA: Diagnosis not present

## 2016-07-27 DIAGNOSIS — S0003XA Contusion of scalp, initial encounter: Secondary | ICD-10-CM | POA: Diagnosis present

## 2016-07-27 DIAGNOSIS — N183 Chronic kidney disease, stage 3 (moderate): Secondary | ICD-10-CM | POA: Diagnosis present

## 2016-07-27 DIAGNOSIS — I5042 Chronic combined systolic (congestive) and diastolic (congestive) heart failure: Secondary | ICD-10-CM | POA: Diagnosis present

## 2016-07-27 DIAGNOSIS — I429 Cardiomyopathy, unspecified: Secondary | ICD-10-CM | POA: Diagnosis present

## 2016-07-27 DIAGNOSIS — Z818 Family history of other mental and behavioral disorders: Secondary | ICD-10-CM

## 2016-07-27 DIAGNOSIS — Z452 Encounter for adjustment and management of vascular access device: Secondary | ICD-10-CM

## 2016-07-27 DIAGNOSIS — E876 Hypokalemia: Secondary | ICD-10-CM | POA: Diagnosis not present

## 2016-07-27 DIAGNOSIS — E874 Mixed disorder of acid-base balance: Secondary | ICD-10-CM | POA: Diagnosis not present

## 2016-07-27 DIAGNOSIS — Z515 Encounter for palliative care: Secondary | ICD-10-CM | POA: Diagnosis not present

## 2016-07-27 DIAGNOSIS — Z4682 Encounter for fitting and adjustment of non-vascular catheter: Secondary | ICD-10-CM | POA: Diagnosis not present

## 2016-07-27 DIAGNOSIS — J449 Chronic obstructive pulmonary disease, unspecified: Secondary | ICD-10-CM | POA: Diagnosis present

## 2016-07-27 DIAGNOSIS — R569 Unspecified convulsions: Secondary | ICD-10-CM | POA: Diagnosis not present

## 2016-07-27 DIAGNOSIS — Z9889 Other specified postprocedural states: Secondary | ICD-10-CM

## 2016-07-27 DIAGNOSIS — N179 Acute kidney failure, unspecified: Secondary | ICD-10-CM | POA: Diagnosis present

## 2016-07-27 DIAGNOSIS — Z0189 Encounter for other specified special examinations: Secondary | ICD-10-CM

## 2016-07-27 DIAGNOSIS — Z66 Do not resuscitate: Secondary | ICD-10-CM | POA: Diagnosis not present

## 2016-07-27 DIAGNOSIS — Z8261 Family history of arthritis: Secondary | ICD-10-CM

## 2016-07-27 DIAGNOSIS — I4891 Unspecified atrial fibrillation: Secondary | ICD-10-CM

## 2016-07-27 LAB — BASIC METABOLIC PANEL
ANION GAP: 13 (ref 5–15)
BUN: 29 mg/dL — ABNORMAL HIGH (ref 6–20)
CHLORIDE: 96 mmol/L — AB (ref 101–111)
CO2: 35 mmol/L — AB (ref 22–32)
Calcium: 9.2 mg/dL (ref 8.9–10.3)
Creatinine, Ser: 1.25 mg/dL — ABNORMAL HIGH (ref 0.44–1.00)
GFR calc Af Amer: 50 mL/min — ABNORMAL LOW (ref >60)
GFR, EST NON AFRICAN AMERICAN: 43 mL/min — AB (ref >60)
GLUCOSE: 102 mg/dL — AB (ref 65–99)
POTASSIUM: 4.2 mmol/L (ref 3.5–5.1)
Sodium: 144 mmol/L (ref 135–145)

## 2016-07-27 LAB — I-STAT CG4 LACTIC ACID, ED: LACTIC ACID, VENOUS: 7.87 mmol/L — AB (ref 0.5–1.9)

## 2016-07-27 LAB — I-STAT CHEM 8, ED
BUN: 39 mg/dL — ABNORMAL HIGH (ref 6–20)
CALCIUM ION: 1.21 mmol/L (ref 1.15–1.40)
CHLORIDE: 98 mmol/L — AB (ref 101–111)
Creatinine, Ser: 1.6 mg/dL — ABNORMAL HIGH (ref 0.44–1.00)
Glucose, Bld: 314 mg/dL — ABNORMAL HIGH (ref 65–99)
HCT: 37 % (ref 36.0–46.0)
Hemoglobin: 12.6 g/dL (ref 12.0–15.0)
POTASSIUM: 4.2 mmol/L (ref 3.5–5.1)
Sodium: 143 mmol/L (ref 135–145)
TCO2: 27 mmol/L (ref 0–100)

## 2016-07-27 LAB — I-STAT TROPONIN, ED: Troponin i, poc: 0.09 ng/mL (ref 0.00–0.08)

## 2016-07-27 LAB — GLUCOSE, CAPILLARY
Glucose-Capillary: 117 mg/dL — ABNORMAL HIGH (ref 65–99)
Glucose-Capillary: 109 mg/dL — ABNORMAL HIGH (ref 65–99)

## 2016-07-27 LAB — MAGNESIUM: Magnesium: 2 mg/dL (ref 1.7–2.4)

## 2016-07-27 MED ORDER — DILTIAZEM HCL ER COATED BEADS 300 MG PO CP24: 300.0000 mg | ORAL_CAPSULE | Freq: Every day | ORAL | 3 refills | Status: AC

## 2016-07-27 MED ORDER — MIDAZOLAM HCL 2 MG/2ML IJ SOLN: 1.0000 mg | INTRAMUSCULAR | Status: DC | PRN

## 2016-07-27 MED ORDER — ETOMIDATE 2 MG/ML IV SOLN
INTRAVENOUS | Status: AC | PRN
  Administered 2016-07-27: 15 mg via INTRAVENOUS

## 2016-07-27 MED ORDER — MAGNESIUM OXIDE 400 (241.3 MG) MG PO TABS: 400.0000 mg | ORAL_TABLET | Freq: Two times a day (BID) | ORAL | 3 refills | Status: AC

## 2016-07-27 MED ORDER — CALCIUM CHLORIDE 10 % IV SOLN
INTRAVENOUS | Status: AC | PRN
  Administered 2016-07-27: 1 g via INTRAVENOUS

## 2016-07-27 MED ORDER — SODIUM CHLORIDE 0.9 % IV SOLN
INTRAVENOUS | Status: DC
  Administered 2016-07-29: 01:00:00 via INTRAVENOUS

## 2016-07-27 MED ORDER — FENTANYL CITRATE (PF) 100 MCG/2ML IJ SOLN: 50.0000 ug | INTRAMUSCULAR | Status: DC | PRN

## 2016-07-27 MED ORDER — SODIUM BICARBONATE 8.4 % IV SOLN
INTRAVENOUS | Status: AC | PRN
  Administered 2016-07-27: 50 meq via INTRAVENOUS

## 2016-07-27 MED ORDER — EPINEPHRINE PF 1 MG/ML IJ SOLN
0.5000 ug/min | INTRAMUSCULAR | Status: DC
  Administered 2016-07-27: 5 ug/min via INTRAVENOUS
  Administered 2016-07-28 (×2): 20 ug/min via INTRAVENOUS
  Administered 2016-07-28: 11 ug/min via INTRAVENOUS
  Administered 2016-07-28 (×2): 18 ug/min via INTRAVENOUS
  Filled 2016-07-27 (×5): qty 4

## 2016-07-27 MED ORDER — ASPIRIN 300 MG RE SUPP: 300.0000 mg | RECTAL | Status: DC

## 2016-07-27 MED ORDER — ROCURONIUM BROMIDE 50 MG/5ML IV SOLN
INTRAVENOUS | Status: AC | PRN
  Administered 2016-07-27: 200 mg via INTRAVENOUS

## 2016-07-27 MED ORDER — EPINEPHRINE PF 1 MG/10ML IJ SOSY
PREFILLED_SYRINGE | INTRAMUSCULAR | Status: AC | PRN
  Administered 2016-07-27: 1 via INTRAVENOUS

## 2016-07-27 MED ORDER — DOFETILIDE 500 MCG PO CAPS: 500.0000 ug | ORAL_CAPSULE | Freq: Two times a day (BID) | ORAL | 3 refills | Status: AC

## 2016-07-27 NOTE — ED Notes (Signed)
[facility] contacted to activate [employer]

## 2016-07-27 NOTE — ED Notes (Signed)
A copy of lactic acid given to Dr Eudelia Bunch 7.87

## 2016-07-27 NOTE — Code Documentation (Signed)
Pt with large bruise to R forehead upon arrival.

## 2016-07-27 NOTE — ED Provider Notes (Signed)
[facility]-EMERGENCY DEPT Provider Note   CSN: [id number] Arrival date & time: 08/11/2016  2244     History   Chief Complaint Chief Complaint  Patient presents with  . Other    Post CPR    HPI Nancy West is a 71 y.o. female.  The history is provided by the EMS personnel.  Cardiac Arrest  Witnessed by:  Family member Incident location:  Home Time since incident:  2 hours Time before BLS initiated:  > 5 minutes Time before ALS initiated:  > 10 minutes Condition upon EMS arrival:  Unresponsive Pulse:  Absent Initial cardiac rhythm per EMS:  Ventricular fibrillation (went into afib after 3 defibs) Treatments prior to arrival:  Intubation and ACLS protocol Medications given prior to ED:  Amiodarone (epi x7) Airway: intubation with king airway. Rhythm on admission to ED: sinus bradycardia vs slow afib. Associated symptoms: loss of consciousness   Was admitted on 07/23/16 for symptomatic a-flutter, not a canidate for ablation, placed on tikosyn. Was discharged this morning. Witness arrest by husband at home. ROSC obtained by EMS after 7x epi and 3 defib. Placed on epi drip but still bradycardic with pulse. Being externally paced with a palpable pulse.  Past Medical History:  Diagnosis Date  . Anemia    Noted 12/2011  . Atrial fib/flutter, transient    In setting of pericardial effusion 12/2011  . Chest pain    a. 12/2011 Neg Lexi MV, EF 56%.    . Diastolic CHF (HCC)   . GERD (gastroesophageal reflux disease)   . Hyperlipidemia   . Hypertension   . Hypothyroidism   . Obesity   . OSA (obstructive sleep apnea) 11/05/2013   noncompliant with CPAP  . Pericardial effusion    a. Large, s/p pericardiocentesis 01/02/12 yielding fluid (neg malignant cells, only inflammatory cells)  . Pneumonia 09/01/2013   "for the first time" (09/01/2013)  . Tobacco dependence    a. 46 pack yr hx.  . Type 2 diabetes, diet controlled (HCC)   . Venous stasis of lower extremity    a. chronic LEE      Patient Active Problem List   Diagnosis Date Noted  . Persistent atrial fibrillation (HCC) 07/24/2016  . Chronic anticoagulation 07/23/2016  . Non-ischemic cardiomyopathy (HCC) 07/23/2016  . RBBB 07/23/2016  . Atrial fibrillation with RVR (HCC) 07/23/2016  . Atrial flutter with rapid ventricular response (HCC) 07/14/2016  . Controlled diabetes mellitus with microalbuminuria (HCC) 01/25/2016  . Major depressive disorder, single episode, moderate (HCC) 11/29/2014  . Advance care planning 11/29/2014  . OSA (obstructive sleep apnea) 11/05/2013  . Obesity hypoventilation syndrome (HCC) 11/05/2013  . COPD (chronic obstructive pulmonary disease) with chronic bronchitis (HCC) 11/05/2013  . Dyspnea 09/01/2013  . CHF exacerbation (HCC) 09/01/2013  . Chest pain 01/21/2012  . Pericardial effusion- July 2013 01/02/2012  . Atrial fibrillation (HCC) 01/02/2012  . Super Obesity (BMI 58.8) 12/13/2011  . Chronic diastolic heart failure (HCC) 02/21/2011  . Diet-controlled diabetes mellitus (HCC) 11/17/2010  . Essential hypertension, benign 11/17/2010  . Pure hypercholesterolemia 11/17/2010  . Hypothyroidism 11/17/2010  . Unspecified vitamin D deficiency 11/17/2010    Past Surgical History:  Procedure Laterality Date  . APPENDECTOMY  ~ 2008  . BREAST BIOPSY Right 1980's   a. Benign   . CARDIOVERSION N/A 07/16/2016   Procedure: CARDIOVERSION;  Surgeon: Thurmon Fair, MD;  Location: [facility] ENDOSCOPY;  Service: Cardiovascular;  Laterality: N/A;  . CHOLECYSTECTOMY  1980's  . PERICARDIAL TAP N/A 01/02/2012  Procedure: PERICARDIAL TAP;  Surgeon: Kathleene Hazel, MD;  Location: [facility] CATH LAB;  Service: Cardiovascular;  Laterality: N/A;  . PERICARDIOCENTESIS  12/2011   yielding fluid (neg malignant cells, only inflammatory cells)/notes 12/30/2011  . TEE WITHOUT CARDIOVERSION N/A 07/16/2016   Procedure: TRANSESOPHAGEAL ECHOCARDIOGRAM (TEE);  Surgeon: Thurmon Fair, MD;  Location: [facility] ENDOSCOPY;   Service: Cardiovascular;  Laterality: N/A;    OB History    Gravida Para Term Preterm AB Living   0 0 0 0 0 0   SAB TAB Ectopic Multiple Live Births   0 0 0 0         Home Medications    Prior to Admission medications   Medication Sig Start Date End Date Taking? Authorizing Provider  acetaminophen (TYLENOL) 650 MG CR tablet Take 650 mg by mouth every 8 (eight) hours as needed (for back pain).    Historical Provider, MD  Albuterol Sulfate (PROAIR RESPICLICK) 108 (90 BASE) MCG/ACT AEPB Inhale 2 puffs into the lungs every 6 (six) hours as needed. Patient not taking: Reported on 07/23/2016 03/12/14   Coralyn Helling, MD  atorvastatin (LIPITOR) 40 MG tablet TAKE 1 TABLET(40 MG) BY MOUTH DAILY 07/23/16   Joselyn Arrow, MD  benazepril (LOTENSIN) 20 MG tablet Take 1 tablet (20 mg total) by mouth daily. 07/13/16   Vesta Mixer, MD  Cholecalciferol (VITAMIN D) 1000 UNITS capsule Take 1,000 Units by mouth daily.     Historical Provider, MD  diltiazem (CARDIZEM CD) 300 MG 24 hr capsule Take 1 capsule (300 mg total) by mouth daily. 07/28/16   Amber Caryl Bis, NP  dofetilide (TIKOSYN) 500 MCG capsule Take 1 capsule (500 mcg total) by mouth 2 (two) times daily. 07/31/16   Amber Caryl Bis, NP  FLUoxetine (PROZAC) 20 MG capsule Take 1 capsule (20 mg total) by mouth daily. 05/14/16   Joselyn Arrow, MD  furosemide (LASIX) 40 MG tablet TAKE 1 TABLET BY MOUTH TWICE DAILY 05/31/15   Vesta Mixer, MD  levothyroxine (SYNTHROID, LEVOTHROID) 50 MCG tablet TAKE 1 TABLET(50 MCG) BY MOUTH DAILY 07/12/16   Joselyn Arrow, MD  magnesium oxide (MAG-OX) 400 (241.3 Mg) MG tablet Take 1 tablet (400 mg total) by mouth 2 (two) times daily. Jul 31, 2016   Amber Caryl Bis, NP  metoprolol tartrate (LOPRESSOR) 25 MG tablet Take 1 tablet (25 mg total) by mouth 2 (two) times daily. 07/13/16   Vesta Mixer, MD  Multiple Vitamins-Minerals (ONE-A-DAY WOMENS 50+ ADVANTAGE) TABS Take 1 tablet by mouth daily after breakfast.    Historical Provider, MD  mupirocin  cream (BACTROBAN) 2 % Apply 1 application topically 2 (two) times daily. To affected areas near tailbone 05/14/16   Historical Provider, MD  omeprazole (PRILOSEC) 20 MG capsule Take 1 capsule (20 mg total) by mouth 2 (two) times daily. 01/21/12   Rosalio Macadamia, NP  potassium chloride (K-DUR,KLOR-CON) 10 MEQ tablet Take 1 tablet (10 mEq total) by mouth daily. 07/13/16   Vesta Mixer, MD  rivaroxaban (XARELTO) 20 MG TABS tablet Take 1 tablet (20 mg total) by mouth daily with supper. 07/17/16   Marily Lente, NP  SYMBICORT 160-4.5 MCG/ACT inhaler INHALE 2 PUFFS INTO THE LUNGS TWICE DAILY 07/13/16   Coralyn Helling, MD    Family History Family History  Problem Relation Age of Onset  . Breast cancer Mother 68  . Hypertension Mother   . Arthritis Mother   . Anxiety disorder Mother   . Colon cancer Mother   .  Diabetes Father     died in MVA  . Healthy Sister     Social History Social History  Substance Use Topics  . Smoking status: Former Smoker    Years: 50.00    Types: Cigarettes    Quit date: 12/16/2013  . Smokeless tobacco: Never Used  . Alcohol use No     Allergies   Augmentin [amoxicillin-pot clavulanate]   Review of Systems Review of Systems  Unable to perform ROS: Patient unresponsive  Neurological: Positive for loss of consciousness.     Physical Exam Updated Vital Signs BP 127/95   Pulse (!) 54   Resp 20   SpO2 95%   Physical Exam  Constitutional: She is intubated.  Morbidly obese, king airway in place  HENT:  Head: Normocephalic and atraumatic.  Eyes: Conjunctivae are normal. Pupils are equal, round, and reactive to light.  Neck: Trachea normal, normal range of motion and phonation normal. Neck supple.  Cardiovascular: Intact distal pulses.  An irregularly irregular rhythm present. Bradycardia present.   Pulses:      Femoral pulses are 1+ on the right side, and 1+ on the left side. Pulmonary/Chest: She is intubated. She has rhonchi (throughout).    Abdominal: Soft. She exhibits no distension.  Musculoskeletal: She exhibits edema (bl LE pitting edema).  IO in R tibia  Neurological: She is unresponsive. GCS eye subscore is 1. GCS verbal subscore is 1. GCS motor subscore is 1.  Skin: Skin is warm.  Nursing note and vitals reviewed.    ED Treatments / Results  Labs (all labs ordered are listed, but only abnormal results are displayed) Labs Reviewed - No data to display  EKG  EKG Interpretation None       Radiology No results found.  Procedures Procedure Name: Intubation Date/Time: 08-02-2016 11:17 PM Performed by: Leonel Mccollum [place] Pre-anesthesia Checklist: Patient identified, Emergency Drugs available, Suction available, Patient being monitored and Timeout performed Oxygen Delivery Method: Ambu bag Preoxygenation: Pre-oxygenation with 100% oxygen Intubation Type: IV induction and Rapid sequence Laryngoscope Size: Glidescope Tube size: 7.5 mm Number of attempts: 1 Airway Equipment and Method: Patient positioned with wedge pillow,  Rigid stylet and Video-laryngoscopy Placement Confirmation: ETT inserted through vocal cords under direct vision,  CO2 detector and Breath sounds checked- equal and bilateral Secured at: 24 cm Tube secured with: ETT holder      (including critical care time)  Medications Ordered in ED Medications  EPINEPHrine (ADRENALIN) 1 MG/10ML injection (1 Syringe Intravenous Given 02-Aug-2016 2250)  sodium bicarbonate injection (50 mEq Intravenous Given 02-Aug-2016 2250)  calcium chloride injection (1 g Intravenous Given Aug 02, 2016 2251)  EPINEPHrine (ADRENALIN) 4 mg in dextrose 5 % 250 mL (0.016 mg/mL) infusion (25 mcg/min Intravenous Rate/Dose Change 2016-08-02 2300)  etomidate (AMIDATE) injection (15 mg Intravenous Given August 02, 2016 2309)  rocuronium (ZEMURON) injection (200 mg Intravenous Given 08-02-16 2309)     Initial Impression / Assessment and Plan / ED Course  I have reviewed the triage vital signs and the  nursing notes.  Pertinent labs & imaging results that were available during my care of the patient were reviewed by me and considered in my medical decision making (see chart for details).    71 year old female presenting as a postarrest. She was just discharged earlier today for symptomatic A. Flutter and placed on Tikosyn. She went home and arrested in front of her husband. EMS states that she was down for 20-30 minutes before they arrived and then ACLS was initiated. Original rhythm was  V. fib. She got several rounds of epinephrine and had 3 defibrillations and bolus of amiodarone. Rosc achieved into an A. fib rhythm and then she went bradycardic and therefore she was externally paced and placed on a drip. On arrival she had palpable pulses with external pacing and was intubated with a King airway. Her airway is intact with bilateral breath sounds. IO in right tibia.  The epi drip was continued. She was given calcium and bicarbonate. blood pressure then went to 160s systolic for the external pacing was d/c. She had a palpable pulse with a bradycardic rhythm was difficult to determine whether it was sinus or A. fib. She was extubated from the [facility] airway and reintubated with an endotracheal tube. Chest x-ray shows ET tube to be in place. Chest x-ray is also concerning for possible pneumonia versus pulmonary edema versus for hemorrhage. She was a primary V. fib arrest, code was initiated. ICU and cardiology was consulted for admission. Labs notable for a lactic acid 7.8, troponin 0.09, and a white count of 27. CT head with no acute abnormality.  Patient still has a blood pressure within normal limits however is in bigeminy. This is possibly due to the epinephrine drip as it is a primary arrhythmic therefore we will slowly leaning the epinephrine as long as her blood pressure will tolerate. Cardiology and intensivist has seen the patient and will admit for further evaluation and management  Patient  care discussed and supervised by my attending, Dr. Erma Heritage. Azalia Bilis, MD   Final Clinical Impressions(s) / ED Diagnoses   Final diagnoses:  Required emergent intubation    New Prescriptions New Prescriptions   No medications on file     Cloie Wooden [place] Brice Potteiger, MD 07/28/16 0940    Shaune Pollack, MD 07/29/16 1300

## 2016-07-27 NOTE — Discharge Summary (Signed)
ELECTROPHYSIOLOGY PROCEDURE DISCHARGE SUMMARY    Patient ID: Nancy West,  MRN: [id number], DOB/AGE: 1945/07/14 71 y.o.  Admit date: 07/23/2016 Discharge date: 2016-08-13  Primary Care Physician: Lavonda Jumbo, MD Primary Cardiologist: Nahser Electrophysiologist: Allred  Primary Discharge Diagnosis:  1.  Persistent atrial fibrillation and atrial flutter status post Tikosyn loading this admission  Secondary Discharge Diagnosis:  1.  Morbid obesity 2.  Chronic diastolic heart failure 3.  OSA  Allergies  Allergen Reactions  . Augmentin [Amoxicillin-Pot Clavulanate] Other (See Comments)    Abdominal pain     Procedures This Admission:  1.  Tikosyn loading  Brief HPI/Hospital Course:  Nancy West is a 71 y.o. female with a past medical history as noted above.  She presented to the hospital with recurrent symptomatic atrial flutter.  Because of obesity and co-morbidities, she is not currently an ablation candidate.  Risks, benefits, and alternatives to Tikosyn were reviewed with the patient who wished to proceed.  The patient was admitted and Tikosyn was initiated.  Renal function and electrolytes were followed during the hospitalization.  Their QTc remained stable.  She converted to SR without need for DCCV They were monitored until discharge on telemetry which demonstrated sinus rhythm.  On the day of discharge, they were examined by Dr Elberta Fortis who considered them stable for discharge to home.  Follow-up has been arranged with the AF clinic in 1 week and with Dr Johney Frame in 4 weeks.   Physical Exam: Vitals:   07/26/16 1300 07/26/16 1938 08/13/16 0538 August 13, 2016 0850  BP: 130/62 140/79 (!) 148/61 (!) 139/54  Pulse: 70 72 67   Resp: 18 18 18 18   Temp: 98.1 F (36.7 C) 98.2 F (36.8 C) 98.1 F (36.7 C) 98 F (36.7 C)  TempSrc: Oral Oral Oral Oral  SpO2: 95% 97% 95% 97%  Weight:   (!) 380 lb 12.8 oz (172.7 kg)   Height:        GEN- The patient is morbidly obese  appearing, alert and oriented x 3 today.   HEENT: normocephalic, atraumatic; sclera clear, conjunctiva pink; hearing intact; oropharynx clear; neck supple Lungs- Clear to ausculation bilaterally, normal work of breathing.  No wheezes, rales, rhonchi Heart- Regular rate and rhythm, no murmurs, rubs or gallops  GI- soft, non-tender, non-distended, bowel sounds present  Extremities- no clubbing, cyanosis, or edema, chronic venous stasis changes MS- no significant deformity or atrophy Skin- warm and dry, no rash or lesion Psych- euthymic mood, full affect Neuro- strength and sensation are intact   Labs:   Lab Results  Component Value Date   WBC 12.2 (H) 07/23/2016   HGB 11.3 (L) 07/23/2016   HCT 37.7 07/23/2016   MCV 93.3 07/23/2016   PLT 248 07/23/2016    Recent Labs Lab 07/23/16 1745  2016-08-13 0430  NA 141  < > 144  K 5.8*  < > 4.2  CL 100*  < > 96*  CO2 28  < > 35*  BUN 25*  < > 29*  CREATININE 1.27*  < > 1.25*  CALCIUM 9.0  < > 9.2  PROT 6.9  --   --   BILITOT 1.4*  --   --   ALKPHOS 69  --   --   ALT 24  --   --   AST 51*  --   --   GLUCOSE 98  < > 102*  < > = values in this interval not displayed.   Discharge Medications:  Allergies as of 07/26/2016      Reactions   Augmentin [amoxicillin-pot Clavulanate] Other (See Comments)   Abdominal pain      Medication List    STOP taking these medications   aspirin 81 MG EC tablet     TAKE these medications   acetaminophen 650 MG CR tablet Commonly known as:  TYLENOL Take 650 mg by mouth every 8 (eight) hours as needed (for back pain).   Albuterol Sulfate 108 (90 Base) MCG/ACT Aepb Commonly known as:  PROAIR RESPICLICK Inhale 2 puffs into the lungs every 6 (six) hours as needed.   atorvastatin 40 MG tablet Commonly known as:  LIPITOR TAKE 1 TABLET(40 MG) BY MOUTH DAILY   benazepril 20 MG tablet Commonly known as:  LOTENSIN Take 1 tablet (20 mg total) by mouth daily.   diltiazem 300 MG 24 hr  capsule Commonly known as:  CARDIZEM CD Take 1 capsule (300 mg total) by mouth daily. Start taking on:  07/28/2016   dofetilide 500 MCG capsule Commonly known as:  TIKOSYN Take 1 capsule (500 mcg total) by mouth 2 (two) times daily.   FLUoxetine 20 MG capsule Commonly known as:  PROZAC Take 1 capsule (20 mg total) by mouth daily.   furosemide 40 MG tablet Commonly known as:  LASIX TAKE 1 TABLET BY MOUTH TWICE DAILY   levothyroxine 50 MCG tablet Commonly known as:  SYNTHROID, LEVOTHROID TAKE 1 TABLET(50 MCG) BY MOUTH DAILY   magnesium oxide 400 (241.3 Mg) MG tablet Commonly known as:  MAG-OX Take 1 tablet (400 mg total) by mouth 2 (two) times daily.   metoprolol tartrate 25 MG tablet Commonly known as:  LOPRESSOR Take 1 tablet (25 mg total) by mouth 2 (two) times daily.   mupirocin cream 2 % Commonly known as:  BACTROBAN Apply 1 application topically 2 (two) times daily. To affected areas near tailbone   omeprazole 20 MG capsule Commonly known as:  PRILOSEC Take 1 capsule (20 mg total) by mouth 2 (two) times daily.   ONE-A-DAY WOMENS 50+ ADVANTAGE Tabs Take 1 tablet by mouth daily after breakfast.   potassium chloride 10 MEQ tablet Commonly known as:  K-DUR,KLOR-CON Take 1 tablet (10 mEq total) by mouth daily.   rivaroxaban 20 MG Tabs tablet Commonly known as:  XARELTO Take 1 tablet (20 mg total) by mouth daily with supper.   SYMBICORT 160-4.5 MCG/ACT inhaler Generic drug:  budesonide-formoterol INHALE 2 PUFFS INTO THE LUNGS TWICE DAILY   Vitamin D 1000 units capsule Take 1,000 Units by mouth daily.       Disposition:  Discharge Instructions    Diet - low sodium heart healthy    Complete by:  As directed    Increase activity slowly    Complete by:  As directed      Follow-up Information    [facility] Follow up on 08/03/2016.   Specialty:  Cardiology Why:  1:45PM Contact information: [address] [id number] mc [place] [address] [phone]       Hillis Range, MD Follow up on 08/22/2016.   Specialty:  Cardiology Why:  12:00PM (noon) Contact information: [address] [place] [address] [phone]           Duration of Discharge Encounter: Greater than 30 minutes including physician time.  Signed, Gypsy Balsam, NP 07/31/2016 9:05 AM  I have seen and examined this patient with Gypsy Balsam.  Agree with above, note added to reflect  my findings.  On exam, regular rhythm, no murmurs, lungs clear. Noted fatigue is some loading. Converted to sinus rhythm after her third dose. QTc is remained stable. Plan for discharge today on 500 g a tedious twice a day with follow-up in A. fib clinic.    Lanisha Stepanian M. Eudora Guevarra MD 10-Aug-2016 9:15 AM

## 2016-07-27 NOTE — Progress Notes (Signed)
   08/22/2016 2302  Clinical Encounter Type  Visited With Patient  Visit Type Code;ED;Trauma  Referral From Other (Comment) (page)  Consult/Referral To Chaplain  Spiritual Encounters  Spiritual Needs Other (Comment) (ministry of presence)  Stress Factors  Patient Stress Factors None identified  Family Stress Factors None identified (no family present)  Pt. Code, cool code, no family, Trauma C.  Chaplain rendering a ministry of presence.  Chaplain Christianne Zacher A. Ashlon Lottman  [phone]  MA-PC, BA-REL/PHIL

## 2016-07-27 NOTE — ED Triage Notes (Signed)
Per EMS: Pt had witnessed arrest. Pt pulseless upon EMS arrival. Pt read a-fib, EMS shocked x 3. Pt given 300 amioderone. EtCO2 ~ 50. Pt on epi drip upon arrival. Last BP = 200/100. CBG = 294

## 2016-07-27 NOTE — Code Documentation (Signed)
Pacing ended per Erma Heritage, MD. Pt irregular pulse at 80.

## 2016-07-27 NOTE — Consult Note (Signed)
Cardiology Consult    Patient ID: [name] MRN: [id number], DOB/AGE: January 31, 1946   Admit date: 08/21/2016 Date of Consult: 08/15/2016  Primary Physician: Lavonda Jumbo, MD Reason for Consult: Post cardiac arrest Primary Cardiologist: Kristeen Miss Requesting Provider: Dr. Eudelia Bunch   History of Present Illness    Nancy West is a 71 year old female with a significant past medical history of atrial fibrillation/flutter, hypertension, type II diabetes, mild systolic heart failure, prior pericardial requiring pericardiocentesis, tobacco abuse, COPD with chronic hypoxia requiring supplemental oxygen, obstructive sleep apnea, morbid obesity, and hypothyroidism who was sent in today after being found down at home due to cardiac arrest and underlying rhythm being ventricular fibrillation.  Cardiology has been consulted for post cardiac arrest management and ventricular fibrillation.    Patient is currently intubated at this time and family that witnessed the event is not available.  History was taken from chart documentation and emergency physician.  Per documentation and report, patient was recently discharged home today after starting Tikosyn for atrial fibrillation.  It was stated that patient was eventually found down at home unresponsive with a grey appearance by family.  Emergency services was called to the home.  Per emergency medical services report, patient was noted to be in ventricular fibrillation.  She required a total of 3 shocks before she developed a normal spontaneous rhythm.  It sounds like patient had a brief episode of atrial fibrillation and then sinus bradycardia for which emergency medical services started transcutaneous pacing.  She was intubated prior to coming into the emergency room.  She was given IV amiodarone prior to being admitted and was placed on IV epinephrine.    Of note, patient was recently hospitalized after presenting with palpitations and noted to be in  atrial flutter.  Her rhythm did eventually progress to atrial fibrillation during her hospitalization.  It was decided after consultation with electrophysiology to start patient on Tikosyn during that hospialization.  Prior to discharge yesterday, her Qtc was in a safe range prior to discharge.    Past Medical History   Past Medical History:  Diagnosis Date  . Anemia    Noted 12/2011  . Atrial fib/flutter, transient    In setting of pericardial effusion 12/2011  . Chest pain    a. 12/2011 Neg Lexi MV, EF 56%.    . Diastolic CHF (HCC)   . GERD (gastroesophageal reflux disease)   . Hyperlipidemia   . Hypertension   . Hypothyroidism   . Obesity   . OSA (obstructive sleep apnea) 11/05/2013   noncompliant with CPAP  . Pericardial effusion    a. Large, s/p pericardiocentesis 01/02/12 yielding fluid (neg malignant cells, only inflammatory cells)  . Pneumonia 09/01/2013   "for the first time" (09/01/2013)  . Tobacco dependence    a. 46 pack yr hx.  . Type 2 diabetes, diet controlled (HCC)   . Venous stasis of lower extremity    a. chronic LEE    Past Surgical History:  Procedure Laterality Date  . APPENDECTOMY  ~ 2008  . BREAST BIOPSY Right 1980's   a. Benign   . CARDIOVERSION N/A 07/16/2016   Procedure: CARDIOVERSION;  Surgeon: Thurmon Fair, MD;  Location: [facility] ENDOSCOPY;  Service: Cardiovascular;  Laterality: N/A;  . CHOLECYSTECTOMY  1980's  . PERICARDIAL TAP N/A 01/02/2012   Procedure: PERICARDIAL TAP;  Surgeon: Kathleene Hazel, MD;  Location: [facility] CATH LAB;  Service: Cardiovascular;  Laterality: N/A;  . PERICARDIOCENTESIS  12/2011  yielding fluid (neg malignant cells, only inflammatory cells)/notes 12/30/2011  . TEE WITHOUT CARDIOVERSION N/A 07/16/2016   Procedure: TRANSESOPHAGEAL ECHOCARDIOGRAM (TEE);  Surgeon: Thurmon Fair, MD;  Location: [facility] ENDOSCOPY;  Service: Cardiovascular;  Laterality: N/A;     Allergies  Allergies  Allergen Reactions  . Augmentin  [Amoxicillin-Pot Clavulanate] Other (See Comments)    Abdominal pain    Inpatient Medications    . aspirin  300 mg Rectal NOW    Family History    Family History  Problem Relation Age of Onset  . Breast cancer Mother 10  . Hypertension Mother   . Arthritis Mother   . Anxiety disorder Mother   . Colon cancer Mother   . Diabetes Father     died in MVA  . Healthy Sister     Social History    Social History   Social History  . Marital status: Married    Spouse name: N/A  . Number of children: 0  . Years of education: N/A   Occupational History  . Retired (Presenter, broadcasting) [employer]   Social History Main Topics  . Smoking status: Former Smoker    Years: 50.00    Types: Cigarettes    Quit date: 12/16/2013  . Smokeless tobacco: Never Used  . Alcohol use No  . Drug use: No  . Sexual activity: No   Other Topics Concern  . Not on file   Social History Narrative   Lives in [place] with husband.  Quit smoking 11/2013 (when she was put on oxygen)   04/2015 husband had CABG, afib     Review of Systems    All other systems reviewed and are otherwise negative except as noted above.  Physical Exam    Blood pressure 151/74, pulse (!) 54, resp. rate 15, SpO2 (!) 89 %.  General: Patient is currently intubated and sedated.   Psych: Sedated Neuro: Alert and oriented X 3. Moves all extremities spontaneously. HEENT: Normal  Neck: Supple without bruits or JVD. Lungs:  Mechanical breath sounds, regular.   Heart: RRR no s3, s4, or murmurs. Abdomen: Soft, non-tender, non-distended, BS + x 4.  Extremities: 2+ pitting edema up to bilateral knees.  No clubbing or cyanosis.  Labs    Troponin [facility] Test)  Recent Labs  08/14/2016 2321  TROPIPOC 0.09*   No results for input(s): CKTOTAL, CKMB, TROPONINI in the last 72 hours. Lab Results  Component Value Date   WBC 27.4 (H) 08/05/2016   HGB 12.6 08/09/2016   HCT 37.0 07/31/2016   MCV 95.8 08/03/2016   PLT  207 08/05/2016    Recent Labs Lab 07/23/16 1745  07/30/2016 0430 07/26/2016 2324  NA 141  < > 144 143  K 5.8*  < > 4.2 4.2  CL 100*  < > 96* 98*  CO2 28  < > 35*  --   BUN 25*  < > 29* 39*  CREATININE 1.27*  < > 1.25* 1.60*  CALCIUM 9.0  < > 9.2  --   PROT 6.9  --   --   --   BILITOT 1.4*  --   --   --   ALKPHOS 69  --   --   --   ALT 24  --   --   --   AST 51*  --   --   --   GLUCOSE 98  < > 102* 314*  < > = values in this interval not displayed. Lab  Results  Component Value Date   CHOL 172 01/03/2016   HDL 64 01/03/2016   LDLCALC 88 01/03/2016   TRIG 101 01/03/2016   Lab Results  Component Value Date   DDIMER 2.31 (H) 07/14/2016     Radiology Studies    Dg Chest 2 View  Result Date: 07/14/2016 CLINICAL DATA:  Pt reports sharp back pain in upper right shoulder blade yesterday that "punched through pt" lasting about 5 minutes. Pt states since then, she has felt shaky and had cold sweats EXAM: CHEST  2 VIEW COMPARISON:  Radiograph 10/31/2015 FINDINGS: Stable enlarged cardiac silhouette. Central venous pulmonary congestion increased from prior. New focus of consolidation. No pleural fluid. IMPRESSION: Cardiomegaly and central venous congestion suggest mild congestive heart failure Electronically Signed   By: Genevive Bi M.D.   On: 07/14/2016 14:17   Ct Angio Chest Pe W Or Wo Contrast  Result Date: 07/14/2016 CLINICAL DATA:  Elevated D-dimer, chest pain. EXAM: CT ANGIOGRAPHY CHEST WITH CONTRAST TECHNIQUE: Multidetector CT imaging of the chest was performed using the standard protocol during bolus administration of intravenous contrast. Multiplanar CT image reconstructions and MIPs were obtained to evaluate the vascular anatomy. CONTRAST:  80 cc Isovue COMPARISON:  Chest radiograph 07/14/2016 FINDINGS: Cardiovascular: Exam is somewhat limited by patient body habitus. No filling defects within the proximal pulmonary arteries. The lower lobe pulmonary arteries are not well  evaluated. Mediastinum/Nodes: No axillary supraclavicular adenopathy. No mediastinal adenopathy. Esophagus normal. Lungs/Pleura: No pulmonary infarction. No consolidation. Mild interstitial thickening. Minimal atelectasis. No effusion. Upper Abdomen: Limited view of the liver, kidneys, pancreas are unremarkable. Normal adrenal glands. Musculoskeletal: No aggressive osseous lesion. Review of the MIP images confirms the above findings. IMPRESSION: 1. No evidence acute pulmonary embolism in suboptimal exam due to patient body habitus. 2. Mild interstitial edema. 3. No overt pulmonary edema, infarction or pneumonia. Electronically Signed   By: Genevive Bi M.D.   On: 07/14/2016 16:38   Dg Chest Port 1 View  Result Date: 08/12/2016 CLINICAL DATA:  Emergent intubation EXAM: PORTABLE CHEST 1 VIEW COMPARISON:  07/23/2016 FINDINGS: Endotracheal tube tip is approximately 1.6 cm above the carina. Confluent airspace opacity in the right upper lobe could be infectious. Pulmonary hemorrhage or aspiration are also possibilities. Left lung is grossly clear. No large pneumothorax. IMPRESSION: 1. Satisfactorily positioned ETT. 2. Consolidation in the right upper lobe, possibly pneumonia but aspiration or hemorrhage could also produce this appearance. Electronically Signed   By: Ellery Plunk M.D.   On: 08/21/2016 23:34   Dg Chest Portable 1 View  Result Date: 07/23/2016 CLINICAL DATA:  Shortness of breath, tachycardia EXAM: PORTABLE CHEST 1 VIEW COMPARISON:  CT chest 07/14/2014 FINDINGS: There is bilateral mild interstitial thickening. There is no focal parenchymal opacity. There is no pleural effusion or pneumothorax. There is stable cardiomegaly. The osseous structures are unremarkable. IMPRESSION: Cardiomegaly with mild pulmonary vascular congestion. Electronically Signed   By: Elige Ko   On: 07/23/2016 18:45    EKG & Cardiac Imaging    EKG: 08/04/2016 (22:58):  Sinus bradycardia; RBBB; ventricular  bigeminy  Echocardiogram 07/16/16:  - Left ventricle: The cavity size was normal. There was moderate   concentric hypertrophy. Systolic function was mildly reduced. The   estimated ejection fraction was in the range of 45% to 50%.   Diffuse hypokinesis. Features are consistent with a pseudonormal   left ventricular filling pattern, with concomitant abnormal   relaxation and increased filling pressure (grade 2 diastolic   dysfunction). Doppler parameters are  consistent with elevated   ventricular end-diastolic filling pressure. - Ventricular septum: The contour showed diastolic flattening and   systolic flattening. - Mitral valve: Calcified annulus. Mildly thickened leaflets .   There was mild regurgitation. - Left atrium: The atrium was mildly dilated. - Right ventricle: The cavity size was moderately dilated. Wall   thickness was normal. Systolic function was moderately reduced. - Right atrium: The atrium was moderately dilated. - Pulmonary arteries: Systolic pressure was severely increased. PA   peak pressure: 52 mm Hg (S).  Assessment & Plan    # Cardiac arrest: Patient with reported ventricular fibrillation in the field in the setting of recently starting Tikosyn for atrial fibrillation/flutter.  Based on the timing of events, cardiac arrest was likely caused by Tikosyn.  Troponin is mildly elevated but low suspicion that her cardiac arrest was caused by an acute myocardial infarction.  Magnesium is pending but other electrolytes are in normal range.  Patient is currently undergoing cooling protocol and epinephrine drip was started.  She is hemodynamically stable at this time.   - Consider weaning off epinephrine if heart rate is able to be maintained higher than 60 beats per minute. - No additional amiodarone at this time. - Agree with cooling protocol but please check head CT and coagulation labs.   - Will follow up magnesium.   - Hold home metoprolol - Check echocardiogram in the  morning.    # Atrial fibrillation/flutter: Patient with a known history of persistent atrial fibrillation/flutter.  Currently in normal sinus rhythm with 1st degree AV block at this time.  Patient was on Tikosyn prior to being admitted for rhythm control.   - No additional management at this time.  # Mild systolic heart failure: Patient with a known history of mild systolic heart failure thought secondary to atrial flutter/fibrillation per documentation.  Unable to completely assess for hemodynamic congestion, but patient does not appear to be in acute heart failure syndrome.   - Monitor daily weights - Hold furosemide at this time in the setting of acute renal failure and decreased fluid intake due to current state.    # Troponin elevation: Patient with mildly elevated troponin in the setting of post cardiac arrest.  Troponin elevation is likely from a type II (demand ischemia) in the setting of ventricular fibrillation arrest.  ECG is without any specific signs of myocardial ischemia/infarction. - No additional management at this time. - Please check another troponin level for overall trend monitoring.     Signed, Judie Grieve, MD 07/30/2016, 11:55 PM

## 2016-07-27 NOTE — Progress Notes (Signed)
Dr Elberta Fortis reviewed EKG prior to discharge. She has PVC's that pre-date Tikosyn initiation. Ok to send home on current Tikosyn dose per Dr Elberta Fortis. Will get EKG Monday in AF clinic. Appt scheduled  Gypsy Balsam, NP 08/13/16 1:25 PM

## 2016-07-27 NOTE — ED Notes (Signed)
Dr Eudelia Bunch given a copy of troponin results .09

## 2016-07-27 NOTE — Code Documentation (Signed)
Ice packs placed to groin.

## 2016-07-27 NOTE — Progress Notes (Signed)
Patient in a stable  condition, discharged education completed , she verbalised understanding, iv removed , tele dc ,ccmd notified, patient belongings at bedside, awaiting tikosyn from pharmacy

## 2016-07-27 NOTE — Code Documentation (Signed)
Pt being paced at , 80 bpm.

## 2016-07-28 ENCOUNTER — Inpatient Hospital Stay (HOSPITAL_COMMUNITY): Payer: Medicare Other

## 2016-07-28 ENCOUNTER — Emergency Department (HOSPITAL_COMMUNITY): Payer: Medicare Other

## 2016-07-28 DIAGNOSIS — D696 Thrombocytopenia, unspecified: Secondary | ICD-10-CM | POA: Diagnosis present

## 2016-07-28 DIAGNOSIS — I469 Cardiac arrest, cause unspecified: Secondary | ICD-10-CM | POA: Diagnosis present

## 2016-07-28 DIAGNOSIS — I5042 Chronic combined systolic (congestive) and diastolic (congestive) heart failure: Secondary | ICD-10-CM | POA: Diagnosis present

## 2016-07-28 DIAGNOSIS — R748 Abnormal levels of other serum enzymes: Secondary | ICD-10-CM | POA: Diagnosis not present

## 2016-07-28 DIAGNOSIS — I248 Other forms of acute ischemic heart disease: Secondary | ICD-10-CM | POA: Diagnosis present

## 2016-07-28 DIAGNOSIS — E039 Hypothyroidism, unspecified: Secondary | ICD-10-CM | POA: Diagnosis present

## 2016-07-28 DIAGNOSIS — I4892 Unspecified atrial flutter: Secondary | ICD-10-CM | POA: Diagnosis present

## 2016-07-28 DIAGNOSIS — R7989 Other specified abnormal findings of blood chemistry: Secondary | ICD-10-CM

## 2016-07-28 DIAGNOSIS — E662 Morbid (severe) obesity with alveolar hypoventilation: Secondary | ICD-10-CM | POA: Diagnosis present

## 2016-07-28 DIAGNOSIS — Z6841 Body Mass Index (BMI) 40.0 and over, adult: Secondary | ICD-10-CM | POA: Diagnosis not present

## 2016-07-28 DIAGNOSIS — I313 Pericardial effusion (noninflammatory): Secondary | ICD-10-CM | POA: Diagnosis present

## 2016-07-28 DIAGNOSIS — I480 Paroxysmal atrial fibrillation: Secondary | ICD-10-CM | POA: Diagnosis not present

## 2016-07-28 DIAGNOSIS — I4901 Ventricular fibrillation: Secondary | ICD-10-CM | POA: Insufficient documentation

## 2016-07-28 DIAGNOSIS — K219 Gastro-esophageal reflux disease without esophagitis: Secondary | ICD-10-CM | POA: Diagnosis present

## 2016-07-28 DIAGNOSIS — R402212 Coma scale, best verbal response, none, at arrival to emergency department: Secondary | ICD-10-CM | POA: Diagnosis present

## 2016-07-28 DIAGNOSIS — J690 Pneumonitis due to inhalation of food and vomit: Secondary | ICD-10-CM | POA: Diagnosis not present

## 2016-07-28 DIAGNOSIS — R402112 Coma scale, eyes open, never, at arrival to emergency department: Secondary | ICD-10-CM | POA: Diagnosis present

## 2016-07-28 DIAGNOSIS — R402312 Coma scale, best motor response, none, at arrival to emergency department: Secondary | ICD-10-CM | POA: Diagnosis present

## 2016-07-28 DIAGNOSIS — G9340 Encephalopathy, unspecified: Secondary | ICD-10-CM | POA: Diagnosis not present

## 2016-07-28 DIAGNOSIS — E1122 Type 2 diabetes mellitus with diabetic chronic kidney disease: Secondary | ICD-10-CM | POA: Diagnosis present

## 2016-07-28 DIAGNOSIS — R9431 Abnormal electrocardiogram [ECG] [EKG]: Secondary | ICD-10-CM

## 2016-07-28 DIAGNOSIS — J80 Acute respiratory distress syndrome: Secondary | ICD-10-CM | POA: Diagnosis present

## 2016-07-28 DIAGNOSIS — I959 Hypotension, unspecified: Secondary | ICD-10-CM | POA: Diagnosis present

## 2016-07-28 DIAGNOSIS — R778 Other specified abnormalities of plasma proteins: Secondary | ICD-10-CM | POA: Insufficient documentation

## 2016-07-28 DIAGNOSIS — I130 Hypertensive heart and chronic kidney disease with heart failure and stage 1 through stage 4 chronic kidney disease, or unspecified chronic kidney disease: Secondary | ICD-10-CM | POA: Diagnosis present

## 2016-07-28 DIAGNOSIS — R570 Cardiogenic shock: Secondary | ICD-10-CM | POA: Diagnosis present

## 2016-07-28 DIAGNOSIS — J9690 Respiratory failure, unspecified, unspecified whether with hypoxia or hypercapnia: Secondary | ICD-10-CM | POA: Diagnosis not present

## 2016-07-28 DIAGNOSIS — I429 Cardiomyopathy, unspecified: Secondary | ICD-10-CM | POA: Diagnosis present

## 2016-07-28 DIAGNOSIS — S0003XA Contusion of scalp, initial encounter: Secondary | ICD-10-CM | POA: Diagnosis not present

## 2016-07-28 DIAGNOSIS — R0902 Hypoxemia: Secondary | ICD-10-CM | POA: Diagnosis not present

## 2016-07-28 DIAGNOSIS — J9601 Acute respiratory failure with hypoxia: Secondary | ICD-10-CM | POA: Diagnosis not present

## 2016-07-28 DIAGNOSIS — G931 Anoxic brain damage, not elsewhere classified: Secondary | ICD-10-CM | POA: Diagnosis present

## 2016-07-28 DIAGNOSIS — E874 Mixed disorder of acid-base balance: Secondary | ICD-10-CM | POA: Diagnosis not present

## 2016-07-28 DIAGNOSIS — Z4682 Encounter for fitting and adjustment of non-vascular catheter: Secondary | ICD-10-CM | POA: Diagnosis not present

## 2016-07-28 DIAGNOSIS — Z452 Encounter for adjustment and management of vascular access device: Secondary | ICD-10-CM | POA: Diagnosis not present

## 2016-07-28 DIAGNOSIS — N179 Acute kidney failure, unspecified: Secondary | ICD-10-CM | POA: Diagnosis present

## 2016-07-28 DIAGNOSIS — J189 Pneumonia, unspecified organism: Secondary | ICD-10-CM | POA: Diagnosis not present

## 2016-07-28 DIAGNOSIS — I249 Acute ischemic heart disease, unspecified: Secondary | ICD-10-CM | POA: Diagnosis not present

## 2016-07-28 LAB — URINALYSIS, ROUTINE W REFLEX MICROSCOPIC
BILIRUBIN URINE: NEGATIVE
Glucose, UA: 150 mg/dL — AB
KETONES UR: NEGATIVE mg/dL
Leukocytes, UA: NEGATIVE
Nitrite: NEGATIVE
Protein, ur: 100 mg/dL — AB
Specific Gravity, Urine: 1.01 (ref 1.005–1.030)
pH: 5 (ref 5.0–8.0)

## 2016-07-28 LAB — POCT I-STAT, CHEM 8
BUN: 32 mg/dL — AB (ref 6–20)
BUN: 34 mg/dL — ABNORMAL HIGH (ref 6–20)
BUN: 34 mg/dL — ABNORMAL HIGH (ref 6–20)
BUN: 35 mg/dL — ABNORMAL HIGH (ref 6–20)
BUN: 34 mg/dL — ABNORMAL HIGH (ref 6–20)
BUN: 35 mg/dL — AB (ref 6–20)
BUN: 35 mg/dL — ABNORMAL HIGH (ref 6–20)
CALCIUM ION: 1.12 mmol/L — AB (ref 1.15–1.40)
CALCIUM ION: 1.1 mmol/L — AB (ref 1.15–1.40)
CALCIUM ION: 1.09 mmol/L — AB (ref 1.15–1.40)
CALCIUM ION: 1.09 mmol/L — AB (ref 1.15–1.40)
CHLORIDE: 99 mmol/L — AB (ref 101–111)
CREATININE: 1.6 mg/dL — AB (ref 0.44–1.00)
CREATININE: 1.7 mg/dL — AB (ref 0.44–1.00)
CREATININE: 1.7 mg/dL — AB (ref 0.44–1.00)
Calcium, Ion: 1.06 mmol/L — ABNORMAL LOW (ref 1.15–1.40)
Calcium, Ion: 1.12 mmol/L — ABNORMAL LOW (ref 1.15–1.40)
Calcium, Ion: 1.09 mmol/L — ABNORMAL LOW (ref 1.15–1.40)
Chloride: 101 mmol/L (ref 101–111)
Chloride: 99 mmol/L — ABNORMAL LOW (ref 101–111)
Chloride: 99 mmol/L — ABNORMAL LOW (ref 101–111)
Chloride: 99 mmol/L — ABNORMAL LOW (ref 101–111)
Chloride: 100 mmol/L — ABNORMAL LOW (ref 101–111)
Chloride: 101 mmol/L (ref 101–111)
Creatinine, Ser: 1.7 mg/dL — ABNORMAL HIGH (ref 0.44–1.00)
Creatinine, Ser: 1.7 mg/dL — ABNORMAL HIGH (ref 0.44–1.00)
Creatinine, Ser: 1.6 mg/dL — ABNORMAL HIGH (ref 0.44–1.00)
Creatinine, Ser: 1.8 mg/dL — ABNORMAL HIGH (ref 0.44–1.00)
GLUCOSE: 405 mg/dL — AB (ref 65–99)
GLUCOSE: 408 mg/dL — AB (ref 65–99)
GLUCOSE: 392 mg/dL — AB (ref 65–99)
Glucose, Bld: 342 mg/dL — ABNORMAL HIGH (ref 65–99)
Glucose, Bld: 387 mg/dL — ABNORMAL HIGH (ref 65–99)
Glucose, Bld: 411 mg/dL — ABNORMAL HIGH (ref 65–99)
Glucose, Bld: 253 mg/dL — ABNORMAL HIGH (ref 65–99)
HCT: 33 % — ABNORMAL LOW (ref 36.0–46.0)
HCT: 33 % — ABNORMAL LOW (ref 36.0–46.0)
HCT: 32 % — ABNORMAL LOW (ref 36.0–46.0)
HEMATOCRIT: 29 % — AB (ref 36.0–46.0)
HEMATOCRIT: 32 % — AB (ref 36.0–46.0)
HEMATOCRIT: 32 % — AB (ref 36.0–46.0)
HEMATOCRIT: 32 % — AB (ref 36.0–46.0)
HEMOGLOBIN: 9.9 g/dL — AB (ref 12.0–15.0)
HEMOGLOBIN: 10.9 g/dL — AB (ref 12.0–15.0)
HEMOGLOBIN: 11.2 g/dL — AB (ref 12.0–15.0)
HEMOGLOBIN: 10.9 g/dL — AB (ref 12.0–15.0)
HEMOGLOBIN: 10.9 g/dL — AB (ref 12.0–15.0)
Hemoglobin: 11.2 g/dL — ABNORMAL LOW (ref 12.0–15.0)
Hemoglobin: 10.9 g/dL — ABNORMAL LOW (ref 12.0–15.0)
POTASSIUM: 3.2 mmol/L — AB (ref 3.5–5.1)
Potassium: 3.3 mmol/L — ABNORMAL LOW (ref 3.5–5.1)
Potassium: 3.2 mmol/L — ABNORMAL LOW (ref 3.5–5.1)
Potassium: 3.1 mmol/L — ABNORMAL LOW (ref 3.5–5.1)
Potassium: 3.1 mmol/L — ABNORMAL LOW (ref 3.5–5.1)
Potassium: 3.2 mmol/L — ABNORMAL LOW (ref 3.5–5.1)
Potassium: 3.1 mmol/L — ABNORMAL LOW (ref 3.5–5.1)
SODIUM: 143 mmol/L (ref 135–145)
SODIUM: 144 mmol/L (ref 135–145)
SODIUM: 142 mmol/L (ref 135–145)
SODIUM: 142 mmol/L (ref 135–145)
SODIUM: 142 mmol/L (ref 135–145)
Sodium: 144 mmol/L (ref 135–145)
Sodium: 141 mmol/L (ref 135–145)
TCO2: 30 mmol/L (ref 0–100)
TCO2: 28 mmol/L (ref 0–100)
TCO2: 25 mmol/L (ref 0–100)
TCO2: 25 mmol/L (ref 0–100)
TCO2: 26 mmol/L (ref 0–100)
TCO2: 25 mmol/L (ref 0–100)
TCO2: 26 mmol/L (ref 0–100)

## 2016-07-28 LAB — BLOOD GAS, ARTERIAL
Acid-base deficit: 2.7 mmol/L — ABNORMAL HIGH (ref 0.0–2.0)
BICARBONATE: 24 mmol/L (ref 20.0–28.0)
DRAWN BY: 44956
FIO2: 100
LHR: 26 {breaths}/min
MECHVT: 510 mL
O2 Saturation: 99.1 %
PEEP: 14 cmH2O
PO2 ART: 208 mmHg — AB (ref 83.0–108.0)
Patient temperature: 90.5
pCO2 arterial: 49 mmHg — ABNORMAL HIGH (ref 32.0–48.0)
pH, Arterial: 7.28 — ABNORMAL LOW (ref 7.350–7.450)

## 2016-07-28 LAB — BASIC METABOLIC PANEL
ANION GAP: 11 (ref 5–15)
ANION GAP: 16 — AB (ref 5–15)
BUN: 28 mg/dL — ABNORMAL HIGH (ref 6–20)
BUN: 34 mg/dL — ABNORMAL HIGH (ref 6–20)
CALCIUM: 8.4 mg/dL — AB (ref 8.9–10.3)
CHLORIDE: 102 mmol/L (ref 101–111)
CHLORIDE: 110 mmol/L (ref 101–111)
CO2: 24 mmol/L (ref 22–32)
CO2: 24 mmol/L (ref 22–32)
Calcium: 7.2 mg/dL — ABNORMAL LOW (ref 8.9–10.3)
Creatinine, Ser: 1.35 mg/dL — ABNORMAL HIGH (ref 0.44–1.00)
Creatinine, Ser: 1.78 mg/dL — ABNORMAL HIGH (ref 0.44–1.00)
GFR calc non Af Amer: 39 mL/min — ABNORMAL LOW (ref >60)
GFR calc non Af Amer: 28 mL/min — ABNORMAL LOW (ref >60)
GFR, EST AFRICAN AMERICAN: 33 mL/min — AB (ref >60)
GFR, EST AFRICAN AMERICAN: 46 mL/min — AB (ref >60)
GLUCOSE: 295 mg/dL — AB (ref 65–99)
Glucose, Bld: 380 mg/dL — ABNORMAL HIGH (ref 65–99)
POTASSIUM: 2.8 mmol/L — AB (ref 3.5–5.1)
POTASSIUM: 3.2 mmol/L — AB (ref 3.5–5.1)
Sodium: 145 mmol/L (ref 135–145)
Sodium: 142 mmol/L (ref 135–145)

## 2016-07-28 LAB — COMPREHENSIVE METABOLIC PANEL
ALBUMIN: 3.5 g/dL (ref 3.5–5.0)
ALK PHOS: 80 U/L (ref 38–126)
ALT: 27 U/L (ref 14–54)
AST: 45 U/L — AB (ref 15–41)
Anion gap: 21 — ABNORMAL HIGH (ref 5–15)
BILIRUBIN TOTAL: 0.7 mg/dL (ref 0.3–1.2)
BUN: 33 mg/dL — AB (ref 6–20)
CALCIUM: 9.9 mg/dL (ref 8.9–10.3)
CO2: 24 mmol/L (ref 22–32)
CREATININE: 1.84 mg/dL — AB (ref 0.44–1.00)
Chloride: 97 mmol/L — ABNORMAL LOW (ref 101–111)
GFR calc Af Amer: 31 mL/min — ABNORMAL LOW (ref >60)
GFR calc non Af Amer: 27 mL/min — ABNORMAL LOW (ref >60)
GLUCOSE: 314 mg/dL — AB (ref 65–99)
Potassium: 4.4 mmol/L (ref 3.5–5.1)
Sodium: 142 mmol/L (ref 135–145)
TOTAL PROTEIN: 6.8 g/dL (ref 6.5–8.1)

## 2016-07-28 LAB — I-STAT ARTERIAL BLOOD GAS, ED
ACID-BASE EXCESS: 1 mmol/L (ref 0.0–2.0)
Bicarbonate: 30.9 mmol/L — ABNORMAL HIGH (ref 20.0–28.0)
O2 SAT: 87 %
PH ART: 7.198 — AB (ref 7.350–7.450)
Patient temperature: 95.1
TCO2: 33 mmol/L (ref 0–100)
pCO2 arterial: 77.4 mmHg (ref 32.0–48.0)
pO2, Arterial: 61 mmHg — ABNORMAL LOW (ref 83.0–108.0)

## 2016-07-28 LAB — RAPID URINE DRUG SCREEN, HOSP PERFORMED
AMPHETAMINES: NOT DETECTED
BARBITURATES: NOT DETECTED
BENZODIAZEPINES: NOT DETECTED
COCAINE: NOT DETECTED
Opiates: NOT DETECTED
TETRAHYDROCANNABINOL: NOT DETECTED

## 2016-07-28 LAB — GLUCOSE, CAPILLARY
GLUCOSE-CAPILLARY: 345 mg/dL — AB (ref 65–99)
GLUCOSE-CAPILLARY: 360 mg/dL — AB (ref 65–99)
GLUCOSE-CAPILLARY: 368 mg/dL — AB (ref 65–99)
GLUCOSE-CAPILLARY: 366 mg/dL — AB (ref 65–99)
GLUCOSE-CAPILLARY: 351 mg/dL — AB (ref 65–99)
GLUCOSE-CAPILLARY: 357 mg/dL — AB (ref 65–99)
GLUCOSE-CAPILLARY: 250 mg/dL — AB (ref 65–99)
GLUCOSE-CAPILLARY: 195 mg/dL — AB (ref 65–99)
GLUCOSE-CAPILLARY: 209 mg/dL — AB (ref 65–99)
Glucose-Capillary: 305 mg/dL — ABNORMAL HIGH (ref 65–99)
Glucose-Capillary: 388 mg/dL — ABNORMAL HIGH (ref 65–99)
Glucose-Capillary: 359 mg/dL — ABNORMAL HIGH (ref 65–99)
Glucose-Capillary: 336 mg/dL — ABNORMAL HIGH (ref 65–99)
Glucose-Capillary: 359 mg/dL — ABNORMAL HIGH (ref 65–99)
Glucose-Capillary: 343 mg/dL — ABNORMAL HIGH (ref 65–99)
Glucose-Capillary: 284 mg/dL — ABNORMAL HIGH (ref 65–99)
Glucose-Capillary: 313 mg/dL — ABNORMAL HIGH (ref 65–99)
Glucose-Capillary: 144 mg/dL — ABNORMAL HIGH (ref 65–99)
Glucose-Capillary: 103 mg/dL — ABNORMAL HIGH (ref 65–99)

## 2016-07-28 LAB — PROTIME-INR
INR: 2.3
INR: 2.59
PROTHROMBIN TIME: 25.7 s — AB (ref 11.4–15.2)
Prothrombin Time: 28.3 seconds — ABNORMAL HIGH (ref 11.4–15.2)

## 2016-07-28 LAB — CBC
HEMATOCRIT: 34.3 % — AB (ref 36.0–46.0)
Hemoglobin: 10.2 g/dL — ABNORMAL LOW (ref 12.0–15.0)
MCH: 28.2 pg (ref 26.0–34.0)
MCHC: 29.7 g/dL — AB (ref 30.0–36.0)
MCV: 94.8 fL (ref 78.0–100.0)
PLATELETS: 233 10*3/uL (ref 150–400)
RBC: 3.62 MIL/uL — ABNORMAL LOW (ref 3.87–5.11)
RDW: 14.7 % (ref 11.5–15.5)
WBC: 32.7 10*3/uL — ABNORMAL HIGH (ref 4.0–10.5)

## 2016-07-28 LAB — PHOSPHORUS
Phosphorus: 6.9 mg/dL — ABNORMAL HIGH (ref 2.5–4.6)
Phosphorus: 5.3 mg/dL — ABNORMAL HIGH (ref 2.5–4.6)

## 2016-07-28 LAB — MAGNESIUM
MAGNESIUM: 1.8 mg/dL (ref 1.7–2.4)
Magnesium: 2 mg/dL (ref 1.7–2.4)
Magnesium: 1.4 mg/dL — ABNORMAL LOW (ref 1.7–2.4)

## 2016-07-28 LAB — CBC WITH DIFFERENTIAL/PLATELET
BASOS ABS: 0 10*3/uL (ref 0.0–0.1)
Basophils Relative: 0 %
Eosinophils Absolute: 0 10*3/uL (ref 0.0–0.7)
Eosinophils Relative: 0 %
HCT: 36.8 % (ref 36.0–46.0)
HEMOGLOBIN: 11 g/dL — AB (ref 12.0–15.0)
LYMPHS ABS: 3.8 10*3/uL (ref 0.7–4.0)
Lymphocytes Relative: 14 %
MCH: 28.6 pg (ref 26.0–34.0)
MCHC: 29.9 g/dL — AB (ref 30.0–36.0)
MCV: 95.8 fL (ref 78.0–100.0)
MONOS PCT: 3 %
Monocytes Absolute: 0.8 10*3/uL (ref 0.1–1.0)
NEUTROS ABS: 22.8 10*3/uL — AB (ref 1.7–7.7)
Neutrophils Relative %: 83 %
PLATELETS: 207 10*3/uL (ref 150–400)
RBC: 3.84 MIL/uL — ABNORMAL LOW (ref 3.87–5.11)
RDW: 15.1 % (ref 11.5–15.5)
WBC: 27.4 10*3/uL — ABNORMAL HIGH (ref 4.0–10.5)

## 2016-07-28 LAB — TROPONIN I
TROPONIN I: 0.64 ng/mL — AB (ref <0.03)
Troponin I: 0.34 ng/mL (ref <0.03)
Troponin I: 0.35 ng/mL (ref <0.03)
Troponin I: 0.24 ng/mL (ref <0.03)

## 2016-07-28 LAB — MRSA PCR SCREENING: MRSA by PCR: NEGATIVE

## 2016-07-28 LAB — HEPARIN LEVEL (UNFRACTIONATED)

## 2016-07-28 LAB — APTT
APTT: 36 s (ref 24–36)
aPTT: 37 seconds — ABNORMAL HIGH (ref 24–36)

## 2016-07-28 MED ORDER — SODIUM CHLORIDE 0.9 % IV SOLN
1.0000 ug/kg/min | INTRAVENOUS | Status: DC
  Administered 2016-07-28 – 2016-07-29 (×4): 1.5 ug/kg/min via INTRAVENOUS
  Filled 2016-07-28 (×4): qty 20

## 2016-07-28 MED ORDER — SODIUM CHLORIDE 0.9 % IV SOLN
30.0000 meq | Freq: Once | INTRAVENOUS | Status: AC
  Administered 2016-07-28: 30 meq via INTRAVENOUS
  Filled 2016-07-28: qty 15

## 2016-07-28 MED ORDER — ARTIFICIAL TEARS OP OINT
1.0000 "application " | TOPICAL_OINTMENT | Freq: Three times a day (TID) | OPHTHALMIC | Status: DC
  Administered 2016-07-28 – 2016-07-30 (×7): 1 via OPHTHALMIC
  Filled 2016-07-28 (×2): qty 3.5

## 2016-07-28 MED ORDER — PIPERACILLIN-TAZOBACTAM 3.375 G IVPB
3.3750 g | Freq: Three times a day (TID) | INTRAVENOUS | Status: DC
  Administered 2016-07-28 – 2016-08-01 (×14): 3.375 g via INTRAVENOUS
  Filled 2016-07-28 (×15): qty 50

## 2016-07-28 MED ORDER — SODIUM CHLORIDE 0.9 % IV SOLN
INTRAVENOUS | Status: DC
  Administered 2016-07-28: 05:00:00 via INTRAVENOUS
  Administered 2016-07-28 – 2016-07-29 (×2): 100 mL/h via INTRAVENOUS
  Administered 2016-07-29 – 2016-07-30 (×2): via INTRAVENOUS

## 2016-07-28 MED ORDER — ORAL CARE MOUTH RINSE
15.0000 mL | OROMUCOSAL | Status: DC
  Administered 2016-07-28 – 2016-08-01 (×42): 15 mL via OROMUCOSAL

## 2016-07-28 MED ORDER — POTASSIUM CHLORIDE 2 MEQ/ML IV SOLN
30.0000 meq | Freq: Once | INTRAVENOUS | Status: AC
  Administered 2016-07-28: 30 meq via INTRAVENOUS
  Filled 2016-07-28: qty 15

## 2016-07-28 MED ORDER — CISATRACURIUM BOLUS VIA INFUSION
0.0500 mg/kg | INTRAVENOUS | Status: DC | PRN
  Filled 2016-07-28: qty 9

## 2016-07-28 MED ORDER — CHLORHEXIDINE GLUCONATE CLOTH 2 % EX PADS
6.0000 | MEDICATED_PAD | Freq: Every day | CUTANEOUS | Status: DC
  Administered 2016-07-28 – 2016-08-01 (×6): 6 via TOPICAL

## 2016-07-28 MED ORDER — HEPARIN (PORCINE) IN NACL 100-0.45 UNIT/ML-% IJ SOLN
1050.0000 [IU]/h | INTRAMUSCULAR | Status: DC
  Administered 2016-07-28: 900 [IU]/h via INTRAVENOUS
  Filled 2016-07-28: qty 250

## 2016-07-28 MED ORDER — DEXTROSE 5 % IV SOLN: 2.0000 g | Freq: Once | INTRAVENOUS | Status: DC

## 2016-07-28 MED ORDER — NOREPINEPHRINE BITARTRATE 1 MG/ML IV SOLN
0.0000 ug/min | INTRAVENOUS | Status: DC
  Administered 2016-07-28: 42 ug/min via INTRAVENOUS
  Administered 2016-07-28: 38 ug/min via INTRAVENOUS
  Administered 2016-07-28: 5 ug/min via INTRAVENOUS
  Administered 2016-07-29: 50 ug/min via INTRAVENOUS
  Administered 2016-07-29: 27 ug/min via INTRAVENOUS
  Administered 2016-07-30: 15 ug/min via INTRAVENOUS
  Administered 2016-07-30: 30 ug/min via INTRAVENOUS
  Administered 2016-08-01: 40 ug/min via INTRAVENOUS
  Filled 2016-07-28 (×11): qty 16

## 2016-07-28 MED ORDER — CISATRACURIUM BOLUS VIA INFUSION
0.1000 mg/kg | Freq: Once | INTRAVENOUS | Status: AC
  Administered 2016-07-28: 17.3 mg via INTRAVENOUS
  Filled 2016-07-28: qty 18

## 2016-07-28 MED ORDER — DEXTROSE 50 % IV SOLN
INTRAVENOUS | Status: AC
  Administered 2016-07-28: 17 mL
  Filled 2016-07-28: qty 50

## 2016-07-28 MED ORDER — VANCOMYCIN HCL 10 G IV SOLR
1750.0000 mg | INTRAVENOUS | Status: DC
  Administered 2016-07-29 – 2016-07-31 (×3): 1750 mg via INTRAVENOUS
  Filled 2016-07-28 (×4): qty 1750

## 2016-07-28 MED ORDER — ASPIRIN 300 MG RE SUPP: 300.0000 mg | RECTAL | Status: DC

## 2016-07-28 MED ORDER — MAGNESIUM SULFATE 2 GM/50ML IV SOLN
2.0000 g | Freq: Once | INTRAVENOUS | Status: AC
  Administered 2016-07-28: 2 g via INTRAVENOUS
  Filled 2016-07-28: qty 50

## 2016-07-28 MED ORDER — ASPIRIN 325 MG PO TABS
325.0000 mg | ORAL_TABLET | Freq: Once | ORAL | Status: AC
  Administered 2016-07-28: 325 mg
  Filled 2016-07-28: qty 1

## 2016-07-28 MED ORDER — FENTANYL BOLUS VIA INFUSION
25.0000 ug | INTRAVENOUS | Status: DC | PRN
  Filled 2016-07-28: qty 25

## 2016-07-28 MED ORDER — SODIUM CHLORIDE 0.9% FLUSH
10.0000 mL | Freq: Two times a day (BID) | INTRAVENOUS | Status: DC
  Administered 2016-07-28 – 2016-08-01 (×6): 10 mL

## 2016-07-28 MED ORDER — SODIUM CHLORIDE 0.9 % IV SOLN
INTRAVENOUS | Status: DC
  Administered 2016-07-28: 2.9 [IU]/h via INTRAVENOUS
  Administered 2016-07-28: 32.9 [IU]/h via INTRAVENOUS
  Filled 2016-07-28 (×2): qty 2.5

## 2016-07-28 MED ORDER — VANCOMYCIN HCL 10 G IV SOLR
2000.0000 mg | Freq: Once | INTRAVENOUS | Status: AC
  Administered 2016-07-28: 2000 mg via INTRAVENOUS
  Filled 2016-07-28: qty 2000

## 2016-07-28 MED ORDER — SODIUM CHLORIDE 0.9 % IV SOLN
0.0000 ug/h | INTRAVENOUS | Status: DC
  Administered 2016-07-28 – 2016-07-30 (×3): 100 ug/h via INTRAVENOUS
  Filled 2016-07-28 (×3): qty 50

## 2016-07-28 MED ORDER — CHLORHEXIDINE GLUCONATE 0.12% ORAL RINSE (MEDLINE KIT)
15.0000 mL | Freq: Two times a day (BID) | OROMUCOSAL | Status: DC
  Administered 2016-07-28 – 2016-08-01 (×9): 15 mL via OROMUCOSAL

## 2016-07-28 MED ORDER — NOREPINEPHRINE BITARTRATE 1 MG/ML IV SOLN
0.0000 ug/min | INTRAVENOUS | Status: DC
  Filled 2016-07-28 (×2): qty 4

## 2016-07-28 MED ORDER — SODIUM CHLORIDE 0.9% FLUSH: 10.0000 mL | INTRAVENOUS | Status: DC | PRN

## 2016-07-28 MED ORDER — PANTOPRAZOLE SODIUM 40 MG IV SOLR
40.0000 mg | Freq: Every day | INTRAVENOUS | Status: DC
  Administered 2016-07-28 – 2016-07-31 (×4): 40 mg via INTRAVENOUS
  Filled 2016-07-28 (×4): qty 40

## 2016-07-28 MED ORDER — INSULIN GLARGINE 100 UNIT/ML ~~LOC~~ SOLN
15.0000 [IU] | SUBCUTANEOUS | Status: DC
  Administered 2016-07-29: 15 [IU] via SUBCUTANEOUS
  Filled 2016-07-28: qty 0.15

## 2016-07-28 MED ORDER — PROPOFOL 1000 MG/100ML IV EMUL
0.0000 ug/kg/min | INTRAVENOUS | Status: DC
  Administered 2016-07-28 (×2): 25 ug/kg/min via INTRAVENOUS
  Administered 2016-07-28: 15 ug/kg/min via INTRAVENOUS
  Administered 2016-07-28 – 2016-07-29 (×3): 25 ug/kg/min via INTRAVENOUS
  Administered 2016-07-29: 35 ug/kg/min via INTRAVENOUS
  Administered 2016-07-29: 50 ug/kg/min via INTRAVENOUS
  Administered 2016-07-29 (×4): 25 ug/kg/min via INTRAVENOUS
  Administered 2016-07-30 (×3): 30 ug/kg/min via INTRAVENOUS
  Administered 2016-07-30 (×3): 50 ug/kg/min via INTRAVENOUS
  Administered 2016-07-30: 35 ug/kg/min via INTRAVENOUS
  Administered 2016-07-30: 50 ug/kg/min via INTRAVENOUS
  Administered 2016-07-31 – 2016-08-01 (×8): 30 ug/kg/min via INTRAVENOUS
  Administered 2016-08-01: 20 ug/kg/min via INTRAVENOUS
  Administered 2016-08-01: 30 ug/kg/min via INTRAVENOUS
  Filled 2016-07-28 (×6): qty 100
  Filled 2016-07-28 (×2): qty 200
  Filled 2016-07-28: qty 100
  Filled 2016-07-28: qty 200
  Filled 2016-07-28 (×6): qty 100
  Filled 2016-07-28 (×3): qty 200
  Filled 2016-07-28: qty 100
  Filled 2016-07-28: qty 200
  Filled 2016-07-28 (×4): qty 100
  Filled 2016-07-28: qty 300
  Filled 2016-07-28: qty 200
  Filled 2016-07-28 (×2): qty 100

## 2016-07-28 MED ORDER — INSULIN ASPART 100 UNIT/ML ~~LOC~~ SOLN: 2.0000 [IU] | SUBCUTANEOUS | Status: DC

## 2016-07-28 MED ORDER — FENTANYL CITRATE (PF) 100 MCG/2ML IJ SOLN: 50.0000 ug | Freq: Once | INTRAMUSCULAR | Status: DC

## 2016-07-28 NOTE — Progress Notes (Signed)
Initial Nutrition Assessment  DOCUMENTATION CODES:  Morbid obesity  INTERVENTION:  When/If appropriate, TF recommendations for feeding based off of current values are Vital High Protein at goal rate of 5 ml/h (120 ml per day) and Prostat 60 ml QID to provide 920 kcals (+384 from propofol), 131 gm protein, 100 ml free water daily.  NUTRITION DIAGNOSIS: Inadequate oral intake related to inability to eat as evidenced by NPO status.  GOAL:  Provide needs based on ASPEN/SCCM guidelines  MONITOR:  Vent status, Labs, TF tolerance  REASON FOR ASSESSMENT:  Ventilator    ASSESSMENT:  71 y/o female PMHx Afib/flutter, HTN, DM2, HF, GERD, HLD, tobacco abuse, COPD, OSA, morbid obesity, hypothyroidism. Pt was discharged same day after beginning med for afib. Found unresponsive at home by family. Intubated by EMS and admitted to ICU for hypothermia protocol.   Pt intubated, sedated. No family members present. Was just discharged from the hospital. Per review of those records, pt was very consistently consuming 100% of her meals the days leading up to discharge.   Wt has increased over past few months. Admitted at weight of ~ 360 lbs.   Patient is currently intubated on ventilator support MV: 12.9 L/min Temp (24hrs), Avg:92.2 F (33.4 C), Min:90.3 F (32.4 C), Max:94.8 F (34.9 C)  Propofol: 25.9 ml/hr = 684 kcal/day  Epi in D5 @ current rate 67- 11.5 kcals/hr Levo in D5 @ current rate 39- 6.6 kcal/hr  Labs reviewed: Hyperglycemic >400 mg/dl Medications: Nimbex, fentanyl,. Propofol, IV Abx, KCL, ppi, levo, epi, insulin   Recent Labs Lab 08-07-2016 0430 Aug 07, 2016 2315  07/28/16 0056  07/28/16 0500 07/28/16 0653 07/28/16 0832 07/28/16 1035  NA 144 142  < > 145  < > 142 144 142 142  K 4.2 4.4  < > 2.8*  < > 3.2* 3.2* 3.1* 3.1*  CL 96* 97*  < > 110  < > 102 99* 99* 99*  CO2 35* 24  --  24  --  24  --   --   --   BUN 29* 33*  < > 28*  < > 34* 34* 35* 34*  CREATININE 1.25* 1.84*  < >  1.35*  < > 1.78* 1.70* 1.70* 1.60*  CALCIUM 9.2 9.9  --  7.2*  --  8.4*  --   --   --   MG 2.0 2.0  --   --   --  1.8  --   --   --   PHOS  --  6.9*  --   --   --  5.3*  --   --   --   GLUCOSE 102* 314*  < > 295*  < > 380* 405* 408* 411*  < > = values in this interval not displayed.  Diet Order:     Skin:  Reviewed, no issues  Last BM:  Unknown  Height:  Ht Readings from Last 1 Encounters:  07/23/16 5\' 8"  (1.727 m)   Weight:  Wt Readings from Last 1 Encounters:  08-07-2016 (!) 380 lb 12.8 oz (172.7 kg)   Wt Readings from Last 10 Encounters:  August 07, 2016 (!) 380 lb 12.8 oz (172.7 kg)  07/17/16 (!) 386 lb 1.6 oz (175.1 kg)  05/14/16 (!) 386 lb 9.6 oz (175.4 kg)  02/16/16 (!) 386 lb 9.6 oz (175.4 kg)  12/19/15 (!) 376 lb (170.6 kg)  10/31/15 (!) 371 lb (168.3 kg)  07/04/15 (!) 358 lb (162.4 kg)  04/29/15 (!) 371 lb 6.4 oz (168.5  kg)  11/29/14 (!) 372 lb 3.2 oz (168.8 kg)  11/04/14 (!) 361 lb 6.4 oz (163.9 kg)  Admit weight: 362 lbs (164.55 kg)  Ideal Body Weight:  63.64 kg  BMI: Body mass index using admit weight and Ht from last admission is 55.2 kg/m.  Estimated Nutritional Needs:  Kcal:  1400-1600 kcals (22-25 kcal/kg ibw) Protein:  127-159 g Pro (2-2.5 g/kg bw) Fluid:  Per MD  EDUCATION NEEDS:  No education needs identified at this time  Christophe Louis RD, LDN, CNSC Clinical Nutrition Pager: [phone] 07/28/2016 12:43 PM

## 2016-07-28 NOTE — Progress Notes (Signed)
eLink Physician-Brief Progress Note Patient Name: Nancy West DOB: [birth year] MRN: [id number]   Date of Service  07/28/2016  HPI/Events of Note  Mg++ = 1.4 and Creatinine = 1.8.  eICU Interventions  Will replace Mg++.      Intervention Category Intermediate Interventions: Electrolyte abnormality - evaluation and management  Sommer,Steven Eugene 07/28/2016, 10:16 PM

## 2016-07-28 NOTE — Procedures (Signed)
History: 71 yo F being evaluated s/p cardiac arrest  Sedation: Propofol 74mcg/kg/min  Technique: This is a 21 channel routine scalp EEG performed at the bedside with bipolar and monopolar montages arranged in accordance to the international 10/20 system of electrode placement. One channel was dedicated to EKG recording.    Background: The background consists of a burst suppression pattern with fairly long interburst interval of 1 - minutes followed by 3-4 bursts lasting about a second with 2-3 second interburst interval. The bursts consist of intermixed alpha and theta activity with some sharply contoured frontal component.     Photic stimulation: Physiologic driving is not performed  EEG Abnormalities: 1) Burst suppression  Clinical Interpretation: This EEG is consistent with a profound cerebral dysfunction as can be seen in medication induced coma as well as severe hypoxic brain injury.   There was no seizure recorded on this study.   Ritta Slot, MD [facility] [phone]  If 7pm- 7am, please page neurology on call as listed in AMION.

## 2016-07-28 NOTE — Progress Notes (Signed)
EEG Completed; Results Pending  

## 2016-07-28 NOTE — Progress Notes (Signed)
Epi off at around 2019; Titration of pressors for frequent ectopy recommended by cards MD who rounded this morning. Cards MD also recommended a titration of pressors to a goal of 120 systolic. Given patient's heart failure, Cards MD concerned systolic was too high as it sustained in the 150s on pressor support.  Patient also experienced frequent ectopy, so hopefully with Epi off, her heart will have greater contractility and and she will be able to maintain pressure on her own.   However, patient was continuing to experience ectopy (slightly less frequent) with Epi off, and HR was dropping to to as low as 46.  RN notified on-call Cards MD about this.  Cards MD ordered STAT mag and said to continue monitoring.  If HR drops below 40 to notify Cards.    RN will continue to monitor patient and titrate levo to a systolic of 120.

## 2016-07-28 NOTE — Progress Notes (Signed)
eLink Physician-Brief Progress Note Patient Name: Nancy West DOB: 27-Dec-1945 MRN: [id number]   Date of Service  07/28/2016  HPI/Events of Note  K+ = 3.1 and Creatinine = 1.70.  eICU Interventions  Will replace K+.     Intervention Category Intermediate Interventions: Electrolyte abnormality - evaluation and management  Sommer,Steven Eugene 07/28/2016, 7:51 PM

## 2016-07-28 NOTE — Procedures (Signed)
Central Venous Catheter Insertion Procedure Note AKIBA PENLAND [id number] 11-Nov-1945  Procedure: Insertion of Central Venous Catheter Indications: Assessment of intravascular volume, Drug and/or fluid administration and Frequent blood sampling  Procedure Details Consent: Unable to obtain consent because of emergent medical necessity. Time Out: Verified patient identification, verified procedure, site/side was marked, verified correct patient position, special equipment/implants available, medications/allergies/relevent history reviewed, required imaging and test results available.  Performed  Maximum sterile technique was used including antiseptics, cap, gloves, gown, hand hygiene, mask and sheet. Skin prep: Chlorhexidine; local anesthetic administered A antimicrobial bonded/coated triple lumen catheter was placed in the left subclavian vein using the Seldinger technique.  Evaluation Blood flow good Complications: No apparent complications Patient did tolerate procedure well. Chest X-ray ordered to verify placement.  CXR: normal.  Demarkis Gheen K. 07/28/2016, 6:39 AM

## 2016-07-28 NOTE — Progress Notes (Signed)
Pharmacy Antibiotic Note  Nancy West is a 71 y.o. female admitted on 2016/08/03 with cardiac arrest.  Pharmacy has been consulted for Vancomycin/Zosyn dosing for HCAP vs Aspiration PNA. Normalized CrCL is ~40.   Plan: -Vancomycin 2000 mg IV x 1, then give 1750 mg IV q24h -Zosyn 3.375G IV q8h to be infused over 4 hours -Trend WBC, temp, renal function  -Drug levels as indicated  Temp (24hrs), Avg:95.4 F (35.2 C), Min:93.4 F (34.1 C), Max:98.1 F (36.7 C)   Recent Labs Lab 07/23/16 1745  07/26/16 0246 August 03, 2016 0430 2016-08-03 2315 03-Aug-2016 2324 08/03/16 2326 07/28/16 0056  WBC 12.2*  --   --   --  27.4*  --   --   --   CREATININE 1.27*  < > 1.38* 1.25* 1.84* 1.60*  --  1.35*  LATICACIDVEN  --   --   --   --   --   --  7.87*  --   < > = values in this interval not displayed.  Estimated Creatinine Clearance: 67.6 mL/min (by C-G formula based on SCr of 1.35 mg/dL (H)).    Allergies  Allergen Reactions  . Augmentin [Amoxicillin-Pot Clavulanate] Other (See Comments)    Abdominal pain   Abran Duke 07/28/2016 2:47 AM

## 2016-07-28 NOTE — Progress Notes (Signed)
ANTICOAGULATION CONSULT NOTE - Initial Consult  Pharmacy Consult for Heparin (hold Xarelto) Indication: chest pain/ACS, s/p cardiac arrest with ROSC  Allergies  Allergen Reactions  . Augmentin [Amoxicillin-Pot Clavulanate] Other (See Comments)    Abdominal pain    Vital Signs: BP: 144/66 (02/03 0030) Pulse Rate: 35 (02/03 0030)  Labs:  Recent Labs  08/13/2016 0430 07/26/2016 2315 08/11/2016 2324  HGB  --  11.0* 12.6  HCT  --  36.8 37.0  PLT  --  207  --   CREATININE 1.25* 1.84* 1.60*    Estimated Creatinine Clearance: 57.1 mL/min (by C-G formula based on SCr of 1.6 mg/dL (H)).   Medical History: Past Medical History:  Diagnosis Date  . Anemia    Noted 12/2011  . Atrial fib/flutter, transient    In setting of pericardial effusion 12/2011  . Chest pain    a. 12/2011 Neg Lexi MV, EF 56%.    . Diastolic CHF (HCC)   . GERD (gastroesophageal reflux disease)   . Hyperlipidemia   . Hypertension   . Hypothyroidism   . Obesity   . OSA (obstructive sleep apnea) 11/05/2013   noncompliant with CPAP  . Pericardial effusion    a. Large, s/p pericardiocentesis 01/02/12 yielding fluid (neg malignant cells, only inflammatory cells)  . Pneumonia 09/01/2013   "for the first time" (09/01/2013)  . Tobacco dependence    a. 46 pack yr hx.  . Type 2 diabetes, diet controlled (HCC)   . Venous stasis of lower extremity    a. chronic LEE    Assessment: 71 y/o F s/p witnessed cardiac arrest with ROSC to be initiated on therapeutic hypothermia. Starting heparin drip. Pt is on Xarelto PTA for afib with last dose 2/2 ~1800 per family. Will check baseline aPTT/HL before starting heparin later today. Will likely be using aPTT to dose with Xarelto influence on anti-Xa levels.   Goal of Therapy:  Heparin level 0.3-0.7 units/ml aPTT 66-102 seconds Monitor platelets by anticoagulation protocol: Yes   Plan:  -Start heparin drip at 900 units/hr at 1800 today -2/4: 0000 aPTT/HL -Daily  CBC/aPTT/HL -Monitor for bleeding  Abran Duke 07/28/2016,1:14 AM

## 2016-07-28 NOTE — H&P (Signed)
PULMONARY / CRITICAL CARE MEDICINE   Name: Nancy West MRN: [id number] DOB: 20-Apr-1946    ADMISSION DATE:  2016/08/11 CONSULTATION DATE:  11-Aug-2016  REFERRING MD:  Dr. Abigail Miyamoto  CHIEF COMPLAINT:  PEA arrest  HISTORY OF PRESENT ILLNESS:   Nancy West is a 71 year old female with a significant past medical history of atrial fibrillation/flutter, hypertension, type II diabetes, mild systolic heart failure, prior pericardial requiring pericardiocentesis, tobacco abuse, COPD with chronic hypoxia requiring supplemental oxygen, obstructive sleep apnea, morbid obesity, and hypothyroidism who was sent in today after being found down at home due to cardiac arrest and underlying rhythm being ventricular fibrillation.    Patient is currently intubated at this time and family that witnessed the event is not available.  History was taken from chart documentation and emergency physician.  Per documentation and report, patient was recently discharged home today after starting Tikosyn for atrial fibrillation.  It was stated that patient was eventually found down at home unresponsive with a grey appearance by family.  Emergency services was called to the home.  Per emergency medical services report, patient was noted to be in ventricular fibrillation.  She required a total of 3 shocks before she developed a normal spontaneous rhythm.  It sounds like patient had a brief episode of atrial fibrillation and then sinus bradycardia for which emergency medical services started transcutaneous pacing.  She was intubated prior to coming into the emergency room.  She was given IV amiodarone prior to being admitted and was placed on IV epinephrine.     She was admitted to the cardiac ICU for the hypothermia protocol.  PAST MEDICAL HISTORY :  She  has a past medical history of Anemia; Atrial fib/flutter, transient; Chest pain; Diastolic CHF (HCC); GERD (gastroesophageal reflux disease); Hyperlipidemia; Hypertension;  Hypothyroidism; Obesity; OSA (obstructive sleep apnea) (11/05/2013); Pericardial effusion; Pneumonia (09/01/2013); Tobacco dependence; Type 2 diabetes, diet controlled (HCC); and Venous stasis of lower extremity.  PAST SURGICAL HISTORY: She  has a past surgical history that includes Breast biopsy (Right, 1980's); Cholecystectomy (1980's); Appendectomy (~ 2008); Pericardiocentesis (12/2011); pericardial tap (N/A, 01/02/2012); TEE without cardioversion (N/A, 07/16/2016); and Cardioversion (N/A, 07/16/2016).  Allergies  Allergen Reactions  . Augmentin [Amoxicillin-Pot Clavulanate] Other (See Comments)    Abdominal pain    No current facility-administered medications on file prior to encounter.    Current Outpatient Prescriptions on File Prior to Encounter  Medication Sig  . acetaminophen (TYLENOL) 650 MG CR tablet Take 650 mg by mouth every 8 (eight) hours as needed (for back pain).  . Albuterol Sulfate (PROAIR RESPICLICK) 108 (90 BASE) MCG/ACT AEPB Inhale 2 puffs into the lungs every 6 (six) hours as needed. (Patient not taking: Reported on 07/23/2016)  . atorvastatin (LIPITOR) 40 MG tablet TAKE 1 TABLET(40 MG) BY MOUTH DAILY  . benazepril (LOTENSIN) 20 MG tablet Take 1 tablet (20 mg total) by mouth daily.  . Cholecalciferol (VITAMIN D) 1000 UNITS capsule Take 1,000 Units by mouth daily.   Marland Kitchen diltiazem (CARDIZEM CD) 300 MG 24 hr capsule Take 1 capsule (300 mg total) by mouth daily.  Marland Kitchen dofetilide (TIKOSYN) 500 MCG capsule Take 1 capsule (500 mcg total) by mouth 2 (two) times daily.  Marland Kitchen FLUoxetine (PROZAC) 20 MG capsule Take 1 capsule (20 mg total) by mouth daily.  . furosemide (LASIX) 40 MG tablet TAKE 1 TABLET BY MOUTH TWICE DAILY  . levothyroxine (SYNTHROID, LEVOTHROID) 50 MCG tablet TAKE 1 TABLET(50 MCG) BY MOUTH DAILY  . magnesium oxide (MAG-OX)  400 (241.3 Mg) MG tablet Take 1 tablet (400 mg total) by mouth 2 (two) times daily.  . metoprolol tartrate (LOPRESSOR) 25 MG tablet Take 1 tablet (25 mg  total) by mouth 2 (two) times daily.  . Multiple Vitamins-Minerals (ONE-A-DAY WOMENS 50+ ADVANTAGE) TABS Take 1 tablet by mouth daily after breakfast.  . mupirocin cream (BACTROBAN) 2 % Apply 1 application topically 2 (two) times daily. To affected areas near tailbone  . omeprazole (PRILOSEC) 20 MG capsule Take 1 capsule (20 mg total) by mouth 2 (two) times daily.  . potassium chloride (K-DUR,KLOR-CON) 10 MEQ tablet Take 1 tablet (10 mEq total) by mouth daily.  . rivaroxaban (XARELTO) 20 MG TABS tablet Take 1 tablet (20 mg total) by mouth daily with supper.  . SYMBICORT 160-4.5 MCG/ACT inhaler INHALE 2 PUFFS INTO THE LUNGS TWICE DAILY    FAMILY HISTORY:  Her indicated that her mother is deceased. She indicated that her father is deceased. She indicated that her sister is alive.    SOCIAL HISTORY: She  reports that she quit smoking about 2 years ago. Her smoking use included Cigarettes. She quit after 50.00 years of use. She has never used smokeless tobacco. She reports that she does not drink alcohol or use drugs.  REVIEW OF SYSTEMS:   Could not be obtained as pt is intubated and sedated.   SUBJECTIVE:    VITAL SIGNS: BP 134/58   Pulse (!) 41   Temp (!) 90.5 F (32.5 C) (Core (Comment))   Resp (!) 26   SpO2 98%   HEMODYNAMICS: CVP:  [12 mmHg] 12 mmHg  VENTILATOR SETTINGS: Vent Mode: PRVC FiO2 (%):  [80 %-100 %] 80 % Set Rate:  [20 bmp-26 bmp] 26 bmp Vt Set:  [510 mL] 510 mL PEEP:  [10 cmH20-14 cmH20] 14 cmH20 Plateau Pressure:  [26 cmH20-28 cmH20] 28 cmH20  INTAKE / OUTPUT: No intake/output data recorded.  PHYSICAL EXAMINATION: General:  Obese pt, intubated, sedated Neuro:  PT non-responsive HEENT:  Pupils 2mm, ETT in place Cardiovascular:  Bradycardic, no MRG Lungs:  CTAB Abdomen:  Obese, NTND Musculoskeletal:  No edema Skin:  Chronic venous stasis changes on shins and ankles  LABS:  BMET  Recent Labs Lab 07/28/2016 2315  07/28/16 0056 07/28/16 0238  07/28/16 0440 07/28/16 0500  NA 142  < > 145 143 144 142  K 4.4  < > 2.8* 3.3* 3.2* 3.2*  CL 97*  < > 110 101 99* 102  CO2 24  --  24  --   --  24  BUN 33*  < > 28* 32* 34* 34*  CREATININE 1.84*  < > 1.35* 1.70* 1.60* 1.78*  GLUCOSE 314*  < > 295* 342* 387* 380*  < > = values in this interval not displayed.  Electrolytes  Recent Labs Lab 08/19/2016 0430 07/31/2016 2315 07/28/16 0056 07/28/16 0500  CALCIUM 9.2 9.9 7.2* 8.4*  MG 2.0 2.0  --  1.8  PHOS  --  6.9*  --  5.3*    CBC  Recent Labs Lab 07/23/16 1745 08/06/2016 2315  07/28/16 0238 07/28/16 0440 07/28/16 0500  WBC 12.2* 27.4*  --   --   --  32.7*  HGB 11.3* 11.0*  < > 9.9* 10.9* 10.2*  HCT 37.7 36.8  < > 29.0* 32.0* 34.3*  PLT 248 207  --   --   --  233  < > = values in this interval not displayed.  Coag's  Recent Labs Lab 07/28/16 [id number]  APTT 37*  INR 2.59    Sepsis Markers  Recent Labs Lab 08/19/2016 2326  LATICACIDVEN 7.87*    ABG  Recent Labs Lab 07/28/16 0059 07/28/16 0436  PHART 7.198* 7.280*  PCO2ART 77.4* 49.0*  PO2ART 61.0* 208*    Liver Enzymes  Recent Labs Lab 07/23/16 1745 2016-08-19 2315  AST 51* 45*  ALT 24 27  ALKPHOS 69 80  BILITOT 1.4* 0.7  ALBUMIN 3.8 3.5    Cardiac Enzymes  Recent Labs Lab 07/28/16 0056  TROPONINI 0.34*    Glucose  Recent Labs Lab 07/26/16 1631 07/26/16 2121 August 19, 2016 0641 08-19-2016 1121 07/28/16 0205 07/28/16 0441  GLUCAP 97 106* 117* 109* 305* 345*    Imaging Dg Abd 1 View  Result Date: 07/28/2016 CLINICAL DATA:  OG tube placement EXAM: ABDOMEN - 1 VIEW COMPARISON:  None FINDINGS: The orogastric tube extends into the stomach with tip in the region of the fundus. IMPRESSION: Orogastric tube extends well into the stomach. Electronically Signed   By: Ellery Plunk M.D.   On: 07/28/2016 03:16   Ct Head Wo Contrast  Result Date: 07/28/2016 CLINICAL DATA:  Post arrest. EXAM: CT HEAD WITHOUT CONTRAST TECHNIQUE: Contiguous axial images  were obtained from the base of the skull through the vertex without intravenous contrast. COMPARISON:  None. FINDINGS: Brain: There is no intracranial hemorrhage, mass or evidence of acute infarction. There is mild generalized atrophy. There is mild chronic microvascular ischemic change. There is no significant extra-axial fluid collection. No acute intracranial findings are evident. Vascular: No hyperdense vessel or unexpected calcification. Skull: Normal. Negative for fracture or focal lesion. Sinuses/Orbits: No acute finding. Other: Left frontal scalp hematoma. IMPRESSION: Left frontal scalp hematoma. No acute intracranial findings. There is moderate generalized atrophy and chronic appearing white matter hypodensities which likely represent small vessel ischemic disease. Electronically Signed   By: Ellery Plunk M.D.   On: 07/28/2016 00:26   Dg Chest Port 1 View  Result Date: 07/28/2016 CLINICAL DATA:  Central line placement EXAM: PORTABLE CHEST 1 VIEW COMPARISON:  August 19, 2016 FINDINGS: There is a new left subclavian central line extending to the SVC. No pneumothorax. Endotracheal tube tip is 3 cm above the carina. Nasogastric tube extends below the diaphragm and beyond the inferior edge of the image. Airspace opacity persists in the right upper lobe. IMPRESSION: 1. Support equipment appears satisfactorily positioned. New left subclavian central line. No pneumothorax. 2. Persistent airspace opacity in the right upper lobe. Electronically Signed   By: Ellery Plunk M.D.   On: 07/28/2016 02:53   Dg Chest Port 1 View  Result Date: 08/19/16 CLINICAL DATA:  Emergent intubation EXAM: PORTABLE CHEST 1 VIEW COMPARISON:  07/23/2016 FINDINGS: Endotracheal tube tip is approximately 1.6 cm above the carina. Confluent airspace opacity in the right upper lobe could be infectious. Pulmonary hemorrhage or aspiration are also possibilities. Left lung is grossly clear. No large pneumothorax. IMPRESSION: 1.  Satisfactorily positioned ETT. 2. Consolidation in the right upper lobe, possibly pneumonia but aspiration or hemorrhage could also produce this appearance. Electronically Signed   By: Ellery Plunk M.D.   On: 08-19-2016 23:34     STUDIES:  Head CT 2/2 - no acute findings  CULTURES: Blood 2/2  ANTIBIOTICS: Vanc 2/2 Zosyn 2/2  SIGNIFICANT EVENTS:   LINES/TUBES: ETT - 2/2 CVC left subclavian - 2/2  DISCUSSION: 71 y.o woman s/p PEA arrest.  ASSESSMENT / PLAN:  PULMONARY A: Intubated P:   Lung protective ventilation VAP SUP   CARDIOVASCULAR A:  PEA arrest P:  33C hypothermia protocol Echo Pressors to maintain BP  RENAL A:   AKI P:   Avoid nephrotoxic agents    INFECTIOUS A:   Possible aspiration pna P:   Vanc, zosyn  ENDOCRINE A:   DM   P:   Insulin GTT  NEUROLOGIC A:   sedated P:   RASS goal: -4 EEG    FAMILY  - Updates:   - Inter-disciplinary family meet or Palliative Care meeting due by:  08/03/16  I spent 35 minutes of critical care time in the care of this patient separate from procedures which are documented elsewhere  Sandi Carne, MD, PhD Pulmonary and Critical Care Medicine [facility] Pager: [phone]  07/28/2016, 6:40 AM

## 2016-07-28 NOTE — Procedures (Signed)
Arterial Catheter Insertion Procedure Note Nancy West [id number] 1945-12-19  Procedure: Insertion of Arterial Catheter  Indications: Blood pressure monitoring and Frequent blood sampling  Procedure Details Consent: Unable to obtain consent because of pt intubated. Time Out: Verified patient identification, verified procedure, site/side was marked, verified correct patient position, special equipment/implants available, medications/allergies/relevent history reviewed, required imaging and test results available.  Performed  Maximum sterile technique was used including antiseptics, cap, gloves, gown, hand hygiene, mask and sheet. Skin prep: Chlorhexidine; local anesthetic administered 20 gauge catheter was inserted into left radial artery using the Seldinger technique.  Evaluation Blood flow good; BP tracing good. Complications: No apparent complications.   Berenice Primas 07/28/2016

## 2016-07-28 NOTE — Progress Notes (Signed)
  Echocardiogram 2D Echocardiogram has been performed.  Delcie Roch 07/28/2016, 5:05 PM

## 2016-07-28 NOTE — ED Notes (Signed)
Pt returned from CT °

## 2016-07-28 NOTE — ED Notes (Signed)
MD at bedside. 

## 2016-07-28 NOTE — Progress Notes (Signed)
Hypoglycemic Event  CBG: 65 (@ 2250) CBG: 57 (@ 2316)  Treatment: 17 mL d50 per glucose stabilizer    Follow-up CBG: Time: 2330 CBG Result: 84  Possible Reasons for Event: Insulin gtt/ code cool/ hypokalemia    RN will continue to assess and follow glucose stabilizer   Jeannie Fend

## 2016-07-28 NOTE — Progress Notes (Signed)
Patient ID: Nancy West, female   DOB: 1945/08/26, 71 y.o.   MRN: [id number]   SUBJECTIVE: Patient is intubated, sedated, cooled.   BP running 150s/50s, on epinephrine 18 and norepinephrine 42 with frequent PVCs.   ECG today with NSR, LBBB, QTc 537 (cooled).   TnI 0.34 => 0.64  Scheduled Meds: . artificial tears  1 application Both Eyes X7D  . chlorhexidine gluconate (MEDLINE KIT)  15 mL Mouth Rinse BID  . Chlorhexidine Gluconate Cloth  6 each Topical Daily  . fentaNYL (SUBLIMAZE) injection  50 mcg Intravenous Once  . mouth rinse  15 mL Mouth Rinse 10 times per day  . pantoprazole (PROTONIX) IV  40 mg Intravenous QHS  . piperacillin-tazobactam (ZOSYN)  IV  3.375 g Intravenous Q8H  . potassium chloride (KCL MULTIRUN) 30 mEq in 265 mL IVPB  30 mEq Intravenous Once  . sodium chloride flush  10-40 mL Intracatheter Q12H  . [START ON 07/29/2016] vancomycin  1,750 mg Intravenous Q24H   Continuous Infusions: . sodium chloride    . sodium chloride 100 mL/hr at 07/28/16 0800  . cisatracurium (NIMBEX) infusion 1.496 mcg/kg/min (07/28/16 0800)  . epinephrine 18 mcg/min (07/28/16 0800)  . fentaNYL infusion INTRAVENOUS 100 mcg/hr (07/28/16 0800)  . heparin    . insulin (NOVOLIN-R) infusion 13.1 Units/hr (07/28/16 0800)  . norepinephrine (LEVOPHED) Adult infusion 42 mcg/min (07/28/16 0800)  . propofol (DIPRIVAN) infusion 25 mcg/kg/min (07/28/16 0800)   PRN Meds:.[COMPLETED] cisatracurium **AND** cisatracurium (NIMBEX) infusion **AND** cisatracurium, fentaNYL, fentaNYL (SUBLIMAZE) injection, sodium chloride flush  Vitals:   07/28/16 0715 07/28/16 0730 07/28/16 0800 07/28/16 0835  BP:      Pulse: (!) 42 (!) 38 (!) 58 62  Resp: (!) 26 19 (!) 26 (!) 26  Temp:   (!) 91.8 F (33.2 C)   TempSrc:   Core (Comment)   SpO2: 99% 94% 96% 96%    Intake/Output Summary (Last 24 hours) at 07/28/16 0927 Last data filed at 07/28/16 0800  Gross per 24 hour  Intake          1484.04 ml  Output                40 ml  Net          1444.04 ml    LABS: Basic Metabolic Panel:  Recent Labs  08/09/2016 2315  07/28/16 0056  07/28/16 0500 07/28/16 0653 07/28/16 0832  NA 142  < > 145  < > 142 144 142  K 4.4  < > 2.8*  < > 3.2* 3.2* 3.1*  CL 97*  < > 110  < > 102 99* 99*  CO2 24  --  24  --  24  --   --   GLUCOSE 314*  < > 295*  < > 380* 405* 408*  BUN 33*  < > 28*  < > 34* 34* 35*  CREATININE 1.84*  < > 1.35*  < > 1.78* 1.70* 1.70*  CALCIUM 9.9  --  7.2*  --  8.4*  --   --   MG 2.0  --   --   --  1.8  --   --   PHOS 6.9*  --   --   --  5.3*  --   --   < > = values in this interval not displayed. Liver Function Tests:  Recent Labs  08/18/2016 2315  AST 45*  ALT 27  ALKPHOS 80  BILITOT 0.7  PROT 6.8  ALBUMIN 3.5  No results for input(s): LIPASE, AMYLASE in the last 72 hours. CBC:  Recent Labs  08/17/2016 2315  07/28/16 0500 07/28/16 0653 07/28/16 0832  WBC 27.4*  --  32.7*  --   --   NEUTROABS 22.8*  --   --   --   --   HGB 11.0*  < > 10.2* 11.2* 11.2*  HCT 36.8  < > 34.3* 33.0* 33.0*  MCV 95.8  --  94.8  --   --   PLT 207  --  233  --   --   < > = values in this interval not displayed. Cardiac Enzymes:  Recent Labs  07/28/16 0056 07/28/16 0500  TROPONINI 0.34* 0.64*   BNP: Invalid input(s): POCBNP D-Dimer: No results for input(s): DDIMER in the last 72 hours. Hemoglobin A1C: No results for input(s): HGBA1C in the last 72 hours. Fasting Lipid Panel: No results for input(s): CHOL, HDL, LDLCALC, TRIG, CHOLHDL, LDLDIRECT in the last 72 hours. Thyroid Function Tests: No results for input(s): TSH, T4TOTAL, T3FREE, THYROIDAB in the last 72 hours.  Invalid input(s): FREET3 Anemia Panel: No results for input(s): VITAMINB12, FOLATE, FERRITIN, TIBC, IRON, RETICCTPCT in the last 72 hours.  RADIOLOGY: Dg Chest 2 View  Result Date: 07/14/2016 CLINICAL DATA:  Pt reports sharp back pain in upper right shoulder blade yesterday that "punched through pt" lasting about 5  minutes. Pt states since then, she has felt shaky and had cold sweats EXAM: CHEST  2 VIEW COMPARISON:  Radiograph 10/31/2015 FINDINGS: Stable enlarged cardiac silhouette. Central venous pulmonary congestion increased from prior. New focus of consolidation. No pleural fluid. IMPRESSION: Cardiomegaly and central venous congestion suggest mild congestive heart failure Electronically Signed   By: Nancy West M.D.   On: 07/14/2016 14:17   Dg Abd 1 View  Result Date: 07/28/2016 CLINICAL DATA:  OG tube placement EXAM: ABDOMEN - 1 VIEW COMPARISON:  None FINDINGS: The orogastric tube extends into the stomach with tip in the region of the fundus. IMPRESSION: Orogastric tube extends well into the stomach. Electronically Signed   By: Nancy West M.D.   On: 07/28/2016 03:16   Ct Head Wo Contrast  Result Date: 07/28/2016 CLINICAL DATA:  Post arrest. EXAM: CT HEAD WITHOUT CONTRAST TECHNIQUE: Contiguous axial images were obtained from the base of the skull through the vertex without intravenous contrast. COMPARISON:  None. FINDINGS: Brain: There is no intracranial hemorrhage, mass or evidence of acute infarction. There is mild generalized atrophy. There is mild chronic microvascular ischemic change. There is no significant extra-axial fluid collection. No acute intracranial findings are evident. Vascular: No hyperdense vessel or unexpected calcification. Skull: Normal. Negative for fracture or focal lesion. Sinuses/Orbits: No acute finding. Other: Left frontal scalp hematoma. IMPRESSION: Left frontal scalp hematoma. No acute intracranial findings. There is moderate generalized atrophy and chronic appearing white matter hypodensities which likely represent small vessel ischemic disease. Electronically Signed   By: Nancy West M.D.   On: 07/28/2016 00:26   Ct Angio Chest Pe W Or Wo Contrast  Result Date: 07/14/2016 CLINICAL DATA:  Elevated D-dimer, chest pain. EXAM: CT ANGIOGRAPHY CHEST WITH CONTRAST  TECHNIQUE: Multidetector CT imaging of the chest was performed using the standard protocol during bolus administration of intravenous contrast. Multiplanar CT image reconstructions and MIPs were obtained to evaluate the vascular anatomy. CONTRAST:  80 cc Isovue COMPARISON:  Chest radiograph 07/14/2016 FINDINGS: Cardiovascular: Exam is somewhat limited by patient body habitus. No filling defects within the proximal pulmonary arteries. The lower lobe  pulmonary arteries are not well evaluated. Mediastinum/Nodes: No axillary supraclavicular adenopathy. No mediastinal adenopathy. Esophagus normal. Lungs/Pleura: No pulmonary infarction. No consolidation. Mild interstitial thickening. Minimal atelectasis. No effusion. Upper Abdomen: Limited view of the liver, kidneys, pancreas are unremarkable. Normal adrenal glands. Musculoskeletal: No aggressive osseous lesion. Review of the MIP images confirms the above findings. IMPRESSION: 1. No evidence acute pulmonary embolism in suboptimal exam due to patient body habitus. 2. Mild interstitial edema. 3. No overt pulmonary edema, infarction or pneumonia. Electronically Signed   By: Nancy West M.D.   On: 07/14/2016 16:38   Dg Chest Port 1 View  Result Date: 07/28/2016 CLINICAL DATA:  Central line placement EXAM: PORTABLE CHEST 1 VIEW COMPARISON:  08/13/2016 FINDINGS: There is a new left subclavian central line extending to the SVC. No pneumothorax. Endotracheal tube tip is 3 cm above the carina. Nasogastric tube extends below the diaphragm and beyond the inferior edge of the image. Airspace opacity persists in the right upper lobe. IMPRESSION: 1. Support equipment appears satisfactorily positioned. New left subclavian central line. No pneumothorax. 2. Persistent airspace opacity in the right upper lobe. Electronically Signed   By: Nancy West M.D.   On: 07/28/2016 02:53   Dg Chest Port 1 View  Result Date: 08/02/2016 CLINICAL DATA:  Emergent intubation EXAM:  PORTABLE CHEST 1 VIEW COMPARISON:  07/23/2016 FINDINGS: Endotracheal tube tip is approximately 1.6 cm above the carina. Confluent airspace opacity in the right upper lobe could be infectious. Pulmonary hemorrhage or aspiration are also possibilities. Left lung is grossly clear. No large pneumothorax. IMPRESSION: 1. Satisfactorily positioned ETT. 2. Consolidation in the right upper lobe, possibly pneumonia but aspiration or hemorrhage could also produce this appearance. Electronically Signed   By: Nancy West M.D.   On: 08/13/2016 23:34   Dg Chest Portable 1 View  Result Date: 07/23/2016 CLINICAL DATA:  Shortness of breath, tachycardia EXAM: PORTABLE CHEST 1 VIEW COMPARISON:  CT chest 07/14/2014 FINDINGS: There is bilateral mild interstitial thickening. There is no focal parenchymal opacity. There is no pleural effusion or pneumothorax. There is stable cardiomegaly. The osseous structures are unremarkable. IMPRESSION: Cardiomegaly with mild pulmonary vascular congestion. Electronically Signed   By: Kathreen Devoid   On: 07/23/2016 18:45    PHYSICAL EXAM General: Intubated, sedated.  Neck: Thick, JVP difficult, no thyromegaly or thyroid nodule.  Lungs: Dependent crackles. CV: Nondisplaced PMI.  Heart regular S1/S2, no S3/S4, no murmur.  1+ edema to knees bilaterally.  Abdomen: Soft, nontender, no hepatosplenomegaly, no distention.  Neurologic: Sedated.  Extremities: No clubbing or cyanosis.   TELEMETRY: Reviewed telemetry pt in NSR with PVCs  ASSESSMENT AND PLAN: 71 yo with history of paroxysmal atrial fibrillation, mild cardiomyopathy, COPD on home oxygen, pericardial effusion with prior pericardiocentesis, and type II diabetes was discharged after Tikosyn load yesterday and had cardiac arrest/ventricular fibrillation.  Now cooled.  1. Cardiac arrest: Ventricular fibrillation, found down at home by family.  She was defibrillated and has remained in NSR.  She is undergoing hypothermia protocol.  I suspect that arrest is most likely Tikosyn-related.  She was just discharged yesterday after Tikosyn load.  She has CKD stage III at baseline.  QTc was < 500 during her admission for Tikosyn load.  Now > 500 msec but also hypokalemic and being cooled. Troponin was mildly elevated, likely due to arrest/hypotension.  Now on epinephrine/norepinephrine to keep BP up.  - SBP 150s/50s.  Given wide pulse pressure, taking high doses of epinephrine and norepinephrine to keep MAP >  80.  Given very frequent ventricular ectopy, would suggest lowering BP goal a bit to allow titration down of epinephrine.   - Repeat echocardiogram.  2. Elevated troponin: Mild elevation with minimal trend.  Suspect due to demand ischemia with arrest/hypotension.  No plan for cardiac cath at this point.  3. Atrial fibrillation: Paroxysmal.  Now in NSR.  Will not be Tikosyn candidate in the future.  4. AKI on CKD stage III: Mild elevation in creatinine to 1.7.  5. Chronic primarily diastolic CHF: EF 01-56% on last echo with RV moderately dilated/moderately dysfunctional.  Low EF thought to be related to atrial fibrillation.  - Repeat echo.  6. COPD: On home oxygen.  7. ID: RUL opacity, covering with Zosyn for possible aspiration PNA.   Loralie Champagne 07/28/2016 9:37 AM

## 2016-07-29 ENCOUNTER — Inpatient Hospital Stay (HOSPITAL_COMMUNITY): Payer: Medicare Other

## 2016-07-29 DIAGNOSIS — J9601 Acute respiratory failure with hypoxia: Secondary | ICD-10-CM

## 2016-07-29 DIAGNOSIS — J690 Pneumonitis due to inhalation of food and vomit: Secondary | ICD-10-CM

## 2016-07-29 DIAGNOSIS — I469 Cardiac arrest, cause unspecified: Secondary | ICD-10-CM

## 2016-07-29 DIAGNOSIS — I480 Paroxysmal atrial fibrillation: Secondary | ICD-10-CM

## 2016-07-29 LAB — POCT I-STAT, CHEM 8
BUN: 36 mg/dL — ABNORMAL HIGH (ref 6–20)
BUN: 35 mg/dL — AB (ref 6–20)
BUN: 34 mg/dL — AB (ref 6–20)
CALCIUM ION: 1.06 mmol/L — AB (ref 1.15–1.40)
CALCIUM ION: 1.08 mmol/L — AB (ref 1.15–1.40)
CALCIUM ION: 1.05 mmol/L — AB (ref 1.15–1.40)
CHLORIDE: 102 mmol/L (ref 101–111)
CREATININE: 1.9 mg/dL — AB (ref 0.44–1.00)
CREATININE: 1.9 mg/dL — AB (ref 0.44–1.00)
Chloride: 104 mmol/L (ref 101–111)
Chloride: 104 mmol/L (ref 101–111)
Creatinine, Ser: 1.8 mg/dL — ABNORMAL HIGH (ref 0.44–1.00)
GLUCOSE: 89 mg/dL (ref 65–99)
GLUCOSE: 58 mg/dL — AB (ref 65–99)
GLUCOSE: 81 mg/dL (ref 65–99)
HCT: 30 % — ABNORMAL LOW (ref 36.0–46.0)
HCT: 27 % — ABNORMAL LOW (ref 36.0–46.0)
HEMATOCRIT: 29 % — AB (ref 36.0–46.0)
HEMOGLOBIN: 9.9 g/dL — AB (ref 12.0–15.0)
Hemoglobin: 10.2 g/dL — ABNORMAL LOW (ref 12.0–15.0)
Hemoglobin: 9.2 g/dL — ABNORMAL LOW (ref 12.0–15.0)
Potassium: 3.8 mmol/L (ref 3.5–5.1)
Potassium: 3.6 mmol/L (ref 3.5–5.1)
Potassium: 3.6 mmol/L (ref 3.5–5.1)
SODIUM: 143 mmol/L (ref 135–145)
Sodium: 144 mmol/L (ref 135–145)
Sodium: 142 mmol/L (ref 135–145)
TCO2: 26 mmol/L (ref 0–100)
TCO2: 29 mmol/L (ref 0–100)
TCO2: 25 mmol/L (ref 0–100)

## 2016-07-29 LAB — BASIC METABOLIC PANEL
ANION GAP: 9 (ref 5–15)
ANION GAP: 11 (ref 5–15)
ANION GAP: 14 (ref 5–15)
Anion gap: 12 (ref 5–15)
BUN: 37 mg/dL — ABNORMAL HIGH (ref 6–20)
BUN: 37 mg/dL — ABNORMAL HIGH (ref 6–20)
BUN: 38 mg/dL — ABNORMAL HIGH (ref 6–20)
BUN: 40 mg/dL — AB (ref 6–20)
CALCIUM: 7.7 mg/dL — AB (ref 8.9–10.3)
CO2: 24 mmol/L (ref 22–32)
CO2: 25 mmol/L (ref 22–32)
CO2: 23 mmol/L (ref 22–32)
CO2: 21 mmol/L — ABNORMAL LOW (ref 22–32)
CREATININE: 1.89 mg/dL — AB (ref 0.44–1.00)
Calcium: 7.6 mg/dL — ABNORMAL LOW (ref 8.9–10.3)
Calcium: 7.7 mg/dL — ABNORMAL LOW (ref 8.9–10.3)
Calcium: 7.7 mg/dL — ABNORMAL LOW (ref 8.9–10.3)
Chloride: 104 mmol/L (ref 101–111)
Chloride: 105 mmol/L (ref 101–111)
Chloride: 103 mmol/L (ref 101–111)
Chloride: 102 mmol/L (ref 101–111)
Creatinine, Ser: 1.99 mg/dL — ABNORMAL HIGH (ref 0.44–1.00)
Creatinine, Ser: 2.26 mg/dL — ABNORMAL HIGH (ref 0.44–1.00)
Creatinine, Ser: 2.38 mg/dL — ABNORMAL HIGH (ref 0.44–1.00)
GFR calc Af Amer: 29 mL/min — ABNORMAL LOW (ref >60)
GFR calc Af Amer: 23 mL/min — ABNORMAL LOW (ref >60)
GFR calc non Af Amer: 26 mL/min — ABNORMAL LOW (ref >60)
GFR, EST AFRICAN AMERICAN: 30 mL/min — AB (ref >60)
GFR, EST AFRICAN AMERICAN: 24 mL/min — AB (ref >60)
GFR, EST NON AFRICAN AMERICAN: 25 mL/min — AB (ref >60)
GFR, EST NON AFRICAN AMERICAN: 21 mL/min — AB (ref >60)
GFR, EST NON AFRICAN AMERICAN: 20 mL/min — AB (ref >60)
GLUCOSE: 93 mg/dL (ref 65–99)
Glucose, Bld: 130 mg/dL — ABNORMAL HIGH (ref 65–99)
Glucose, Bld: 141 mg/dL — ABNORMAL HIGH (ref 65–99)
Glucose, Bld: 179 mg/dL — ABNORMAL HIGH (ref 65–99)
POTASSIUM: 5.1 mmol/L (ref 3.5–5.1)
POTASSIUM: 5.6 mmol/L — AB (ref 3.5–5.1)
POTASSIUM: 5.5 mmol/L — AB (ref 3.5–5.1)
Potassium: 3.9 mmol/L (ref 3.5–5.1)
SODIUM: 139 mmol/L (ref 135–145)
SODIUM: 137 mmol/L (ref 135–145)
SODIUM: 137 mmol/L (ref 135–145)
Sodium: 140 mmol/L (ref 135–145)

## 2016-07-29 LAB — CBC
HCT: 31.4 % — ABNORMAL LOW (ref 36.0–46.0)
Hemoglobin: 9.8 g/dL — ABNORMAL LOW (ref 12.0–15.0)
MCH: 28.2 pg (ref 26.0–34.0)
MCHC: 31.2 g/dL (ref 30.0–36.0)
MCV: 90.5 fL (ref 78.0–100.0)
Platelets: 130 10*3/uL — ABNORMAL LOW (ref 150–400)
RBC: 3.47 MIL/uL — ABNORMAL LOW (ref 3.87–5.11)
RDW: 14.8 % (ref 11.5–15.5)
WBC: 17.6 10*3/uL — ABNORMAL HIGH (ref 4.0–10.5)

## 2016-07-29 LAB — COOXEMETRY PANEL
Carboxyhemoglobin: 0.9 % (ref 0.5–1.5)
METHEMOGLOBIN: 1.3 % (ref 0.0–1.5)
O2 Saturation: 94.2 %
TOTAL HEMOGLOBIN: 10.5 g/dL — AB (ref 12.0–16.0)

## 2016-07-29 LAB — HEPARIN LEVEL (UNFRACTIONATED): HEPARIN UNFRACTIONATED: 1.32 [IU]/mL — AB (ref 0.30–0.70)

## 2016-07-29 LAB — POCT I-STAT 3, ART BLOOD GAS (G3+)
ACID-BASE DEFICIT: 6 mmol/L — AB (ref 0.0–2.0)
Acid-base deficit: 3 mmol/L — ABNORMAL HIGH (ref 0.0–2.0)
BICARBONATE: 22.3 mmol/L (ref 20.0–28.0)
Bicarbonate: 23.6 mmol/L (ref 20.0–28.0)
O2 SAT: 97 %
O2 Saturation: 89 %
PH ART: 7.307 — AB (ref 7.350–7.450)
TCO2: 24 mmol/L (ref 0–100)
TCO2: 25 mmol/L (ref 0–100)
pCO2 arterial: 47.1 mmHg (ref 32.0–48.0)
pCO2 arterial: 53.8 mmHg — ABNORMAL HIGH (ref 32.0–48.0)
pH, Arterial: 7.225 — ABNORMAL LOW (ref 7.350–7.450)
pO2, Arterial: 67 mmHg — ABNORMAL LOW (ref 83.0–108.0)
pO2, Arterial: 94 mmHg (ref 83.0–108.0)

## 2016-07-29 LAB — GLUCOSE, CAPILLARY
GLUCOSE-CAPILLARY: 110 mg/dL — AB (ref 65–99)
GLUCOSE-CAPILLARY: 164 mg/dL — AB (ref 65–99)
GLUCOSE-CAPILLARY: 57 mg/dL — AB (ref 65–99)
GLUCOSE-CAPILLARY: 59 mg/dL — AB (ref 65–99)
GLUCOSE-CAPILLARY: 77 mg/dL (ref 65–99)
GLUCOSE-CAPILLARY: 78 mg/dL (ref 65–99)
GLUCOSE-CAPILLARY: 84 mg/dL (ref 65–99)
GLUCOSE-CAPILLARY: 85 mg/dL (ref 65–99)
GLUCOSE-CAPILLARY: 86 mg/dL (ref 65–99)
Glucose-Capillary: 184 mg/dL — ABNORMAL HIGH (ref 65–99)
Glucose-Capillary: 54 mg/dL — ABNORMAL LOW (ref 65–99)
Glucose-Capillary: 57 mg/dL — ABNORMAL LOW (ref 65–99)
Glucose-Capillary: 58 mg/dL — ABNORMAL LOW (ref 65–99)
Glucose-Capillary: 63 mg/dL — ABNORMAL LOW (ref 65–99)
Glucose-Capillary: 65 mg/dL (ref 65–99)
Glucose-Capillary: 73 mg/dL (ref 65–99)
Glucose-Capillary: 79 mg/dL (ref 65–99)
Glucose-Capillary: 81 mg/dL (ref 65–99)
Glucose-Capillary: 93 mg/dL (ref 65–99)

## 2016-07-29 LAB — APTT
APTT: 126 s — AB (ref 24–36)
APTT: 82 s — AB (ref 24–36)
APTT: 63 s — AB (ref 24–36)
aPTT: 57 seconds — ABNORMAL HIGH (ref 24–36)

## 2016-07-29 LAB — ECHOCARDIOGRAM LIMITED: WEIGHTICAEL: 6092.8 [oz_av]

## 2016-07-29 LAB — CG4 I-STAT (LACTIC ACID): Lactic Acid, Venous: 1.58 mmol/L (ref 0.5–1.9)

## 2016-07-29 MED ORDER — MIDAZOLAM HCL 2 MG/2ML IJ SOLN
1.0000 mg | INTRAMUSCULAR | Status: DC | PRN
  Administered 2016-07-29 – 2016-07-31 (×6): 1 mg via INTRAVENOUS
  Filled 2016-07-29 (×7): qty 2

## 2016-07-29 MED ORDER — DEXTROSE 50 % IV SOLN
INTRAVENOUS | Status: AC
  Administered 2016-07-29: 18 mL
  Filled 2016-07-29: qty 50

## 2016-07-29 MED ORDER — DEXTROSE 5 % IV SOLN
INTRAVENOUS | Status: DC
  Administered 2016-07-29: 500 mL via INTRAVENOUS
  Administered 2016-07-29: 05:00:00 via INTRAVENOUS

## 2016-07-29 MED ORDER — ALBUTEROL SULFATE (2.5 MG/3ML) 0.083% IN NEBU
2.5000 mg | INHALATION_SOLUTION | Freq: Four times a day (QID) | RESPIRATORY_TRACT | Status: DC
  Administered 2016-07-29 – 2016-08-01 (×12): 2.5 mg via RESPIRATORY_TRACT
  Filled 2016-07-29 (×12): qty 3

## 2016-07-29 MED ORDER — MIDAZOLAM HCL 2 MG/2ML IJ SOLN
INTRAMUSCULAR | Status: AC
  Filled 2016-07-29: qty 2

## 2016-07-29 MED ORDER — SODIUM CHLORIDE 0.9 % IV SOLN
500.0000 mg | Freq: Two times a day (BID) | INTRAVENOUS | Status: DC
  Administered 2016-07-29 – 2016-07-31 (×4): 500 mg via INTRAVENOUS
  Filled 2016-07-29 (×4): qty 5

## 2016-07-29 MED ORDER — POTASSIUM CHLORIDE 20 MEQ/15ML (10%) PO SOLN: 40.0000 meq | Freq: Once | ORAL | Status: DC

## 2016-07-29 MED ORDER — SODIUM CHLORIDE 3 % IN NEBU
4.0000 mL | INHALATION_SOLUTION | Freq: Every day | RESPIRATORY_TRACT | Status: AC
  Administered 2016-07-29 – 2016-07-31 (×3): 4 mL via RESPIRATORY_TRACT
  Filled 2016-07-29 (×3): qty 4

## 2016-07-29 MED ORDER — FUROSEMIDE 10 MG/ML IJ SOLN: 40.0000 mg | Freq: Once | INTRAMUSCULAR | Status: DC

## 2016-07-29 MED ORDER — POTASSIUM CHLORIDE 20 MEQ PO PACK
40.0000 meq | PACK | Freq: Once | ORAL | Status: AC
  Administered 2016-07-29: 40 meq via ORAL
  Filled 2016-07-29: qty 2

## 2016-07-29 MED ORDER — FUROSEMIDE 10 MG/ML IJ SOLN
40.0000 mg | Freq: Once | INTRAMUSCULAR | Status: AC
  Administered 2016-07-29: 40 mg via INTRAVENOUS
  Filled 2016-07-29: qty 4

## 2016-07-29 MED ORDER — HEPARIN (PORCINE) IN NACL 100-0.45 UNIT/ML-% IJ SOLN
1250.0000 [IU]/h | INTRAMUSCULAR | Status: DC
  Administered 2016-07-29: 950 [IU]/h via INTRAVENOUS
  Administered 2016-07-30: 1250 [IU]/h via INTRAVENOUS
  Filled 2016-07-29 (×2): qty 250

## 2016-07-29 MED ORDER — VASOPRESSIN 20 UNIT/ML IV SOLN
0.0300 [IU]/min | INTRAVENOUS | Status: DC
  Administered 2016-07-29: 0.03 [IU]/min via INTRAVENOUS
  Filled 2016-07-29 (×2): qty 2

## 2016-07-29 NOTE — Progress Notes (Signed)
eLink Physician-Brief Progress Note Patient Name: Nancy West DOB: 1945-09-11 MRN: [id number]   Date of Service  07/29/2016  HPI/Events of Note  Hypotension - BP = 98/50 with MAP = 62. Currently on a Norepinephrine IV infusion at ceiling dose.  CVP = 16 and Hgb = 9.8.  eICU Interventions  Will order: 1. ABG now. 2. COOX now. 3. Vasopressin IV infusion at shock dose.      Intervention Category Major Interventions: Hypotension - evaluation and management  Sommer,Steven Eugene 07/29/2016, 6:24 PM

## 2016-07-29 NOTE — Progress Notes (Signed)
Patient had two more episodes of hypoglycemia with coverage of D50 for each episode.  Elink MD notified and patient orders placed for continuous D5 will be followed.  RN will continue to check BG regularly.   Johnnye Sima RN

## 2016-07-29 NOTE — Procedures (Signed)
Arterial Catheter Insertion Procedure Note KIEL EQUIHUA [id number] [birth year]  Procedure: Insertion of Arterial Catheter  Indications: Blood pressure monitoring  Procedure Details Consent: Unable to obtain consent because of emergent medical necessity. Pt other line came out and on pressors so new line reinserted. Time Out: Verified patient identification, verified procedure, site/side was marked, verified correct patient position, special equipment/implants available, medications/allergies/relevent history reviewed, required imaging and test results available.  Performed  Maximum sterile technique was used including antiseptics, sterile gown, mask, drape, gloves,mask head covering. Skin prep: Chlorhexidine; local anesthetic administered 22 gauge catheter was inserted into right radial artery using the Seldinger technique.  Evaluation Blood flow good; BP tracing good. Complications: No apparent complications.   Elbert Ewings [place] 07/29/2016

## 2016-07-29 NOTE — Progress Notes (Signed)
ANTICOAGULATION CONSULT NOTE  Pharmacy Consult for Heparin Indication: chest pain/ACS  Allergies  Allergen Reactions  . Augmentin [Amoxicillin-Pot Clavulanate] Other (See Comments)    Abdominal pain   Vital Signs: Temp: 99.1 F (37.3 C) (02/04 2000) Temp Source: Core (Comment) (02/04 2000) BP: 114/58 (02/04 2300) Pulse Rate: 81 (02/04 2300)  Labs:  Recent Labs  07/26/2016 2315  07/28/16 0056  07/28/16 0500  07/28/16 0836  07/28/16 1307  07/28/16 1910  07/29/16 0216 07/29/16 0614 07/29/16 0812  07/29/16 1415 07/29/16 1600 07/29/16 1830 07/29/16 2200 07/29/16 2222  HGB 11.0*  < >  --   < > 10.2*  < >  --   < >  --   < >  --   < > 9.2* 9.9*  --   --   --  9.8*  --   --   --   HCT 36.8  < >  --   < > 34.3*  < >  --   < >  --   < >  --   < > 27.0* 29.0*  --   --   --  31.4*  --   --   --   PLT 207  --   --   --  233  --   --   --   --   --   --   --   --   --   --   --   --  130*  --   --   --   APTT  --   < > 37*  --   --   --  36  --   --   --   --   < >  --   --  126*  --   --  82*  --  63*  --   LABPROT  --   --  28.3*  --   --   --  25.7*  --   --   --   --   --   --   --   --   --   --   --   --   --   --   INR  --   --  2.59  --   --   --  2.30  --   --   --   --   --   --   --   --   --   --   --   --   --   --   HEPARINUNFRC  --   --   --   --  >2.20*  --   --   --   --   --   --   --   --   --   --   --   --  1.32*  --   --   --   CREATININE 1.84*  < > 1.35*  < > 1.78*  < >  --   < >  --   < >  --   < > 1.90* 1.80*  --   < > 1.99*  --  2.26*  --  2.38*  TROPONINI  --   < > 0.34*  --  0.64*  --   --   --  0.35*  --  0.24*  --   --   --   --   --   --   --   --   --   --   < > =  values in this interval not displayed.  Estimated Creatinine Clearance: 38.4 mL/min (by C-G formula based on SCr of 2.38 mg/dL (H)).  Assessment: 71 y/o Female w/ hx afib s/p cardiac arrest and hypothermia protocol, now rewarming, for heparin Goal of Therapy:  Heparin level 0.3-0.7  units/ml aPTT 66-102 seconds Monitor platelets by anticoagulation protocol: Yes   Plan:  Increase Heparin 1250 units/hr Follow-up am labs.  Geannie Risen, PharmD, BCPS  07/29/2016 11:26 PM

## 2016-07-29 NOTE — Progress Notes (Signed)
eLink Physician-Brief Progress Note Patient Name: Nancy West DOB: 1946-03-09 MRN: [id number]   Date of Service  07/29/2016  HPI/Events of Note  Persistent hypoglycemia in the setting of prior insulin gtt and adminstration of 15 units of lantus  eICU Interventions  Plan: D/C lantus for now D5W at 30 ml/hr Continue to monitor blood sugar.       Intervention Category Intermediate Interventions: Other:  Zigmund Linse 07/29/2016, 5:10 AM

## 2016-07-29 NOTE — Progress Notes (Signed)
PULMONARY / CRITICAL CARE MEDICINE   Name: Nancy West MRN: [id number] DOB: 12-01-1945    ADMISSION DATE:  2016-07-29 CONSULTATION DATE:  07/29/2016  REFERRING MD:  Dr. Abigail Miyamoto  CHIEF COMPLAINT:  PEA arrest  HISTORY OF PRESENT ILLNESS:   Nancy West is a 71 year old female with a significant past medical history of atrial fibrillation/flutter, hypertension, type II diabetes, mild systolic heart failure, prior pericardial requiring pericardiocentesis, tobacco abuse, COPD with chronic hypoxia requiring supplemental oxygen, obstructive sleep apnea, morbid obesity, and hypothyroidism who was sent in today after being found down at home due to cardiac arrest and underlying rhythm being ventricular fibrillation.     She was admitted to the cardiac ICU for the hypothermia protocol.  SUBJECTIVE:  Keeps having episodes of desaturation   VITAL SIGNS: BP (!) 140/51   Pulse 76   Temp (!) 94.1 F (34.5 C)   Resp 16   SpO2 91%   HEMODYNAMICS: CVP:  [12 mmHg-59 mmHg] 15 mmHg  VENTILATOR SETTINGS: Vent Mode: PRVC FiO2 (%):  [50 %-80 %] 50 % Set Rate:  [26 bmp] 26 bmp Vt Set:  [510 mL] 510 mL PEEP:  [12 cmH20-14 cmH20] 12 cmH20 Pressure Support:  [12 cmH20] 12 cmH20 Plateau Pressure:  [25 cmH20-28 cmH20] 28 cmH20  INTAKE / OUTPUT:  Intake/Output Summary (Last 24 hours) at 07/29/16 1136 Last data filed at 07/29/16 1000  Gross per 24 hour  Intake          5526.06 ml  Output              930 ml  Net          4596.06 ml    PHYSICAL EXAMINATION: General appearance:  71 Year old  female, well nourished NAD, sedated Eyes: anicteric sclerae , moist conjunctivae; PERRL, EOMI bilaterally. Mouth:  membranes and no mucosal ulcerations; normal hard and soft palate, orally intubated Neck: Trachea midline; neck supple, no JVD Lungs/chest: decreased t/o, right >L, with normal respiratory effort and no intercostal retractions CV: RRR, no MRGs  Abdomen: Soft, non-tender; no masses or  HSM Extremities: No peripheral edema or extremity lymphadenopathy Skin: Normal temperature, turgor and texture; no rash, ulcers or subcutaneous nodules Psych: Appropriate affect, alert and oriented to person, place and time  LABS:  BMET  Recent Labs Lab 07/28/16 0056  07/28/16 0500  07/29/16 0216 07/29/16 0614 07/29/16 1014  NA 145  < > 142  < > 143 142 140  K 2.8*  < > 3.2*  < > 3.6 3.6 3.9  CL 110  < > 102  < > 104 104 104  CO2 24  --  24  --   --   --  24  BUN 28*  < > 34*  < > 35* 34* 37*  CREATININE 1.35*  < > 1.78*  < > 1.90* 1.80* 1.89*  GLUCOSE 295*  < > 380*  < > 58* 81 93  < > = values in this interval not displayed.  Electrolytes  Recent Labs Lab 2016/07/29 2315 07/28/16 0056 07/28/16 0500 07/28/16 2120 07/29/16 1014  CALCIUM 9.9 7.2* 8.4*  --  7.7*  MG 2.0  --  1.8 1.4*  --   PHOS 6.9*  --  5.3*  --   --     CBC  Recent Labs Lab 07/23/16 1745 29-Jul-2016 2315  07/28/16 0500  07/28/16 2233 07/29/16 0216 07/29/16 0614  WBC 12.2* 27.4*  --  32.7*  --   --   --   --  HGB 11.3* 11.0*  < > 10.2*  < > 10.2* 9.2* 9.9*  HCT 37.7 36.8  < > 34.3*  < > 30.0* 27.0* 29.0*  PLT 248 207  --  233  --   --   --   --   < > = values in this interval not displayed.  Coag's  Recent Labs Lab 07/28/16 0056 07/28/16 0836 07/28/16 2351 07/29/16 0812  APTT 37* 36 57* 126*  INR 2.59 2.30  --   --     Sepsis Markers  Recent Labs Lab 08/19/2016 2326  LATICACIDVEN 7.87*    ABG  Recent Labs Lab 07/28/16 0059 07/28/16 0436  PHART 7.198* 7.280*  PCO2ART 77.4* 49.0*  PO2ART 61.0* 208*    Liver Enzymes  Recent Labs Lab 07/23/16 1745 08/17/2016 2315  AST 51* 45*  ALT 24 27  ALKPHOS 69 80  BILITOT 1.4* 0.7  ALBUMIN 3.8 3.5    Cardiac Enzymes  Recent Labs Lab 07/28/16 0500 07/28/16 1307 07/28/16 1910  TROPONINI 0.64* 0.35* 0.24*    Glucose  Recent Labs Lab 07/29/16 0345 07/29/16 0449 07/29/16 0512 07/29/16 0611 07/29/16 0714  07/29/16 0811  GLUCAP 86 58* 93 78 73 81    Imaging No results found. Pcxr: right > left airspace disease.   STUDIES:  Head CT 2/2 - no acute findings  CULTURES: Blood 2/2  ANTIBIOTICS: Vanc 2/2 Zosyn 2/2  SIGNIFICANT EVENTS:   LINES/TUBES: ETT - 2/2 CVC left subclavian - 2/2   ASSESSMENT / PLAN: Pulmonary  Acute hypoxic respiratory failure s/p cardiopulmonary arrest  Right > left pulmonary infiltrates ? Aspiration vs edema Respiratory acidosis  Plan Full vent support Weaning peep/fio2 F/u cbc Add BDs and HT saline F/u am cxr and abg  Cardiology PEA arrest -->Tikosyn related  H/o AF Persistent hypotension in setting of vasodilation from hypothermia  Mild CM (EF 40-45% prior ECHO) and diastolic dysfxn  Plan Levophed per protocol  F/u echo No more tikosyn Lasix x 1 per cards Cont heparin gtt  Renal AKI. Creatinine a little worse.  Plan Renal dose meds Strict I&O F/u am labs  Infectious disease Aspiration PNA Plan Cont current vanc/zosyn, narrow as cultures and sensitivities dictate   Neuro  anoxic encephalopathy; likely  -eeg w/ burst suppression  Plan Cont hypothermia protocol  For rewarming at 1700 Will likely need goals of care discussion  FAMILY  - Updates:   Summary Sedated on vent and on hypothermia protocol.  Only issues over night have been occasional desaturation Getting lasix w/ elevated CVP (from cards) EEG was negative for seizure but + burst suppression  Prognosis for meaningful recovery poor Will cont current rx   My critical care time 34 minutes  Simonne Martinet ACNP-BC [facility] Pulmonary/Critical Care Pager # [phone] OR # [phone] if no answer  07/29/2016, 11:35 AM   ATTENDING NOTE / ATTESTATION NOTE :   I have discussed the case with the resident/APP  Anders Simmonds NP.  I agree with the resident/APP's  history, physical examination, assessment, and plans.    I have edited the above note and modified it  according to our agreed history, physical examination, assessment and plan.   Nancy West is a 71 year old female with a significant past medical history of atrial fibrillation/flutter, hypertension, type II diabetes, mild systolic heart failure, prior pericardial requiring pericardiocentesis, tobacco abuse, COPD with chronic hypoxia requiring supplemental oxygen, obstructive sleep apnea, morbid obesity, and hypothyroidism who was sent in today after being found  down at home due to cardiac arrest and underlying rhythm being ventricular fibrillation.   On hypothermia protocol. Sedated.   Vitals:  Vitals:   07/29/16 1521 07/29/16 1545 07/29/16 1600 07/29/16 1700  BP: (!) 162/62 (!) 111/39    Pulse: 93 83 66 73  Resp: (!) 26 (!) 0 (!) 0 17  Temp:      TempSrc:      SpO2: 92% 93% 92% 90%    Constitutional/General: well-nourished, well-developed, intubated, sedated, not in any distress  There is no height or weight on file to calculate BMI. Wt Readings from Last 3 Encounters:  07/29/2016 (!) 172.7 kg (380 lb 12.8 oz)  07/17/16 (!) 175.1 kg (386 lb 1.6 oz)  05/14/16 (!) 175.4 kg (386 lb 9.6 oz)    HEENT: PERLA, anicteric sclerae. (-) Oral thrush. Intubated, ETT in place. Morbidly obese.   Neck: No masses. Midline trachea. No JVD, (-) LAD. (-) bruits appreciated.  Respiratory/Chest: Grossly normal chest. (-) deformity. (-) Accessory muscle use.  Symmetric expansion. Diminished BS on both lower lung zones. (-) wheezing, rhonchi. Crackles bibasilar (-) egophony  Cardiovascular: Regular rate and  rhythm, heart sounds normal, no murmur or gallops,  Gr 2  peripheral edema  Gastrointestinal:  Decreased  bowel sounds. Soft, non-tender. No hepatosplenomegaly.  (-) masses.   Musculoskeletal:  Normal muscle tone.   Extremities: Grossly normal. (-) clubbing, cyanosis.  Gr 2  edema  Skin: (-) rash,lesions seen.   Neurological/Psychiatric : sedated, intubated. CN grossly intact. (-)  lateralizing signs.   CBC Recent Labs     08/20/2016  2315   07/28/16  0500   07/29/16  0216  07/29/16  0614  07/29/16  1600  WBC  27.4*   --   32.7*   --    --    --   17.6*  HGB  11.0*   < >  10.2*   < >  9.2*  9.9*  9.8*  HCT  36.8   < >  34.3*   < >  27.0*  29.0*  31.4*  PLT  207   --   233   --    --    --   130*   < > = values in this interval not displayed.    Coag's Recent Labs     07/28/16  0056  07/28/16  0836  07/28/16  2351  07/29/16  0812  07/29/16  1600  APTT  37*  36  57*  126*  82*  INR  2.59  2.30   --    --    --     BMET Recent Labs     07/28/16  0500   07/29/16  0614  07/29/16  1014  07/29/16  1415  NA  142   < >  142  140  139  K  3.2*   < >  3.6  3.9  5.1  CL  102   < >  104  104  105  CO2  24   --    --   24  25  BUN  34*   < >  34*  37*  37*  CREATININE  1.78*   < >  1.80*  1.89*  1.99*  GLUCOSE  380*   < >  81  93  130*   < > = values in this interval not displayed.    Electrolytes Recent Labs     07/31/2016  2315  07/28/16  0500  07/28/16  2120  07/29/16  1014  07/29/16  1415  CALCIUM  9.9   < >  8.4*   --   7.7*  7.6*  MG  2.0   --   1.8  1.4*   --    --   PHOS  6.9*   --   5.3*   --    --    --    < > = values in this interval not displayed.    Sepsis Markers No results for input(s): PROCALCITON, O2SATVEN in the last 72 hours.  Invalid input(s): LACTICACIDVEN  ABG Recent Labs     07/28/16  0059  07/28/16  0436  PHART  7.198*  7.280*  PCO2ART  77.4*  49.0*  PO2ART  61.0*  208*    Liver Enzymes Recent Labs     August 01, 2016  2315  AST  45*  ALT  27  ALKPHOS  80  BILITOT  0.7  ALBUMIN  3.5    Cardiac Enzymes Recent Labs     07/28/16  0500  07/28/16  1307  07/28/16  1910  TROPONINI  0.64*  0.35*  0.24*    Glucose Recent Labs     07/29/16  0449  07/29/16  0512  07/29/16  0611  07/29/16  0714  07/29/16  0811  07/29/16  1141  GLUCAP  58*  93  78  73  81  110*    Imaging Dg Abd 1 View  Result  Date: 07/28/2016 CLINICAL DATA:  OG tube placement EXAM: ABDOMEN - 1 VIEW COMPARISON:  None FINDINGS: The orogastric tube extends into the stomach with tip in the region of the fundus. IMPRESSION: Orogastric tube extends well into the stomach. Electronically Signed   By: Ellery Plunk M.D.   On: 07/28/2016 03:16   Ct Head Wo Contrast  Result Date: 07/28/2016 CLINICAL DATA:  Post arrest. EXAM: CT HEAD WITHOUT CONTRAST TECHNIQUE: Contiguous axial images were obtained from the base of the skull through the vertex without intravenous contrast. COMPARISON:  None. FINDINGS: Brain: There is no intracranial hemorrhage, mass or evidence of acute infarction. There is mild generalized atrophy. There is mild chronic microvascular ischemic change. There is no significant extra-axial fluid collection. No acute intracranial findings are evident. Vascular: No hyperdense vessel or unexpected calcification. Skull: Normal. Negative for fracture or focal lesion. Sinuses/Orbits: No acute finding. Other: Left frontal scalp hematoma. IMPRESSION: Left frontal scalp hematoma. No acute intracranial findings. There is moderate generalized atrophy and chronic appearing white matter hypodensities which likely represent small vessel ischemic disease. Electronically Signed   By: Ellery Plunk M.D.   On: 07/28/2016 00:26   Dg Chest Port 1 View  Result Date: 07/29/2016 CLINICAL DATA:  Pneumonia EXAM: PORTABLE CHEST 1 VIEW COMPARISON:  07/28/2016; 08/13/2016; 07/14/2016; chest CT- 07/14/2016 FINDINGS: Grossly unchanged enlarged cardiac silhouette and mediastinal contours. Stable position of support apparatus. No supine evidence of pneumothorax. Minimally improved aeration of lungs with persistent ill-defined nodular airspace opacities about the bilateral hila, right greater than left. No new focal airspace opacities. No evidence of edema. No acute osseus abnormalities. IMPRESSION: 1.  Stable positioning of support apparatus.  No  pneumothorax. 2. Minimally improved aeration lungs with persistent ill-defined nodular airspace opacities about the bilateral hila, right greater than left with differential considerations including (but not limited to) multifocal infection/aspiration and asymmetric pulmonary edema. Continued attention on follow-up is recommended. Electronically Signed   By: Holland Commons.D.  On: 07/29/2016 14:27   Dg Chest Port 1 View  Result Date: 07/28/2016 CLINICAL DATA:  Central line placement EXAM: PORTABLE CHEST 1 VIEW COMPARISON:  08/11/2016 FINDINGS: There is a new left subclavian central line extending to the SVC. No pneumothorax. Endotracheal tube tip is 3 cm above the carina. Nasogastric tube extends below the diaphragm and beyond the inferior edge of the image. Airspace opacity persists in the right upper lobe. IMPRESSION: 1. Support equipment appears satisfactorily positioned. New left subclavian central line. No pneumothorax. 2. Persistent airspace opacity in the right upper lobe. Electronically Signed   By: Ellery Plunk M.D.   On: 07/28/2016 02:53   Dg Chest Port 1 View  Result Date: 08/08/2016 CLINICAL DATA:  Emergent intubation EXAM: PORTABLE CHEST 1 VIEW COMPARISON:  07/23/2016 FINDINGS: Endotracheal tube tip is approximately 1.6 cm above the carina. Confluent airspace opacity in the right upper lobe could be infectious. Pulmonary hemorrhage or aspiration are also possibilities. Left lung is grossly clear. No large pneumothorax. IMPRESSION: 1. Satisfactorily positioned ETT. 2. Consolidation in the right upper lobe, possibly pneumonia but aspiration or hemorrhage could also produce this appearance. Electronically Signed   By: Ellery Plunk M.D.   On: 07/31/2016 23:34   Assessment/Plan :  S/P cardiac arrest, Vfib, likely tikosyn related - cont hypothermia protocol - Cardiology following. Appreciate input - will need MRI if she does not wake up - on cEEG  Acute hypoxemic resp failure 2/2  unable to protect the airway + asp pna - cont vent support - PST when able - cont vanc and zosyn  CHF EF 40-45%, Afib - holding off on most cardiac meds. On hypothermia protocol. On levophed > wean off - lasix x 1 dose - cont heparin drip  AKI - observe.  - making urine.   Concern for anoxic ischemic encephalopathy 2/2 cardiac arrest - assess neuro status post hypothermia - likely will need MRI.  EEG with burst suppression   I spent  30   minutes of Critical Care time with this patient today. This is my time spent independent of the APP or resident.   Family :Family updated at length today.  Updated husband via phone on 2/4.   Pollie Meyer, MD 07/29/2016, 5:31 PM [facility] Pager [phone] After 3 pm or if no answer, call [phone]

## 2016-07-29 NOTE — Progress Notes (Signed)
ANTICOAGULATION CONSULT NOTE - Follow Up Consult  Pharmacy Consult for Heparin (hold Xarelto) Indication: chest pain/ACS, s/p cardiac arrest with ROSC  Allergies  Allergen Reactions  . Augmentin [Amoxicillin-Pot Clavulanate] Other (See Comments)    Abdominal pain   Vital Signs: Temp: 91.6 F (33.1 C) (02/04 0000) Temp Source: Core (Comment) (02/04 0000) BP: 160/58 (02/03 1601) Pulse Rate: 49 (02/04 0000)  Labs:  Recent Labs  08/19/2016 2315  07/28/16 0056  07/28/16 0500  07/28/16 0836 07/28/16 1035 07/28/16 1307 07/28/16 1414 07/28/16 1906 07/28/16 1910 07/28/16 2351  HGB 11.0*  < >  --   < > 10.2*  < >  --  10.9*  --  10.9* 10.9*  --   --   HCT 36.8  < >  --   < > 34.3*  < >  --  32.0*  --  32.0* 32.0*  --   --   PLT 207  --   --   --  233  --   --   --   --   --   --   --   --   APTT  --   --  37*  --   --   --  36  --   --   --   --   --  57*  LABPROT  --   --  28.3*  --   --   --  25.7*  --   --   --   --   --   --   INR  --   --  2.59  --   --   --  2.30  --   --   --   --   --   --   HEPARINUNFRC  --   --   --   --  >2.20*  --   --   --   --   --   --   --   --   CREATININE 1.84*  < > 1.35*  < > 1.78*  < >  --  1.60*  --  1.70* 1.80*  --   --   TROPONINI  --   < > 0.34*  --  0.64*  --   --   --  0.35*  --   --  0.24*  --   < > = values in this interval not displayed.  Estimated Creatinine Clearance: 50.7 mL/min (by C-G formula based on SCr of 1.8 mg/dL (H)).  Assessment: 71 y/o F s/p cardiac arrest with ROSC on heparin while Xarelto on hold. On therapeutic hypothermia. Initial aPTT is low. Using aPTT to dose for now given Xarelto influence on anti-Xa levels. Pt is having some bleeding from gums with mouth care>>monitor. Pt will start re-warming at ~0400 today.  Goal of Therapy:  Heparin level 0.3-0.7 units/ml aPTT 66-102 seconds Monitor platelets by anticoagulation protocol: Yes   Plan:  -Inc heparin to 1050 units/hr -0900 aPTT  Abran Duke 07/29/2016,12:28 AM

## 2016-07-29 NOTE — Progress Notes (Signed)
ANTICOAGULATION CONSULT NOTE - Follow Up Consult  Pharmacy Consult for Heparin (hold Xarelto) Indication: chest pain/ACS, s/p cardiac arrest with ROSC  Allergies  Allergen Reactions  . Augmentin [Amoxicillin-Pot Clavulanate] Other (See Comments)    Abdominal pain   Vital Signs: Temp: 96.6 F (35.9 C) (02/04 1400) Temp Source: Core (Comment) (02/04 1400) BP: 111/39 (02/04 1545) Pulse Rate: 83 (02/04 1545)  Labs:  Recent Labs  08/02/2016 2315  07/28/16 0056  07/28/16 0500  07/28/16 0836  07/28/16 1307  07/28/16 1910  07/28/16 2351 07/29/16 0216 07/29/16 0614 07/29/16 0812 07/29/16 1014 07/29/16 1415 07/29/16 1600  HGB 11.0*  < >  --   < > 10.2*  < >  --   < >  --   < >  --   < >  --  9.2* 9.9*  --   --   --  9.8*  HCT 36.8  < >  --   < > 34.3*  < >  --   < >  --   < >  --   < >  --  27.0* 29.0*  --   --   --  31.4*  PLT 207  --   --   --  233  --   --   --   --   --   --   --   --   --   --   --   --   --  130*  APTT  --   < > 37*  --   --   --  36  --   --   --   --   --  57*  --   --  126*  --   --  82*  LABPROT  --   --  28.3*  --   --   --  25.7*  --   --   --   --   --   --   --   --   --   --   --   --   INR  --   --  2.59  --   --   --  2.30  --   --   --   --   --   --   --   --   --   --   --   --   HEPARINUNFRC  --   --   --   --  >2.20*  --   --   --   --   --   --   --   --   --   --   --   --   --   --   CREATININE 1.84*  < > 1.35*  < > 1.78*  < >  --   < >  --   < >  --   < >  --  1.90* 1.80*  --  1.89* 1.99*  --   TROPONINI  --   < > 0.34*  --  0.64*  --   --   --  0.35*  --  0.24*  --   --   --   --   --   --   --   --   < > = values in this interval not displayed.  Estimated Creatinine Clearance: 45.9 mL/min (by C-G formula based on SCr of 1.99 mg/dL (H)).  Assessment: 71 y/o F w/ hx  afib s/p cardiac arrest with ROSC on heparin while Xarelto on hold. On therapeutic hypothermia and now rewarming. -aPTT= 82 and at goal -CBC stable  Goal of Therapy:   Heparin level 0.3-0.7 units/ml aPTT 66-102 seconds Monitor platelets by anticoagulation protocol: Yes   Plan:  -No heparin changes needed -Will recheck a heparin level in 6 hours with rewarming -daily heparin level, aPTT and CBC  Harland German, Pharm D 07/29/2016 5:04 PM

## 2016-07-29 NOTE — Progress Notes (Signed)
Hypoglycemic Event  CBG: 54  Treatment: 18 mL d50   Follow-up CBG: Time:0115 CBG Result:77   Jeannie Fend

## 2016-07-29 NOTE — Progress Notes (Signed)
ANTICOAGULATION CONSULT NOTE - Follow Up Consult  Pharmacy Consult for Heparin (hold Xarelto) Indication: chest pain/ACS, s/p cardiac arrest with ROSC  Allergies  Allergen Reactions  . Augmentin [Amoxicillin-Pot Clavulanate] Other (See Comments)    Abdominal pain   Vital Signs: Temp: 92.8 F (33.8 C) (02/04 0800) Temp Source: Core (Comment) (02/04 0800) BP: 134/48 (02/04 0921) Pulse Rate: 79 (02/04 0921)  Labs:  Recent Labs  08/17/2016 2315  07/28/16 0056  07/28/16 0500  07/28/16 0836  07/28/16 1307  07/28/16 1910 07/28/16 2233 07/28/16 2351 07/29/16 0216 07/29/16 0614 07/29/16 0812  HGB 11.0*  < >  --   < > 10.2*  < >  --   < >  --   < >  --  10.2*  --  9.2* 9.9*  --   HCT 36.8  < >  --   < > 34.3*  < >  --   < >  --   < >  --  30.0*  --  27.0* 29.0*  --   PLT 207  --   --   --  233  --   --   --   --   --   --   --   --   --   --   --   APTT  --   < > 37*  --   --   --  36  --   --   --   --   --  57*  --   --  126*  LABPROT  --   --  28.3*  --   --   --  25.7*  --   --   --   --   --   --   --   --   --   INR  --   --  2.59  --   --   --  2.30  --   --   --   --   --   --   --   --   --   HEPARINUNFRC  --   --   --   --  >2.20*  --   --   --   --   --   --   --   --   --   --   --   CREATININE 1.84*  < > 1.35*  < > 1.78*  < >  --   < >  --   < >  --  1.90*  --  1.90* 1.80*  --   TROPONINI  --   < > 0.34*  --  0.64*  --   --   --  0.35*  --  0.24*  --   --   --   --   --   < > = values in this interval not displayed.  Estimated Creatinine Clearance: 50.7 mL/min (by C-G formula based on SCr of 1.8 mg/dL (H)).  Assessment: 71 y/o F w/ hx afib s/p cardiac arrest with ROSC on heparin while Xarelto on hold. On therapeutic hypothermia.  Using aPTT to dose for now given Xarelto influence on anti-Xa levels.  Pt started re-warming at ~0400 am today.  PTT this AM unexpectedly elevated.  No bleeding or complications noted.  CBC not checked this AM.  Goal of Therapy:  Heparin  level 0.3-0.7 units/ml aPTT 66-102 seconds Monitor platelets by anticoagulation protocol: Yes   Plan:  -Decrease IV heparin  to 950 units/hr. -Recheck PTT, heparin level and CBC at 4 PM today. -Daily PTT, heparin level and CBC. -F/u plans to resume oral anticoagulation once able.  Tad Moore, BCPS  Clinical Pharmacist Pager [phone]  07/29/2016 9:52 AM

## 2016-07-29 NOTE — Progress Notes (Signed)
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   eICU Interventions  Will order: 1. Versed 1 mg IV Q 1 hour PRN myoclonus or seizures.      Intervention Category Major Interventions: Other:  Nancy West 07/29/2016, 5:42 PM

## 2016-07-29 NOTE — Progress Notes (Signed)
eLink Physician-Brief Progress Note Patient Name: Nancy West DOB: 02-Apr-1946 MRN: [id number]   Date of Service  07/29/2016  HPI/Events of Note  ABG on 100%/PRVC 26/TV 510/P 12 = 7.225/53.0/67.5/22.3.  eICU Interventions  Will order: 1. Increase rate to 32. 2. PEEP already increased to 14. 3. ABG at 11 PM.      Intervention Category Major Interventions: Acid-Base disturbance - evaluation and management;Respiratory failure - evaluation and management  Sommer,Steven Dennard Nip 07/29/2016, 9:50 PM

## 2016-07-29 NOTE — Progress Notes (Addendum)
Hypoglycemic Event  CBG:57  Treatment: 17 mL of D50  Symptom:  Possible hypotension  Follow-up CBG: Time: 0220 CBG Result: [address]

## 2016-07-29 NOTE — Progress Notes (Addendum)
eLink Physician-Brief Progress Note Patient Name: Nancy West DOB: October 11, 1945 MRN: [id number]   Date of Service  07/29/2016  HPI/Events of Note  Mutiple issues: 1. Hypoxia - Sats = 80%. Ppeak = 31 and 2. 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 Presently on Ativan 1 mg IV Q 1 hour PRN seizures/myoclonus and a Propofol IV infusion. Nurse instructed to keep titrating Propofol IV infusion to it's ceiling.   eICU Interventions  Will order: 1. Keppra 500 mg IV now and Q 12 hours.  2. Portable CXR now. 3. Increase PEEP to 14. 4. ABG now.         Sommer,Steven Dennard Nip 07/29/2016, 9:14 PM

## 2016-07-29 NOTE — Progress Notes (Signed)
Patient ID: Nancy West, female   DOB: 1946/01/05, 71 y.o.   MRN: [id number]   SUBJECTIVE: Patient is intubated, sedated, cooled.   SBP 130s, HR 60s NSR. Now off epinephrine and on norepinephrine 18.  Decreased PVCs off epinephrine.  CVP 15.   Echo done yesterday evening, read is pending.   ECG 2/3 with NSR, LBBB, QTc 537 (cooled).   TnI 0.34 => 0.64 => 0.24  Scheduled Meds: . artificial tears  1 application Both Eyes N8G  . chlorhexidine gluconate (MEDLINE KIT)  15 mL Mouth Rinse BID  . Chlorhexidine Gluconate Cloth  6 each Topical Daily  . fentaNYL (SUBLIMAZE) injection  50 mcg Intravenous Once  . furosemide  40 mg Intravenous Once  . mouth rinse  15 mL Mouth Rinse 10 times per day  . pantoprazole (PROTONIX) IV  40 mg Intravenous QHS  . piperacillin-tazobactam (ZOSYN)  IV  3.375 g Intravenous Q8H  . potassium chloride  40 mEq Oral Once  . sodium chloride flush  10-40 mL Intracatheter Q12H  . vancomycin  1,750 mg Intravenous Q24H   Continuous Infusions: . sodium chloride 10 mL/hr at 07/29/16 0800  . sodium chloride 100 mL/hr at 07/29/16 0800  . cisatracurium (NIMBEX) infusion 1.5 mcg/kg/min (07/29/16 0800)  . dextrose 30 mL/hr at 07/29/16 0800  . epinephrine Stopped (07/28/16 2019)  . fentaNYL infusion INTRAVENOUS 100 mcg/hr (07/29/16 0800)  . heparin 1,050 Units/hr (07/29/16 0800)  . norepinephrine (LEVOPHED) Adult infusion 18 mcg/min (07/29/16 0830)  . propofol (DIPRIVAN) infusion 25 mcg/kg/min (07/29/16 0830)   PRN Meds:.[COMPLETED] cisatracurium **AND** cisatracurium (NIMBEX) infusion **AND** cisatracurium, fentaNYL, fentaNYL (SUBLIMAZE) injection, sodium chloride flush  Vitals:   07/29/16 0630 07/29/16 0645 07/29/16 0700 07/29/16 0800  BP:      Pulse: (!) 51 (!) 54 (!) 55   Resp: (!) 26 (!) 26 (!) 26   Temp:   (!) 91.6 F (33.1 C) (!) 92.8 F (33.8 C)  TempSrc:   Core (Comment) Core (Comment)  SpO2: 96% 93% 95%     Intake/Output Summary (Last 24 hours) at  07/29/16 0844 Last data filed at 07/29/16 0800  Gross per 24 hour  Intake          5666.19 ml  Output              918 ml  Net          4748.19 ml    LABS: Basic Metabolic Panel:  Recent Labs  08/13/2016 2315  07/28/16 0056  07/28/16 0500  07/28/16 2120  07/29/16 0216 07/29/16 0614  NA 142  < > 145  < > 142  < >  --   < > 143 142  K 4.4  < > 2.8*  < > 3.2*  < >  --   < > 3.6 3.6  CL 97*  < > 110  < > 102  < >  --   < > 104 104  CO2 24  --  24  --  24  --   --   --   --   --   GLUCOSE 314*  < > 295*  < > 380*  < >  --   < > 58* 81  BUN 33*  < > 28*  < > 34*  < >  --   < > 35* 34*  CREATININE 1.84*  < > 1.35*  < > 1.78*  < >  --   < > 1.90* 1.80*  CALCIUM  9.9  --  7.2*  --  8.4*  --   --   --   --   --   MG 2.0  --   --   --  1.8  --  1.4*  --   --   --   PHOS 6.9*  --   --   --  5.3*  --   --   --   --   --   < > = values in this interval not displayed. Liver Function Tests:  Recent Labs  08/14/2016 2315  AST 45*  ALT 27  ALKPHOS 80  BILITOT 0.7  PROT 6.8  ALBUMIN 3.5   No results for input(s): LIPASE, AMYLASE in the last 72 hours. CBC:  Recent Labs  08/21/2016 2315  07/28/16 0500  07/29/16 0216 07/29/16 0614  WBC 27.4*  --  32.7*  --   --   --   NEUTROABS 22.8*  --   --   --   --   --   HGB 11.0*  < > 10.2*  < > 9.2* 9.9*  HCT 36.8  < > 34.3*  < > 27.0* 29.0*  MCV 95.8  --  94.8  --   --   --   PLT 207  --  233  --   --   --   < > = values in this interval not displayed. Cardiac Enzymes:  Recent Labs  07/28/16 0500 07/28/16 1307 07/28/16 1910  TROPONINI 0.64* 0.35* 0.24*   BNP: Invalid input(s): POCBNP D-Dimer: No results for input(s): DDIMER in the last 72 hours. Hemoglobin A1C: No results for input(s): HGBA1C in the last 72 hours. Fasting Lipid Panel: No results for input(s): CHOL, HDL, LDLCALC, TRIG, CHOLHDL, LDLDIRECT in the last 72 hours. Thyroid Function Tests: No results for input(s): TSH, T4TOTAL, T3FREE, THYROIDAB in the last 72  hours.  Invalid input(s): FREET3 Anemia Panel: No results for input(s): VITAMINB12, FOLATE, FERRITIN, TIBC, IRON, RETICCTPCT in the last 72 hours.  RADIOLOGY: Dg Chest 2 View  Result Date: 07/14/2016 CLINICAL DATA:  Pt reports sharp back pain in upper right shoulder blade yesterday that "punched through pt" lasting about 5 minutes. Pt states since then, she has felt shaky and had cold sweats EXAM: CHEST  2 VIEW COMPARISON:  Radiograph 10/31/2015 FINDINGS: Stable enlarged cardiac silhouette. Central venous pulmonary congestion increased from prior. New focus of consolidation. No pleural fluid. IMPRESSION: Cardiomegaly and central venous congestion suggest mild congestive heart failure Electronically Signed   By: Suzy Bouchard M.D.   On: 07/14/2016 14:17   Dg Abd 1 View  Result Date: 07/28/2016 CLINICAL DATA:  OG tube placement EXAM: ABDOMEN - 1 VIEW COMPARISON:  None FINDINGS: The orogastric tube extends into the stomach with tip in the region of the fundus. IMPRESSION: Orogastric tube extends well into the stomach. Electronically Signed   By: Andreas Newport M.D.   On: 07/28/2016 03:16   Ct Head Wo Contrast  Result Date: 07/28/2016 CLINICAL DATA:  Post arrest. EXAM: CT HEAD WITHOUT CONTRAST TECHNIQUE: Contiguous axial images were obtained from the base of the skull through the vertex without intravenous contrast. COMPARISON:  None. FINDINGS: Brain: There is no intracranial hemorrhage, mass or evidence of acute infarction. There is mild generalized atrophy. There is mild chronic microvascular ischemic change. There is no significant extra-axial fluid collection. No acute intracranial findings are evident. Vascular: No hyperdense vessel or unexpected calcification. Skull: Normal. Negative for fracture or focal lesion. Sinuses/Orbits: No  acute finding. Other: Left frontal scalp hematoma. IMPRESSION: Left frontal scalp hematoma. No acute intracranial findings. There is moderate generalized atrophy  and chronic appearing white matter hypodensities which likely represent small vessel ischemic disease. Electronically Signed   By: Andreas Newport M.D.   On: 07/28/2016 00:26   Ct Angio Chest Pe W Or Wo Contrast  Result Date: 07/14/2016 CLINICAL DATA:  Elevated D-dimer, chest pain. EXAM: CT ANGIOGRAPHY CHEST WITH CONTRAST TECHNIQUE: Multidetector CT imaging of the chest was performed using the standard protocol during bolus administration of intravenous contrast. Multiplanar CT image reconstructions and MIPs were obtained to evaluate the vascular anatomy. CONTRAST:  80 cc Isovue COMPARISON:  Chest radiograph 07/14/2016 FINDINGS: Cardiovascular: Exam is somewhat limited by patient body habitus. No filling defects within the proximal pulmonary arteries. The lower lobe pulmonary arteries are not well evaluated. Mediastinum/Nodes: No axillary supraclavicular adenopathy. No mediastinal adenopathy. Esophagus normal. Lungs/Pleura: No pulmonary infarction. No consolidation. Mild interstitial thickening. Minimal atelectasis. No effusion. Upper Abdomen: Limited view of the liver, kidneys, pancreas are unremarkable. Normal adrenal glands. Musculoskeletal: No aggressive osseous lesion. Review of the MIP images confirms the above findings. IMPRESSION: 1. No evidence acute pulmonary embolism in suboptimal exam due to patient body habitus. 2. Mild interstitial edema. 3. No overt pulmonary edema, infarction or pneumonia. Electronically Signed   By: Suzy Bouchard M.D.   On: 07/14/2016 16:38   Dg Chest Port 1 View  Result Date: 07/28/2016 CLINICAL DATA:  Central line placement EXAM: PORTABLE CHEST 1 VIEW COMPARISON:  08/19/2016 FINDINGS: There is a new left subclavian central line extending to the SVC. No pneumothorax. Endotracheal tube tip is 3 cm above the carina. Nasogastric tube extends below the diaphragm and beyond the inferior edge of the image. Airspace opacity persists in the right upper lobe. IMPRESSION: 1.  Support equipment appears satisfactorily positioned. New left subclavian central line. No pneumothorax. 2. Persistent airspace opacity in the right upper lobe. Electronically Signed   By: Andreas Newport M.D.   On: 07/28/2016 02:53   Dg Chest Port 1 View  Result Date: 08/21/2016 CLINICAL DATA:  Emergent intubation EXAM: PORTABLE CHEST 1 VIEW COMPARISON:  07/23/2016 FINDINGS: Endotracheal tube tip is approximately 1.6 cm above the carina. Confluent airspace opacity in the right upper lobe could be infectious. Pulmonary hemorrhage or aspiration are also possibilities. Left lung is grossly clear. No large pneumothorax. IMPRESSION: 1. Satisfactorily positioned ETT. 2. Consolidation in the right upper lobe, possibly pneumonia but aspiration or hemorrhage could also produce this appearance. Electronically Signed   By: Andreas Newport M.D.   On: 08/05/2016 23:34   Dg Chest Portable 1 View  Result Date: 07/23/2016 CLINICAL DATA:  Shortness of breath, tachycardia EXAM: PORTABLE CHEST 1 VIEW COMPARISON:  CT chest 07/14/2014 FINDINGS: There is bilateral mild interstitial thickening. There is no focal parenchymal opacity. There is no pleural effusion or pneumothorax. There is stable cardiomegaly. The osseous structures are unremarkable. IMPRESSION: Cardiomegaly with mild pulmonary vascular congestion. Electronically Signed   By: Kathreen Devoid   On: 07/23/2016 18:45    PHYSICAL EXAM General: Intubated, sedated.  Neck: Thick, JVP difficult, no thyromegaly or thyroid nodule.  Lungs: Dependent crackles. CV: Nondisplaced PMI.  Heart regular S1/S2, no S3/S4, no murmur.  1+ edema to knees bilaterally.  Abdomen: Soft, nontender, no hepatosplenomegaly, no distention.  Neurologic: Sedated.  Extremities: No clubbing or cyanosis.   TELEMETRY: Reviewed telemetry pt in NSR with PVCs  ASSESSMENT AND PLAN: 71 yo with history of paroxysmal atrial fibrillation, mild  cardiomyopathy, COPD on home oxygen, pericardial  effusion with prior pericardiocentesis, and type II diabetes was discharged after Tikosyn load 2/2 and had cardiac arrest/ventricular fibrillation later that day.  Now cooled.  1. Cardiac arrest: Ventricular fibrillation, found down at home by family.  She was defibrillated and has remained in NSR.  She is undergoing hypothermia protocol. I suspect that arrest is most likely Tikosyn-related.  She was just discharged 2/2 after Tikosyn load.  She has CKD stage III at baseline.  QTc was < 500 during her admission for Tikosyn load.  Was > 500 msec on 2/3 but also hypokalemic and being cooled. Troponin was mildly elevated, likely due to arrest/hypotension.  Now on norepinephrine to keep BP up per cooling protocol. - Decreased ventricular ectopy after epinephrine stopped.  Weaning down norepinephrine.   - Pending echo read. 2. Elevated troponin: Mild elevation with minimal trend.  Suspect due to demand ischemia with arrest/hypotension.  No plan for cardiac cath at this point.  3. Atrial fibrillation: Paroxysmal.  Now in NSR.  Will not be Tikosyn candidate in the future.  4. AKI on CKD stage III: Mild elevation in creatinine to 1.7, now down to 1.08.  5. Chronic primarily diastolic CHF: EF 63-87% on last echo with RV moderately dilated/moderately dysfunctional.  Low EF thought to be related to atrial fibrillation. Repeat echo yesterday read pending.  CVP 15 today.  - Will give dose of Lasix 40 mg IV x 1 with potassium.  6. COPD: On home oxygen.  7. ID: RUL opacity, covering with Zosyn for possible aspiration PNA.   Loralie Champagne 07/29/2016 8:44 AM

## 2016-07-30 ENCOUNTER — Encounter (HOSPITAL_COMMUNITY): Payer: Medicare Other | Admitting: Nurse Practitioner

## 2016-07-30 ENCOUNTER — Inpatient Hospital Stay (HOSPITAL_COMMUNITY): Payer: Medicare Other

## 2016-07-30 DIAGNOSIS — G9340 Encephalopathy, unspecified: Secondary | ICD-10-CM

## 2016-07-30 LAB — POCT I-STAT 3, VENOUS BLOOD GAS (G3P V)
ACID-BASE DEFICIT: 1 mmol/L (ref 0.0–2.0)
BICARBONATE: 24.3 mmol/L (ref 20.0–28.0)
O2 SAT: 94 %
PH VEN: 7.348 (ref 7.250–7.430)
TCO2: 26 mmol/L (ref 0–100)
pCO2, Ven: 44 mmHg (ref 44.0–60.0)
pO2, Ven: 73 mmHg — ABNORMAL HIGH (ref 32.0–45.0)

## 2016-07-30 LAB — POCT I-STAT 3, ART BLOOD GAS (G3+)
ACID-BASE DEFICIT: 6 mmol/L — AB (ref 0.0–2.0)
Bicarbonate: 21.4 mmol/L (ref 20.0–28.0)
O2 SAT: 97 %
TCO2: 23 mmol/L (ref 0–100)
pCO2 arterial: 47.1 mmHg (ref 32.0–48.0)
pH, Arterial: 7.265 — ABNORMAL LOW (ref 7.350–7.450)
pO2, Arterial: 106 mmHg (ref 83.0–108.0)

## 2016-07-30 LAB — BASIC METABOLIC PANEL
Anion gap: 11 (ref 5–15)
BUN: 40 mg/dL — AB (ref 6–20)
CHLORIDE: 102 mmol/L (ref 101–111)
CO2: 22 mmol/L (ref 22–32)
CREATININE: 2.55 mg/dL — AB (ref 0.44–1.00)
Calcium: 7.5 mg/dL — ABNORMAL LOW (ref 8.9–10.3)
GFR calc Af Amer: 21 mL/min — ABNORMAL LOW (ref >60)
GFR, EST NON AFRICAN AMERICAN: 18 mL/min — AB (ref >60)
GLUCOSE: 180 mg/dL — AB (ref 65–99)
POTASSIUM: 5.3 mmol/L — AB (ref 3.5–5.1)
SODIUM: 135 mmol/L (ref 135–145)

## 2016-07-30 LAB — CBC
HEMATOCRIT: 29.6 % — AB (ref 36.0–46.0)
Hemoglobin: 9.1 g/dL — ABNORMAL LOW (ref 12.0–15.0)
MCH: 28.2 pg (ref 26.0–34.0)
MCHC: 30.7 g/dL (ref 30.0–36.0)
MCV: 91.6 fL (ref 78.0–100.0)
PLATELETS: 141 10*3/uL — AB (ref 150–400)
RBC: 3.23 MIL/uL — ABNORMAL LOW (ref 3.87–5.11)
RDW: 15.1 % (ref 11.5–15.5)
WBC: 18 10*3/uL — ABNORMAL HIGH (ref 4.0–10.5)

## 2016-07-30 LAB — GLUCOSE, CAPILLARY
GLUCOSE-CAPILLARY: 384 mg/dL — AB (ref 65–99)
GLUCOSE-CAPILLARY: 170 mg/dL — AB (ref 65–99)
Glucose-Capillary: 127 mg/dL — ABNORMAL HIGH (ref 65–99)
Glucose-Capillary: 168 mg/dL — ABNORMAL HIGH (ref 65–99)
Glucose-Capillary: 146 mg/dL — ABNORMAL HIGH (ref 65–99)
Glucose-Capillary: 150 mg/dL — ABNORMAL HIGH (ref 65–99)
Glucose-Capillary: 95 mg/dL (ref 65–99)

## 2016-07-30 LAB — PHOSPHORUS: PHOSPHORUS: 5.7 mg/dL — AB (ref 2.5–4.6)

## 2016-07-30 LAB — MAGNESIUM: MAGNESIUM: 1.7 mg/dL (ref 1.7–2.4)

## 2016-07-30 LAB — APTT: aPTT: 81 seconds — ABNORMAL HIGH (ref 24–36)

## 2016-07-30 LAB — HEPARIN LEVEL (UNFRACTIONATED): Heparin Unfractionated: 0.86 IU/mL — ABNORMAL HIGH (ref 0.30–0.70)

## 2016-07-30 MED ORDER — VITAL HIGH PROTEIN PO LIQD
1000.0000 mL | ORAL | Status: DC
  Administered 2016-07-30: 1000 mL
  Administered 2016-07-31: 10:00:00
  Administered 2016-07-31: 1000 mL

## 2016-07-30 MED ORDER — INSULIN GLARGINE 100 UNIT/ML ~~LOC~~ SOLN
5.0000 [IU] | Freq: Every day | SUBCUTANEOUS | Status: DC
  Administered 2016-07-30 – 2016-07-31 (×2): 5 [IU] via SUBCUTANEOUS
  Filled 2016-07-30 (×2): qty 0.05

## 2016-07-30 MED ORDER — PRO-STAT SUGAR FREE PO LIQD
30.0000 mL | Freq: Two times a day (BID) | ORAL | Status: DC
  Filled 2016-07-30: qty 30

## 2016-07-30 MED ORDER — VITAL HIGH PROTEIN PO LIQD: 1000.0000 mL | ORAL | Status: DC

## 2016-07-30 MED ORDER — INSULIN ASPART 100 UNIT/ML ~~LOC~~ SOLN
0.0000 [IU] | SUBCUTANEOUS | Status: DC
  Administered 2016-07-30: 3 [IU] via SUBCUTANEOUS
  Administered 2016-07-30: 4 [IU] via SUBCUTANEOUS
  Administered 2016-07-30: 3 [IU] via SUBCUTANEOUS
  Administered 2016-07-31: 7 [IU] via SUBCUTANEOUS
  Administered 2016-07-31 – 2016-08-01 (×3): 3 [IU] via SUBCUTANEOUS

## 2016-07-30 MED ORDER — PRO-STAT SUGAR FREE PO LIQD
60.0000 mL | Freq: Every day | ORAL | Status: DC
  Administered 2016-07-30 – 2016-08-01 (×11): 60 mL
  Filled 2016-07-30 (×12): qty 60

## 2016-07-30 NOTE — Progress Notes (Signed)
PULMONARY / CRITICAL CARE MEDICINE   Name: KINZA LUCHS MRN: [id number] DOB: 05-May-1946    ADMISSION DATE:  08/19/2016 CONSULTATION DATE:  08/20/2016  REFERRING MD:  Dr. Abigail Miyamoto  CHIEF COMPLAINT:  PEA arrest  BRIEF SUMMARY:   Ms. Kaelen Govoni is a 71 year old female admitted 2/2 after being found down at home due to cardiac arrest - initial rhythm VF.  Admitted to ICU for hypothermia protocol.   PMH of atrial fibrillation/flutter, hypertension, type II diabetes, mild systolic heart failure, prior pericardial effusion requiring pericardiocentesis, tobacco abuse, COPD with chronic hypoxia requiring supplemental oxygen, obstructive sleep apnea, morbid obesity, and hypothyroidism who was sent in today after being found down at home due to cardiac arrest and underlying rhythm being ventricular fibrillation.     SUBJECTIVE:  RN reports pt re-warmed 2/4 at 1700.  Seizure activity noted with decrease in sedation overnight.  4+ L positive in last 24 hours, 10.3L positive for admit. Episode of desaturations overnight > PEEP 14/100% fio2   VITAL SIGNS: BP (!) 88/64 (BP Location: Left Leg)   Pulse 83   Temp 98.6 F (37 C) (Core (Comment))   Resp (!) 32   Ht 5\' 8"  (1.727 m)   Wt (!) 416 lb 0.1 oz (188.7 kg)   SpO2 98%   BMI 63.25 kg/m   HEMODYNAMICS: CVP:  [14 mmHg-18 mmHg] 14 mmHg  VENTILATOR SETTINGS: Vent Mode: PRVC FiO2 (%):  [50 %-100 %] 100 % Set Rate:  [26 bmp-32 bmp] 32 bmp Vt Set:  [510 mL] 510 mL PEEP:  [12 cmH20-14 cmH20] 14 cmH20 Plateau Pressure:  [26 cmH20-31 cmH20] 31 cmH20  INTAKE / OUTPUT:  Intake/Output Summary (Last 24 hours) at 07/30/16 0836 Last data filed at 07/30/16 0700  Gross per 24 hour  Intake          5106.21 ml  Output              925 ml  Net          4181.21 ml    PHYSICAL EXAMINATION: General: morbidly obese female lying in bed, NAD, appears critically ill HEENT: MM pink/moist, ETT, unable to appreciate JVD due to body habitus  PSY: unable to  assess  Neuro: sedate CV: s1s2 rrr, no m/r/g PULM: even/non-labored, lungs bilaterally clear / vent assisted breaths WO:EHOZ, non-tender, bsx4 active, cooling pads in place Extremities: warm/dry, 2+ generalized edema  Skin: no rashes or lesions   LABS:  BMET  Recent Labs Lab 07/29/16 1830 07/29/16 2222 07/30/16 0447  NA 137 137 135  K 5.6* 5.5* 5.3*  CL 103 102 102  CO2 23 21* 22  BUN 38* 40* 40*  CREATININE 2.26* 2.38* 2.55*  GLUCOSE 141* 179* 180*    Electrolytes  Recent Labs Lab 07/28/2016 2315  07/28/16 0500 07/28/16 2120  07/29/16 1830 07/29/16 2222 07/30/16 0447  CALCIUM 9.9  < > 8.4*  --   < > 7.7* 7.7* 7.5*  MG 2.0  --  1.8 1.4*  --   --   --   --   PHOS 6.9*  --  5.3*  --   --   --   --   --   < > = values in this interval not displayed.  CBC  Recent Labs Lab 07/28/16 0500  07/29/16 0614 07/29/16 1600 07/30/16 0447  WBC 32.7*  --   --  17.6* 18.0*  HGB 10.2*  < > 9.9* 9.8* 9.1*  HCT 34.3*  < >  29.0* 31.4* 29.6*  PLT 233  --   --  130* 141*  < > = values in this interval not displayed.  Coag's  Recent Labs Lab 07/28/16 0056 07/28/16 0836  07/29/16 1600 07/29/16 2200 07/30/16 0447  APTT 37* 36  < > 82* 63* 81*  INR 2.59 2.30  --   --   --   --   < > = values in this interval not displayed.  Sepsis Markers  Recent Labs Lab 08/15/16 2326 07/29/16 1839  LATICACIDVEN 7.87* 1.58    ABG  Recent Labs Lab 07/28/16 0436 07/29/16 2112 07/29/16 2347  PHART 7.280* 7.225* 7.307*  PCO2ART 49.0* 53.8* 47.1  PO2ART 208* 67.0* 94.0    Liver Enzymes  Recent Labs Lab 07/23/16 1745 2016-08-15 2315  AST 51* 45*  ALT 24 27  ALKPHOS 69 80  BILITOT 1.4* 0.7  ALBUMIN 3.8 3.5    Cardiac Enzymes  Recent Labs Lab 07/28/16 0500 07/28/16 1307 07/28/16 1910  TROPONINI 0.64* 0.35* 0.24*    Glucose  Recent Labs Lab 07/29/16 0811 07/29/16 1141 07/29/16 1955 07/29/16 2338 07/30/16 0331 07/30/16 0824  GLUCAP 81 110* 164* 184*  170* 168*    Imaging Dg Chest Port 1 View  Result Date: 07/30/2016 CLINICAL DATA:  Aspiration pneumonia, acute respiratory failure, cardiac arrest. EXAM: PORTABLE CHEST 1 VIEW COMPARISON:  Portable chest x-ray of July 29, 2016 FINDINGS: The lungs are well-expanded. The pulmonary interstitial and alveolar opacities have become more conspicuous especially on the right. The hemidiaphragms are obscured. The cardiac silhouette remains enlarged. The pulmonary vascularity is more engorged. The endotracheal tube tip lies 2.9 cm above the carina. The esophagogastric tube tip projects below the inferior margin of the image. The left subclavian venous catheter tip projects at the junction of the right and left brachiocephalic veins. IMPRESSION: Interval worsening of pulmonary interstitial and alveolar opacities most compatible with CHF. Underlying pneumonia is not excluded. Electronically Signed   By: David  [place] M.D.   On: 07/30/2016 07:10   Dg Chest Port 1 View  Result Date: 07/29/2016 CLINICAL DATA:  Hypoxia. EXAM: PORTABLE CHEST 1 VIEW COMPARISON:  07/29/2016 at 1346 hours FINDINGS: Endotracheal tube with tip measuring 3.9 cm above the carina. Enteric tube tip is off the field of view but below the left hemidiaphragm. A mediastinal drain is in place. Left central venous catheter with tip projected horizontally in the right mediastinum, likely near the junction of the brachiocephalic vein and SVC. Persistent airspace disease demonstrated throughout the right lung suggesting asymmetric edema or pneumonia. Infiltration in the left perihilar region. Probable small bilateral pleural effusions. Cardiac enlargement. No definite pulmonary vascular congestion. Calcification of the aorta. IMPRESSION: Appliances are unchanged in position since prior study. Cardiac enlargement. Bilateral perihilar in diffuse right lung infiltrates could represent edema or pneumonia. Small bilateral pleural effusions. Electronically Signed    By: Burman Nieves M.D.   On: 07/29/2016 22:03   Dg Chest Port 1 View  Result Date: 07/29/2016 CLINICAL DATA:  Pneumonia EXAM: PORTABLE CHEST 1 VIEW COMPARISON:  07/28/2016; 15-Aug-2016; 07/14/2016; chest CT- 07/14/2016 FINDINGS: Grossly unchanged enlarged cardiac silhouette and mediastinal contours. Stable position of support apparatus. No supine evidence of pneumothorax. Minimally improved aeration of lungs with persistent ill-defined nodular airspace opacities about the bilateral hila, right greater than left. No new focal airspace opacities. No evidence of edema. No acute osseus abnormalities. IMPRESSION: 1.  Stable positioning of support apparatus.  No pneumothorax. 2. Minimally improved aeration lungs with persistent ill-defined nodular  airspace opacities about the bilateral hila, right greater than left with differential considerations including (but not limited to) multifocal infection/aspiration and asymmetric pulmonary edema. Continued attention on follow-up is recommended. Electronically Signed   By: Simonne Come M.D.   On: 07/29/2016 14:27    STUDIES:  Head CT 2/2 >> no acute findings ECHO 2/3 >> mild LVH, systolic function mildly reduced, LVEF 45-50%, grade 2 diastolic dysfunction, ventricular septal diastolic/systolic flattening c/w RV volume/pressure overload  CULTURES: Blood 2/2 >>  ANTIBIOTICS: Vanc 2/2 >> Zosyn 2/2 >>  SIGNIFICANT EVENTS: 2/02  Admit after VF arrest, hypothermia protocol  2/04  Re-warmed, normothermia initiated.  Seizure with decreased sedation.   LINES/TUBES: ETT 2/2 >> L [place] TLC 2/2 >>    ASSESSMENT / PLAN: Pulmonary  Acute hypoxic respiratory failure s/p cardiopulmonary arrest  Right > left pulmonary infiltrates ? Aspiration vs edema Respiratory acidosis  Plan PRVC 8 cc/kg  Wean PEEP/FiO2 for sats >92% Continue BD's Hypertonic saline  Follow intermittent CXR / ABG  Cardiology PEA arrest -->Tikosyn related  H/o AF Persistent hypotension  in setting of vasodilation from hypothermia  Mild CM (EF 40-45% prior ECHO) and diastolic dysfxn  Plan Continue levophed / vasopressin for MAP >65 Continue heparin gtt  Cardiology following Hold lasix 2/5 with slight bump in sr cr > consider lasix gtt  ICU monitoring of hemodynamics  No further Tikosyn.   Renal AKI.  Mild Hyperkalemia  Plan Trend BMP / UOP  Replace electrolytes as indicated  Follow I/O's Renally adjust medications KVO IVF's  Infectious disease Aspiration PNA Plan Follow cultures as above  Continue vanco / zosyn Narrow abx as able D4/x abx   Neuro Acute Encephalopathy - likely anoxic.  EEG w/ burst suppression  Seizure vs Myoclonus Plan Continue normothermia protocol  Daily WUA to assess neuro status  Continue keppra Consider repeat EEG Neurology consulted, appreciate input  Morbid Obesity  Plan Begin TF per nutrition   Hyperglycemia  Plan D/C Dextrose IVF Add SSI  Leukocytosis  Anemia  Thrombocytopenia  Plan Monitor CBC  Transfuse per ICU guidelines   Diet: TF DVT:  Heparin gtt  FAMILY  - Updates:  No family at bedside am 2/5.     NP CC Time: 30 minutes.    Canary Brim, NP-C [facility] Pgr: [phone] or if no answer [phone] 07/30/2016, 8:37 AM

## 2016-07-30 NOTE — Progress Notes (Signed)
Nutrition Follow-up  DOCUMENTATION CODES:   Morbid obesity  INTERVENTION:   Vital High Protein @ 5 ml/hr (120 ml/day) 60 ml Prostat five times per day Provides: 1120 kcal, 160 grams protein, and 100 ml H2O. TF regimen and propofol at current rate providing 1899 total kcal/day   NUTRITION DIAGNOSIS:   Inadequate oral intake related to inability to eat as evidenced by NPO status. Ongoing.   GOAL:   Provide needs based on ASPEN/SCCM guidelines Progressing.   MONITOR:   Vent status, Labs, TF tolerance  REASON FOR ASSESSMENT:   Consult Enteral/tube feeding initiation and management  ASSESSMENT:   71 y/o female PMHx Afib/flutter, HTN, DM2, HF, GERD, HLD, tobacco abuse, COPD, OSA, morbid obesity, hypothyroidism. Pt was discharged same day after beginning med for afib. Found unresponsive at home by family.  Spoke with RN. They have not been able to decrease propofol due to movement possibly from seizures. EEG being performed currently.   Patient is currently intubated on ventilator support Propofol: 31.1 ml/hr provides: 821 kcal per day    Diet Order:    NPO  Skin:  Reviewed, no issues  Last BM:  unknown  Height:   Ht Readings from Last 1 Encounters:  07/30/16 5\' 8"  (1.727 m)    Weight:   Wt Readings from Last 1 Encounters:  07/30/16 (!) 416 lb 0.1 oz (188.7 kg)    Ideal Body Weight:  63.64 kg  BMI:  Body mass index is 63.25 kg/m.  Estimated Nutritional Needs:   Kcal:  1400-1600 kcals (22-25 kcal/kg ibw)  Protein:  127-159 g Pro (2-2.5 g/kg bw)  Fluid:  Per MD  EDUCATION NEEDS:   No education needs identified at this time  Kendell Bane RD, LDN, CNSC [phone] Pager [phone] After Hours Pager

## 2016-07-30 NOTE — Progress Notes (Signed)
EP service made aware of events.   Reviewed by/with Dr. Elberta Fortis Agree with discontinuation of Tikosyn.   In SR, infrequent PVC's this morning.   EP service is available if needed, will defer acute care and management to her primary/CCM team.  Francis Dowse, PA-C

## 2016-07-30 NOTE — Consult Note (Signed)
NEURO HOSPITALIST CONSULT NOTE   Requestig physician: Dr. Corrie Dandy   Reason for Consult: prognositication   History obtained from:  chart  HPI:                                                                                                                                          Nancy West is an 71 y.o. female with a significant past medical history of atrial fibrillation/flutter, hypertension, type II diabetes, mild systolic heart failure, prior pericardial requiring pericardiocentesis, tobacco abuse, COPD with chronic hypoxia requiring supplemental oxygen, obstructive sleep apnea, morbid obesity, and hypothyroidism who was sent in today after being found down at home due to cardiac arrest and underlying rhythm being ventricular fibrillation. Per documentation and report, patient was recently discharged home today after starting Tikosyn for atrial fibrillation. It was stated that patient was eventually found down at home unresponsive with a grey appearance by family. Emergency services was called to the home. Per emergency medical services report, patient was noted to be in ventricular fibrillation. She required a total of 3 shocks before she developed a normal spontaneous rhythm. It sounds like patient had a brief episode of atrial fibrillation and then sinus bradycardia for which emergency medical services started transcutaneous pacing. She was intubated prior to coming into the emergency room. She was given IV amiodarone prior to being admitted and was placed on IV epinephrine.   Patient was down for 30 minutes. Currently she has been rewarmed for 15 hours. She is ventilated, On propofol however when propofol was turned off patient shows right arm and right shoulder rhythmic jerking. Neurology was consulted for further evaluation and prognostication of patient. Currently patient is on Keppra 500 mg twice a day however at this time this dose will not be able to be  increased secondary to her renal function declining.  Past Medical History:  Diagnosis Date  . Anemia    Noted 12/2011  . Atrial fib/flutter, transient    In setting of pericardial effusion 12/2011  . Chest pain    a. 12/2011 Neg Lexi MV, EF 56%.    . Diastolic CHF ([place])   . GERD (gastroesophageal reflux disease)   . Hyperlipidemia   . Hypertension   . Hypothyroidism   . Obesity   . OSA (obstructive sleep apnea) 11/05/2013   noncompliant with CPAP  . Pericardial effusion    a. Large, s/p pericardiocentesis 01/02/12 yielding 67m fluid (neg malignant cells, only inflammatory cells)  . Pneumonia 09/01/2013   "for the first time" (09/01/2013)  . Tobacco dependence    a. 46 pack yr hx.  . Type 2 diabetes, diet controlled [place]   . Venous stasis of lower extremity    a. chronic LEE    Past Surgical History:  Procedure Laterality Date  . APPENDECTOMY  ~ 2008  . BREAST BIOPSY Right 1980's   a. Benign   . CARDIOVERSION N/A 07/16/2016   Procedure: CARDIOVERSION;  Surgeon: Sanda Klein, MD;  Location: [facility] ENDOSCOPY;  Service: Cardiovascular;  Laterality: N/A;  . CHOLECYSTECTOMY  1980's  . PERICARDIAL TAP N/A 01/02/2012   Procedure: PERICARDIAL TAP;  Surgeon: Burnell Blanks, MD;  [facility]: [facility] CATH LAB;  Service: Cardiovascular;  Laterality: N/A;  . PERICARDIOCENTESIS  12/2011   yielding 654m fluid (neg malignant cells, only inflammatory cells)/notes 12/30/2011  . TEE WITHOUT CARDIOVERSION N/A 07/16/2016   Procedure: TRANSESOPHAGEAL ECHOCARDIOGRAM (TEE);  Surgeon: MSanda Klein MD;  [facility];  Service: Cardiovascular;  Laterality: N/A;    Family History  Problem Relation Age of Onset  . Breast cancer Mother 767 . Hypertension Mother   . Arthritis Mother   . Anxiety disorder Mother   . Colon cancer Mother   . Diabetes [place] Healthy Sister      Social History:  reports that she quit smoking about 2 years ago. Her smoking use included  Cigarettes. She quit after 50.00 years of use. She has never used smokeless tobacco. She reports that she does not drink alcohol or use drugs.  Allergies  Allergen Reactions  . Augmentin [Amoxicillin-Pot Clavulanate] Other (See Comments)    Abdominal pain    MEDICATIONS:                                                                                                                     Scheduled: . albuterol  2.5 mg Nebulization Q6H  . artificial tears  1 application Both Eyes QU2P . chlorhexidine gluconate (MEDLINE KIT)  15 mL Mouth Rinse BID  . Chlorhexidine Gluconate Cloth  6 each Topical Daily  . feeding supplement (PRO-STAT SUGAR FREE 64)  30 mL Per Tube BID  . feeding supplement (VITAL HIGH PROTEIN)  1,000 mL Per Tube Q24H  . fentaNYL (SUBLIMAZE) injection  50 mcg Intravenous Once  . insulin aspart  0-20 Units Subcutaneous Q4H  . insulin glargine  5 Units Subcutaneous QHS  . levETIRAcetam  500 mg Intravenous Q12H  . mouth rinse  15 mL Mouth Rinse 10 times per day  . pantoprazole (PROTONIX) IV  40 mg Intravenous QHS  . piperacillin-tazobactam (ZOSYN)  IV  3.375 g Intravenous Q8H  . sodium chloride flush  10-40 mL Intracatheter Q12H  . sodium chloride HYPERTONIC  4 mL Nebulization Daily  . vancomycin  1,750 mg Intravenous Q24H     ROS:  History obtained from unobtainable from patient due to mental status    Blood pressure (!) 88/64, pulse 83, temperature 98.8 F (37.1 C), resp. rate (!) 32, height 5' 8"  (1.727 m), weight (!) 188.7 kg (416 lb 0.1 oz), SpO2 91 %.   Neurologic Examination:                                                                                                      HEENT-  Normocephalic, no lesions, without obvious abnormality.  Normal external eye and conjunctiva.  Normal TM's bilaterally.  Normal auditory canals and  external ears. Normal external nose, mucus membranes and septum.  Normal pharynx. Cardiovascular- S1, S2 normal, pulses palpable throughout   Lungs- chest clear, no wheezing, rales, normal symmetric air entry Abdomen- soft, non-tender; bowel sounds normal; no masses,  no organomegaly Extremities- no edema Lymph-no adenopathy palpable Musculoskeletal-no joint tenderness, deformity or swelling Skin-warm and dry, no hyperpigmentation, vitiligo, or suspicious lesions  Neurological Examination Mental Status: Patient does not respond to verbal stimuli.  Does not respond to deep sternal rub.  Does not follow commands.  No verbalizations noted.  Cranial Nerves: II: patient does not respond confrontation bilaterally, pupils right 2 mm, left 2 mm,and nonreactive bilaterally III,IV,VI: doll's response absent bilaterally.  V,VII: corneal reflex absent bilaterally  VIII: patient does not respond to verbal stimuli IX,X: gag reflex absent, XI: trapezius strength unable to test bilaterally XII: tongue strength unable to test Motor: Extremities flaccid throughout.  No spontaneous movement noted.  No purposeful movements noted. Sensory: Does not respond to noxious stimuli in any extremity. Deep Tendon Reflexes:  Absent throughout. Plantars: downgoing bilaterally Cerebellar: Unable to perform      Lab Results: Basic Metabolic Panel:  Recent Labs Lab 07/26/16 0246 08/16/2016 0430 08/05/2016 2315  07/28/16 0500  07/28/16 2120  07/29/16 1014 07/29/16 1415 07/29/16 1830 07/29/16 2222 07/30/16 0447  NA 143 144 142  < > 142  < >  --   < > 140 139 137 137 135  K 4.4 4.2 4.4  < > 3.2*  < >  --   < > 3.9 5.1 5.6* 5.5* 5.3*  CL 98* 96* 97*  < > 102  < >  --   < > 104 105 103 102 102  CO2 37* 35* 24  < > 24  --   --   --  24 25 23  21* 22  GLUCOSE 117* 102* 314*  < > 380*  < >  --   < > 93 130* 141* 179* 180*  BUN 28* 29* 33*  < > 34*  < >  --   < > 37* 37* 38* 40* 40*  CREATININE 1.38* 1.25*  1.84*  < > 1.78*  < >  --   < > 1.89* 1.99* 2.26* 2.38* 2.55*  CALCIUM 9.3 9.2 9.9  < > 8.4*  --   --   --  7.7* 7.6* 7.7* 7.7* 7.5*  MG 1.9 2.0 2.0  --  1.8  --  1.4*  --   --   --   --   --   --  PHOS  --   --  6.9*  --  5.3*  --   --   --   --   --   --   --   --   < > = values in this interval not displayed.  Liver Function Tests:  Recent Labs Lab 07/23/16 1745 08/03/2016 2315  AST 51* 45*  ALT 24 27  ALKPHOS 69 80  BILITOT 1.4* 0.7  PROT 6.9 6.8  ALBUMIN 3.8 3.5   No results for input(s): LIPASE, AMYLASE in the last 168 hours. No results for input(s): AMMONIA in the last 168 hours.  CBC:  Recent Labs Lab 07/23/16 1745 08/20/2016 2315  07/28/16 0500  07/28/16 2233 07/29/16 0216 07/29/16 0614 07/29/16 1600 07/30/16 0447  WBC 12.2* 27.4*  --  32.7*  --   --   --   --  17.6* 18.0*  NEUTROABS 9.2* 22.8*  --   --   --   --   --   --   --   --   HGB 11.3* 11.0*  < > 10.2*  < > 10.2* 9.2* 9.9* 9.8* 9.1*  HCT 37.7 36.8  < > 34.3*  < > 30.0* 27.0* 29.0* 31.4* 29.6*  MCV 93.3 95.8  --  94.8  --   --   --   --  90.5 91.6  PLT 248 207  --  233  --   --   --   --  130* 141*  < > = values in this interval not displayed.  Cardiac Enzymes:  Recent Labs Lab 07/28/16 0056 07/28/16 0500 07/28/16 1307 07/28/16 1910  TROPONINI 0.34* 0.64* 0.35* 0.24*    Lipid Panel: No results for input(s): CHOL, TRIG, HDL, CHOLHDL, VLDL, LDLCALC in the last 168 hours.  CBG:  Recent Labs Lab 07/29/16 1605 07/29/16 1955 07/29/16 2338 07/30/16 0331 07/30/16 0824  GLUCAP 127* 164* 184* 170* 168*    Microbiology: Results for orders placed or performed during the hospital encounter of 08/20/2016  MRSA PCR Screening     Status: None   Collection Time: 07/28/16  2:30 AM  Result Value Ref Range Status   MRSA by PCR NEGATIVE NEGATIVE Final    Comment:        The GeneXpert MRSA Assay (FDA approved for NASAL specimens only), is one component of a comprehensive MRSA  colonization surveillance program. It is not intended to diagnose MRSA infection nor to guide or monitor treatment for MRSA infections.     Coagulation Studies:  Recent Labs  07/28/16 0056 07/28/16 0836  LABPROT 28.3* 25.7*  INR 2.59 2.30    Imaging: Dg Chest Port 1 View  Result Date: 07/30/2016 CLINICAL DATA:  Aspiration pneumonia, acute respiratory failure, cardiac arrest. EXAM: PORTABLE CHEST 1 VIEW COMPARISON:  Portable chest x-ray of July 29, 2016 FINDINGS: The lungs are well-expanded. The pulmonary interstitial and alveolar opacities have become more conspicuous especially on the right. The hemidiaphragms are obscured. The cardiac silhouette remains enlarged. The pulmonary vascularity is more engorged. The endotracheal tube tip lies 2.9 cm above the carina. The esophagogastric tube tip projects below the inferior margin of the image. The left subclavian venous catheter tip projects at the junction of the right and left brachiocephalic veins. IMPRESSION: Interval worsening of pulmonary interstitial and alveolar opacities most compatible with CHF. Underlying pneumonia is not excluded. Electronically Signed   By: Kalea Perine  [place] M.D.   On: 07/30/2016 07:10   Dg Chest Port 1 View  Result Date:  07/29/2016 CLINICAL DATA:  Hypoxia. EXAM: PORTABLE CHEST 1 VIEW COMPARISON:  07/29/2016 at 1346 hours FINDINGS: Endotracheal tube with tip measuring 3.9 cm above the carina. Enteric tube tip is off the field of view but below the left hemidiaphragm. A mediastinal drain is in place. Left central venous catheter with tip projected horizontally in the right mediastinum, likely near the junction of the brachiocephalic vein and SVC. Persistent airspace disease demonstrated throughout the right lung suggesting asymmetric edema or pneumonia. Infiltration in the left perihilar region. Probable small bilateral pleural effusions. Cardiac enlargement. No definite pulmonary vascular congestion. Calcification of  the aorta. IMPRESSION: Appliances are unchanged in position since prior study. Cardiac enlargement. Bilateral perihilar in diffuse right lung infiltrates could represent edema or pneumonia. Small bilateral pleural effusions. Electronically Signed   By: Lucienne Capers M.D.   On: 07/29/2016 22:03   Dg Chest Port 1 View  Result Date: 07/29/2016 CLINICAL DATA:  Pneumonia EXAM: PORTABLE CHEST 1 VIEW COMPARISON:  07/28/2016; 07/26/2016; 07/14/2016; chest CT- 07/14/2016 FINDINGS: Grossly unchanged enlarged cardiac silhouette and mediastinal contours. Stable position of support apparatus. No supine evidence of pneumothorax. Minimally improved aeration of lungs with persistent ill-defined nodular airspace opacities about the bilateral hila, right greater than left. No new focal airspace opacities. No evidence of edema. No acute osseus abnormalities. IMPRESSION: 1.  Stable positioning of support apparatus.  No pneumothorax. 2. Minimally improved aeration lungs with persistent ill-defined nodular airspace opacities about the bilateral hila, right greater than left with differential considerations including (but not limited to) multifocal infection/aspiration and asymmetric pulmonary edema. Continued attention on follow-up is recommended. Electronically Signed   By: Sandi Mariscal M.D.   On: 07/29/2016 14:27    EEG: Sedation: Propofol 24mg/kg/min  Technique: This is a 21 channel routine scalp EEG performed at the bedside with bipolar and monopolar montages arranged in accordance to the international 10/20 system of electrode placement. One channel was dedicated to EKG recording.    Background: The background consists of a burst suppression pattern with fairly long interburst interval of 1 - minutes followed by 3-4 bursts lasting about a second with 2-3 second interburst interval. The bursts consist of intermixed alpha and theta activity with some sharply contoured frontal component.     Photic stimulation:  Physiologic driving is not performed  EEG Abnormalities: 1) Burst suppression  Clinical Interpretation: This EEG is consistent with a profound cerebral dysfunction as can be seen in medication induced coma as well as severe hypoxic brain injury.   There was no seizure recorded on this study.    Assessment and plan per attending neurologist  DEtta QuillPA-C [facility] [phone] 07/30/2016, 11:39 AM   Assessment/Plan: This is an unfortunate 71year old female status post cardiac arrest with underlying rhythm of ventricular fibrillation. Downtime was approximately 30 minutes. Patient currently has been rewarmed for approximately 15 hours. Her propofol patient shows no abnormal rhythmic activity however when turned off patient does show rhythmic right arm rhythmic activity which could be mild clonus versus simple complex seizure. Exam shows greater findings of negative doll's, negative pupillary, negative corneals, negative gag and no response to pain.  At present time we'll turn propofol off reorder EEG to confirm if this is mild clonus from hypoxia or actual seizure activity.  Would recommend MRI of brain to further evaluate hypoxic injury.

## 2016-07-30 NOTE — Progress Notes (Signed)
EEG completed, results pending. 

## 2016-07-30 NOTE — Plan of Care (Signed)
  Interdisciplinary Goals of Care Family Meeting   Date carried out:: 07/30/2016  Location of the meeting: Bedside  Member's involved: Nurse Practitioner, Bedside Registered Nurse and Family Member or next of kin  Durable Power of Attorney or acting medical decision maker: Husband     Discussion: We discussed goals of care for [name] .  Patients husband reports she has a living will. She would want to receive aggressive support if chance for meaningful recovery.  However, if she is deemed to need long term support without chance of recovery, she would favor withdrawal of care.   Code status: Full Code  Disposition: Continue current acute care  Time spent for the meeting: 20 minutes    Nancy Brim, NP-C [facility] Pgr: [phone] or if no answer [phone] 07/30/2016, 2:31 PM

## 2016-07-30 NOTE — Progress Notes (Signed)
ANTICOAGULATION CONSULT NOTE - Follow Up Consult  Pharmacy Consult for Heparin (hold Xarelto) Indication: chest pain/ACS, s/p cardiac arrest with ROSC  Allergies  Allergen Reactions  . Augmentin [Amoxicillin-Pot Clavulanate] Other (See Comments)    Abdominal pain   Vital Signs: Temp: 98.8 F (37.1 C) (02/05 0800) Temp Source: Core (Comment) (02/05 0400) BP: 88/64 (02/05 0600) Pulse Rate: 83 (02/05 0600)  Labs:  Recent Labs  07/28/16 0056  07/28/16 0500  07/28/16 0836  07/28/16 1307  07/28/16 1910  07/29/16 0614  07/29/16 1600 07/29/16 1830 07/29/16 2200 07/29/16 2222 07/30/16 0447  HGB  --   < > 10.2*  < >  --   < >  --   < >  --   < > 9.9*  --  9.8*  --   --   --  9.1*  HCT  --   < > 34.3*  < >  --   < >  --   < >  --   < > 29.0*  --  31.4*  --   --   --  29.6*  PLT  --   --  233  --   --   --   --   --   --   --   --   --  130*  --   --   --  141*  APTT 37*  --   --   --  36  --   --   --   --   < >  --   < > 82*  --  63*  --  81*  LABPROT 28.3*  --   --   --  25.7*  --   --   --   --   --   --   --   --   --   --   --   --   INR 2.59  --   --   --  2.30  --   --   --   --   --   --   --   --   --   --   --   --   HEPARINUNFRC  --   --  >2.20*  --   --   --   --   --   --   --   --   --  1.32*  --   --   --  0.86*  CREATININE 1.35*  < > 1.78*  < >  --   < >  --   < >  --   < > 1.80*  < >  --  2.26*  --  2.38* 2.55*  TROPONINI 0.34*  --  0.64*  --   --   --  0.35*  --  0.24*  --   --   --   --   --   --   --   --   < > = values in this interval not displayed.  Estimated Creatinine Clearance: 37.9 mL/min (by C-G formula based on SCr of 2.55 mg/dL (H)).  Assessment: 71 y/o F w/ hx afib s/p cardiac arrest with ROSC on heparin while Xarelto on hold. S/p therapeutic hypothermia and now rewarmed. -aPTT= 81 and at goal; heparin level remains slightly (falsely) elevated -CBC stable  Goal of Therapy:  Heparin level 0.3-0.7 units/ml aPTT 66-102 seconds Monitor platelets by  anticoagulation protocol: Yes   Plan:  -No heparin  changes needed -Daily heparin level, aPTT and CBC  Tad Moore, BCPS  Clinical Pharmacist Pager [phone]  07/30/2016 11:33 AM

## 2016-07-30 NOTE — Procedures (Signed)
ELECTROENCEPHALOGRAM REPORT  Date of Study: 07/30/2016  Patient's Name: Nancy West MRN: [id number] Date of Birth: [birth year]  Referring Provider: Felicie Morn, PA-C  Clinical History: This is a 71 year old woman s/p cardiac arrest. She starts having jerky movements when weaned off sedation.  Medications: levETIRAcetam (KEPPRA) 500 mg in sodium chloride 0.9 % 100 mL IVPB  albuterol (PROVENTIL) (2.5 MG/3ML) 0.083% nebulizer solution 2.5 mg  norepinephrine (LEVOPHED) 16 mg in dextrose 5 % 250 mL (0.064 mg/mL) infusion  pantoprazole (PROTONIX) injection 40 mg  piperacillin-tazobactam (ZOSYN) IVPB 3.375 g  vancomycin (VANCOCIN) 1,750 mg in sodium chloride 0.9 % 500 mL IVPB  vasopressin (PITRESSIN) 40 Units in sodium chloride 0.9 % 250 mL (0.16 Units/mL) infusion   Technical Summary: A multichannel digital EEG recording measured by the international 10-20 system with electrodes applied with paste and impedances below 5000 ohms performed as portable with EKG monitoring in an intubated and unresponsive patient.  Hyperventilation and photic stimulation were not performed.  The digital EEG was referentially recorded, reformatted, and digitally filtered in a variety of bipolar and referential montages for optimal display.   Description: The patient is intubated and unresponsive during the recording. At the beginning of the recording, the background consists of a mixture of low voltage alpha and theta activity, maximal over the frontal leads. There are generalized discharges with triphasic appearance, maximal over the frontal regions, occurring every 1-2 seconds. At 02:30 minutes into the study, triphasic discharges significantly reduce in frequency, occurring every 2-40 seconds, then disappear after 04:00 minutes until the end of the study. The patient is noted to start having right-sided twitching of the arm and lips at 03:28 minutes with movement/muscle artifact seen over the right side, no  epileptiform correlate seen. Right arm EKG artifact seen at a regular frequency of 2-3 Hz with no evolution in frequency or amplitude until the end of the study. Normal sleep architecture is not seen. Hyperventilation and photic stimulation did not elicit any abnormalities.  There were no electrographic seizures seen.    EKG lead was unremarkable.  Impression: This EEG is abnormal due to the presence of: 1. Low voltage diffuse slowing of the background 2. Generalized discharges with triphasic appearance at the beginning of the study, that disappear 4 minutes into the recording  Clinical Correlation of the above findings indicates diffuse cerebral dysfunction that is non-specific in etiology and can be seen with hypoxic/ischemic injury, toxic/metabolic encephalopathies, or medication effect. Generalized discharges seen at the beginning of the study that resolve as noted above could be seen with post-ictal change. Right-sided myoclonic activity did not show electrographic correlate. There were no electrographic seizures in this study. Clinical correlation is advised.   Patrcia Dolly, M.D.

## 2016-07-31 ENCOUNTER — Inpatient Hospital Stay (HOSPITAL_COMMUNITY): Payer: Medicare Other

## 2016-07-31 ENCOUNTER — Ambulatory Visit: Payer: Medicare Other | Admitting: Pulmonary Disease

## 2016-07-31 LAB — COMPREHENSIVE METABOLIC PANEL
ALK PHOS: 54 U/L (ref 38–126)
ALT: 24 U/L (ref 14–54)
ANION GAP: 15 (ref 5–15)
AST: 56 U/L — ABNORMAL HIGH (ref 15–41)
Albumin: 2.7 g/dL — ABNORMAL LOW (ref 3.5–5.0)
BILIRUBIN TOTAL: 0.8 mg/dL (ref 0.3–1.2)
BUN: 53 mg/dL — ABNORMAL HIGH (ref 6–20)
CALCIUM: 7.9 mg/dL — AB (ref 8.9–10.3)
CO2: 21 mmol/L — ABNORMAL LOW (ref 22–32)
Chloride: 100 mmol/L — ABNORMAL LOW (ref 101–111)
Creatinine, Ser: 3.04 mg/dL — ABNORMAL HIGH (ref 0.44–1.00)
GFR calc non Af Amer: 15 mL/min — ABNORMAL LOW (ref >60)
GFR, EST AFRICAN AMERICAN: 17 mL/min — AB (ref >60)
Glucose, Bld: 139 mg/dL — ABNORMAL HIGH (ref 65–99)
Potassium: 4.8 mmol/L (ref 3.5–5.1)
SODIUM: 136 mmol/L (ref 135–145)
Total Protein: 5.8 g/dL — ABNORMAL LOW (ref 6.5–8.1)

## 2016-07-31 LAB — CBC
HCT: 29 % — ABNORMAL LOW (ref 36.0–46.0)
Hemoglobin: 8.7 g/dL — ABNORMAL LOW (ref 12.0–15.0)
MCH: 27.8 pg (ref 26.0–34.0)
MCHC: 30 g/dL (ref 30.0–36.0)
MCV: 92.7 fL (ref 78.0–100.0)
PLATELETS: 124 10*3/uL — AB (ref 150–400)
RBC: 3.13 MIL/uL — AB (ref 3.87–5.11)
RDW: 15.5 % (ref 11.5–15.5)
WBC: 14.1 10*3/uL — ABNORMAL HIGH (ref 4.0–10.5)

## 2016-07-31 LAB — HEPARIN LEVEL (UNFRACTIONATED)
HEPARIN UNFRACTIONATED: 0.38 [IU]/mL (ref 0.30–0.70)
Heparin Unfractionated: 0.23 IU/mL — ABNORMAL LOW (ref 0.30–0.70)

## 2016-07-31 LAB — GLUCOSE, CAPILLARY
GLUCOSE-CAPILLARY: 128 mg/dL — AB (ref 65–99)
Glucose-Capillary: 105 mg/dL — ABNORMAL HIGH (ref 65–99)
Glucose-Capillary: 108 mg/dL — ABNORMAL HIGH (ref 65–99)
Glucose-Capillary: 112 mg/dL — ABNORMAL HIGH (ref 65–99)
Glucose-Capillary: 117 mg/dL — ABNORMAL HIGH (ref 65–99)
Glucose-Capillary: 130 mg/dL — ABNORMAL HIGH (ref 65–99)
Glucose-Capillary: 132 mg/dL — ABNORMAL HIGH (ref 65–99)

## 2016-07-31 LAB — PHOSPHORUS
PHOSPHORUS: 6.6 mg/dL — AB (ref 2.5–4.6)
Phosphorus: 6.5 mg/dL — ABNORMAL HIGH (ref 2.5–4.6)

## 2016-07-31 LAB — MAGNESIUM
MAGNESIUM: 2 mg/dL (ref 1.7–2.4)
Magnesium: 1.6 mg/dL — ABNORMAL LOW (ref 1.7–2.4)

## 2016-07-31 LAB — APTT
aPTT: 58 seconds — ABNORMAL HIGH (ref 24–36)
aPTT: 49 seconds — ABNORMAL HIGH (ref 24–36)

## 2016-07-31 MED ORDER — MAGNESIUM SULFATE 2 GM/50ML IV SOLN
2.0000 g | Freq: Once | INTRAVENOUS | Status: AC
  Administered 2016-07-31: 2 g via INTRAVENOUS
  Filled 2016-07-31: qty 50

## 2016-07-31 MED ORDER — SODIUM CHLORIDE 0.9 % IV SOLN
1000.0000 mg | Freq: Two times a day (BID) | INTRAVENOUS | Status: DC
  Administered 2016-07-31 – 2016-08-01 (×2): 1000 mg via INTRAVENOUS
  Filled 2016-07-31 (×3): qty 10

## 2016-07-31 MED ORDER — VALPROATE SODIUM 500 MG/5ML IV SOLN
500.0000 mg | Freq: Two times a day (BID) | INTRAVENOUS | Status: DC
  Administered 2016-07-31 – 2016-08-01 (×3): 500 mg via INTRAVENOUS
  Filled 2016-07-31 (×4): qty 5

## 2016-07-31 MED ORDER — HEPARIN (PORCINE) IN NACL 100-0.45 UNIT/ML-% IJ SOLN
1550.0000 [IU]/h | INTRAMUSCULAR | Status: DC
  Administered 2016-07-31 (×2): 1400 [IU]/h via INTRAVENOUS
  Administered 2016-08-01: 1550 [IU]/h via INTRAVENOUS
  Filled 2016-07-31 (×2): qty 250

## 2016-07-31 NOTE — Progress Notes (Signed)
Subjective: Interval History:  No acute events overnight. Pt starts to have right sided myoclonus again when propofol is held.  Objective: Vital signs in last 24 hours: Temp:  [97.2 F (36.2 C)-99.7 F (37.6 C)] 99 F (37.2 C) (02/06 1000) Pulse Rate:  [82-188] 87 (02/06 0900) Resp:  [0-33] 21 (02/06 0900) BP: (79-150)/(33-75) 103/69 (02/06 0900) SpO2:  [79 %-100 %] 100 % (02/06 0900) Arterial Line BP: (106-171)/(43-66) 137/43 (02/06 0900) FiO2 (%):  [90 %-100 %] 90 % (02/06 0845) Weight:  [200.5 kg (442 lb)] 200.5 kg (442 lb) (02/06 0500)  Intake/Output from previous day: 02/05 0701 - 02/06 0700 In: 2921.2 [I.V.:1735.9; NG/GT:275.3; IV Piggyback:910] Out: 285 [Urine:285] Intake/Output this shift: Total I/O In: 270.6 [I.V.:85.6; NG/GT:185] Out: 420 [Urine:420] Nutritional status:    Physical Exam Intubated and off sedation Pupils approximately 3 mm and reactive to light. Corneals absent bilaterally. Gag absent. She is breathing over the ventilator. occulocephalic absent. No withdrawal to noxious stimulation was noted. She does have myoclonic jerks of right upper extremity and the face when propofol was held  Lab Results:  Recent Labs  07/30/16 0447 07/31/16 0435  WBC 18.0* 14.1*  HGB 9.1* 8.7*  HCT 29.6* 29.0*  PLT 141* 124*  NA 135 136  K 5.3* 4.8  CL 102 100*  CO2 22 21*  GLUCOSE 180* 139*  BUN 40* 53*  CREATININE 2.55* 3.04*  CALCIUM 7.5* 7.9*   Lipid Panel No results for input(s): CHOL, TRIG, HDL, CHOLHDL, VLDL, LDLCALC in the last 72 hours.  Studies/Results: Dg Chest Port 1 View  Result Date: 07/31/2016 CLINICAL DATA:  70 year old female with respiratory failure. Subsequent encounter. EXAM: PORTABLE CHEST 1 VIEW COMPARISON:  07/30/2016. FINDINGS: Endotracheal tube tip 3.1 cm above the carina. Gastric tube seen to level of distal esophagus and not further distally secondary to technique. Multiple overlying lines. Cardiomegaly. Diffuse asymmetric airspace  disease greater on the right suggestive of pulmonary edema minimally changed since prior exam. Infectious infiltrate not excluded in proper clinical setting. Lucency right perihilar region may represent overlapping structures without other findings of pneumothorax. Attention to this on follow-up. Calcified tortuous aorta. IMPRESSION: Cardiomegaly. Diffuse asymmetric airspace disease greater on the right suggestive of pulmonary edema minimally changed since prior exam. Lucency right perihilar region may represent overlapping structures without other findings of pneumothorax. Attention to this on follow-up. Electronically Signed   By: Genia Del M.D.   On: 07/31/2016 07:22   Dg Chest Port 1 View  Result Date: 07/30/2016 CLINICAL DATA:  Aspiration pneumonia, acute respiratory failure, cardiac arrest. EXAM: PORTABLE CHEST 1 VIEW COMPARISON:  Portable chest x-ray of July 29, 2016 FINDINGS: The lungs are well-expanded. The pulmonary interstitial and alveolar opacities have become more conspicuous especially on the right. The hemidiaphragms are obscured. The cardiac silhouette remains enlarged. The pulmonary vascularity is more engorged. The endotracheal tube tip lies 2.9 cm above the carina. The esophagogastric tube tip projects below the inferior margin of the image. The left subclavian venous catheter tip projects at the junction of the right and left brachiocephalic veins. IMPRESSION: Interval worsening of pulmonary interstitial and alveolar opacities most compatible with CHF. Underlying pneumonia is not excluded. Electronically Signed   By: David  Martinique M.D.   On: 07/30/2016 07:10   Dg Chest Port 1 View  Result Date: 07/29/2016 CLINICAL DATA:  Hypoxia. EXAM: PORTABLE CHEST 1 VIEW COMPARISON:  07/29/2016 at 1346 hours FINDINGS: Endotracheal tube with tip measuring 3.9 cm above the carina. Enteric tube tip is off  the field of view but below the left hemidiaphragm. A mediastinal drain is in place. Left  central venous catheter with tip projected horizontally in the right mediastinum, likely near the junction of the brachiocephalic vein and SVC. Persistent airspace disease demonstrated throughout the right lung suggesting asymmetric edema or pneumonia. Infiltration in the left perihilar region. Probable small bilateral pleural effusions. Cardiac enlargement. No definite pulmonary vascular congestion. Calcification of the aorta. IMPRESSION: Appliances are unchanged in position since prior study. Cardiac enlargement. Bilateral perihilar in diffuse right lung infiltrates could represent edema or pneumonia. Small bilateral pleural effusions. Electronically Signed   By: Lucienne Capers M.D.   On: 07/29/2016 22:03   Dg Chest Port 1 View  Result Date: 07/29/2016 CLINICAL DATA:  Pneumonia EXAM: PORTABLE CHEST 1 VIEW COMPARISON:  07/28/2016; 08/16/2016; 07/14/2016; chest CT- 07/14/2016 FINDINGS: Grossly unchanged enlarged cardiac silhouette and mediastinal contours. Stable position of support apparatus. No supine evidence of pneumothorax. Minimally improved aeration of lungs with persistent ill-defined nodular airspace opacities about the bilateral hila, right greater than left. No new focal airspace opacities. No evidence of edema. No acute osseus abnormalities. IMPRESSION: 1.  Stable positioning of support apparatus.  No pneumothorax. 2. Minimally improved aeration lungs with persistent ill-defined nodular airspace opacities about the bilateral hila, right greater than left with differential considerations including (but not limited to) multifocal infection/aspiration and asymmetric pulmonary edema. Continued attention on follow-up is recommended. Electronically Signed   By: Sandi Mariscal M.D.   On: 07/29/2016 14:27    Medications:  Scheduled: . albuterol  2.5 mg Nebulization Q6H  . chlorhexidine gluconate (MEDLINE KIT)  15 mL Mouth Rinse BID  . Chlorhexidine Gluconate Cloth  6 each Topical Daily  . feeding  supplement (PRO-STAT SUGAR FREE 64)  60 mL Per Tube 5 X Daily  . feeding supplement (VITAL HIGH PROTEIN)  1,000 mL Per Tube Q24H  . fentaNYL (SUBLIMAZE) injection  50 mcg Intravenous Once  . insulin aspart  0-20 Units Subcutaneous Q4H  . insulin glargine  5 Units Subcutaneous QHS  . levETIRAcetam  500 mg Intravenous Q12H  . mouth rinse  15 mL Mouth Rinse 10 times per day  . pantoprazole (PROTONIX) IV  40 mg Intravenous QHS  . piperacillin-tazobactam (ZOSYN)  IV  3.375 g Intravenous Q8H  . sodium chloride flush  10-40 mL Intracatheter Q12H  . vancomycin  1,750 mg Intravenous Q24H   Continuous: . sodium chloride 10 mL/hr at 07/30/16 1900  . fentaNYL infusion INTRAVENOUS Stopped (07/31/16 0800)  . heparin 1,400 Units/hr (07/31/16 0759)  . norepinephrine (LEVOPHED) Adult infusion 8 mcg/min (07/31/16 0800)  . propofol (DIPRIVAN) infusion 30 mcg/kg/min (07/31/16 0845)  . vasopressin (PITRESSIN) infusion - *FOR SHOCK* Stopped (07/30/16 1640)   AXK:PVVZSMOL, fentaNYL (SUBLIMAZE) injection, midazolam, sodium chloride flush  Assessment/Plan: 71 years old female with multiple medical problems and cardiac arrest day 2 after rewarmed. There has been no change in her clinical examination. Unfortunately we are having extremely difficulty keeping sedation off due to myoclonic jerks on right side. Unclear etiology of unilateral myoclonic jerks and EEG did not show any correlate. This is most likely related to anoxic injury.   -Awaiting MRI Brain and if unable to obtain due to weight limitation then may get a repeat CT head -Increasing Keppra to 1010m BID and starting depacon 5057mBID with the hopes of getting her off of sedation.  Neurological examination off sedation is not very reassuring. Pt with post hypothermia protocol typically have sedating medications in system for  longer than usual therefore will need to get her off propofol. Will wait till CT head is done prior to stating a definitive  prognosis. No family members at bedside.   LOS: 3 days   Qaiser Toqeer   

## 2016-07-31 NOTE — Progress Notes (Signed)
PULMONARY / CRITICAL CARE MEDICINE   Name: Nancy West MRN: [id number] DOB: 1946-06-05    ADMISSION DATE:  07/31/2016 CONSULTATION DATE:  08/03/2016  REFERRING MD:  Dr. Abigail Miyamoto  CHIEF COMPLAINT:  PEA arrest  BRIEF SUMMARY:   Nancy West is a 71 year old female admitted 2/2 after being found down at home due to cardiac arrest - initial rhythm VF.  Admitted to ICU for hypothermia protocol.   PMH of atrial fibrillation/flutter, hypertension, type II diabetes, mild systolic heart failure, prior pericardial effusion requiring pericardiocentesis, tobacco abuse, COPD with chronic hypoxia requiring supplemental oxygen, obstructive sleep apnea, morbid obesity, and hypothyroidism who was sent in today after being found down at home due to cardiac arrest and underlying rhythm being ventricular fibrillation.    SUBJECTIVE:  Remains sedated due to myoclonus. Neuro working up.  VITAL SIGNS: BP (!) 105/44   Pulse 91   Temp 99 F (37.2 C)   Resp (!) 32   Ht 5\' 8"  (1.727 m)   Wt (!) 442 lb (200.5 kg)   SpO2 100%   BMI 67.21 kg/m   HEMODYNAMICS: CVP:  [14 mmHg-20 mmHg] 14 mmHg  VENTILATOR SETTINGS: Vent Mode: PRVC FiO2 (%):  [90 %-100 %] 90 % Set Rate:  [32 bmp] 32 bmp Vt Set:  [510 mL] 510 mL PEEP:  [14 cmH20] 14 cmH20 Plateau Pressure:  [24 cmH20-35 cmH20] 29 cmH20  INTAKE / OUTPUT:  Intake/Output Summary (Last 24 hours) at 07/31/16 1113 Last data filed at 07/31/16 1000  Gross per 24 hour  Intake          3050.75 ml  Output              625 ml  Net          2425.75 ml    PHYSICAL EXAMINATION: General: morbidly obese female lying in bed, NAD, appears critically ill, heavily sedated HEENT: MM pink/moist, ETT, unable to appreciate JVD due to body habitus  PSY: unable to assess  Neuro: sedate CV: s1s2 rrr, no m/r/g PULM: even/non-labored, lungs bilaterally rhonchi WU:JWJX, non-tender, bsx4 active, cooling pads in place Extremities: warm/dry, 2+ generalized edema  Skin:  no rashes or lesions   LABS:  BMET  Recent Labs Lab 07/29/16 2222 07/30/16 0447 07/31/16 0435  NA 137 135 136  K 5.5* 5.3* 4.8  CL 102 102 100*  CO2 21* 22 21*  BUN 40* 40* 53*  CREATININE 2.38* 2.55* 3.04*  GLUCOSE 179* 180* 139*    Electrolytes  Recent Labs Lab 07/28/16 0500 07/28/16 2120  07/29/16 2222 07/30/16 0447 07/30/16 1900 07/31/16 0435  CALCIUM 8.4*  --   < > 7.7* 7.5*  --  7.9*  MG 1.8 1.4*  --   --   --  1.7 1.6*  PHOS 5.3*  --   --   --   --  5.7* 6.5*  < > = values in this interval not displayed.  CBC  Recent Labs Lab 07/29/16 1600 07/30/16 0447 07/31/16 0435  WBC 17.6* 18.0* 14.1*  HGB 9.8* 9.1* 8.7*  HCT 31.4* 29.6* 29.0*  PLT 130* 141* 124*    Coag's  Recent Labs Lab 07/28/16 0056 07/28/16 0836  07/29/16 2200 07/30/16 0447 07/31/16 0435  APTT 37* 36  < > 63* 81* 58*  INR 2.59 2.30  --   --   --   --   < > = values in this interval not displayed.  Sepsis Markers  Recent Labs Lab 08/08/2016  2326 07/29/16 1839  LATICACIDVEN 7.87* 1.58    ABG  Recent Labs Lab 07/29/16 2112 07/29/16 2347 07/30/16 1229  PHART 7.225* 7.307* 7.265*  PCO2ART 53.8* 47.1 47.1  PO2ART 67.0* 94.0 106.0    Liver Enzymes  Recent Labs Lab 08/09/2016 2315 07/31/16 0435  AST 45* 56*  ALT 27 24  ALKPHOS 80 54  BILITOT 0.7 0.8  ALBUMIN 3.5 2.7*    Cardiac Enzymes  Recent Labs Lab 07/28/16 0500 07/28/16 1307 07/28/16 1910  TROPONINI 0.64* 0.35* 0.24*    Glucose  Recent Labs Lab 07/30/16 1241 07/30/16 1601 07/30/16 2058 07/31/16 0038 07/31/16 0435 07/31/16 0745  GLUCAP 146* 150* 95 108* 132* 117*    Imaging Dg Chest Port 1 View  Result Date: 07/31/2016 CLINICAL DATA:  71 year old female with respiratory failure. Subsequent encounter. EXAM: PORTABLE CHEST 1 VIEW COMPARISON:  07/30/2016. FINDINGS: Endotracheal tube tip 3.1 cm above the carina. Gastric tube seen to level of distal esophagus and not further distally secondary  to technique. Multiple overlying lines. Cardiomegaly. Diffuse asymmetric airspace disease greater on the right suggestive of pulmonary edema minimally changed since prior exam. Infectious infiltrate not excluded in proper clinical setting. Lucency right perihilar region may represent overlapping structures without other findings of pneumothorax. Attention to this on follow-up. Calcified tortuous aorta. IMPRESSION: Cardiomegaly. Diffuse asymmetric airspace disease greater on the right suggestive of pulmonary edema minimally changed since prior exam. Lucency right perihilar region may represent overlapping structures without other findings of pneumothorax. Attention to this on follow-up. Electronically Signed   By: Lacy Duverney M.D.   On: 07/31/2016 07:22    STUDIES:  Head CT 2/2 >> no acute findings ECHO 2/3 >> mild LVH, systolic function mildly reduced, LVEF 45-50%, grade 2 diastolic dysfunction, ventricular septal diastolic/systolic flattening c/w RV volume/pressure overload  CULTURES: Blood 2/2 >>  ANTIBIOTICS: Vanc 2/2 >> Zosyn 2/2 >>  SIGNIFICANT EVENTS: 2/02  Admit after VF arrest, hypothermia protocol  2/04  Re-warmed, normothermia initiated.  Seizure with decreased sedation.  2/6 on 90% fio2 and 14 peep  LINES/TUBES: ETT 2/2 >> L [place] TLC 2/2 >>    ASSESSMENT / PLAN: Pulmonary  Acute hypoxic respiratory failure s/p cardiopulmonary arrest  Right > left pulmonary infiltrates ? Aspiration vs edema Respiratory acidosis  Plan PRVC 8 cc/kg  Wean PEEP/FiO2 for sats >92%, 2/6 decrease peep and check abg Continue BD's Follow intermittent CXR / ABG  Cardiology PEA arrest -->Tikosyn related  H/o AF Persistent hypotension in setting of vasodilation from hypothermia  Mild CM (EF 40-45% prior ECHO) and diastolic dysfxn  Plan Continue levophed / vasopressin for MAP >65 Continue heparin gtt  Cardiology following Hold lasix 2/6 with slight bump in sr cr  ICU monitoring of  hemodynamics  No further Tikosyn.   Renal AKI. Worse 2/6 Mild Hyperkalemia  Lab Results  Component Value Date   CREATININE 3.04 (H) 07/31/2016   CREATININE 2.55 (H) 07/30/2016   CREATININE 2.38 (H) 07/29/2016   CREATININE 1.32 (H) 01/03/2016   CREATININE 1.27 (H) 06/29/2015   CREATININE 1.16 (H) 03/18/2014    Recent Labs Lab 07/29/16 2222 07/30/16 0447 07/31/16 0435  K 5.5* 5.3* 4.8      Plan Trend BMP / UOP  Replace electrolytes as indicated  Follow I/O's Renally adjust medications KVO IVF's  Infectious disease Aspiration PNA Plan Follow cultures as above  Continue vanco / zosyn Narrow abx as able D5/x abx   Neuro Acute Encephalopathy - likely anoxic.  EEG w/ burst suppression  Seizure vs Myoclonus Plan Continue sedation for myoclonus. Further workup per neuro 2/6  Continue keppra Neurology consulted, appreciate input  Morbid Obesity  CBG (last 3)   Recent Labs  07/31/16 0038 07/31/16 0435 07/31/16 0745  GLUCAP 108* 132* 117*     Plan Begin TF per nutrition   Hyperglycemia  Plan D/C Dextrose IVF Add SSI  Leukocytosis  Anemia  Thrombocytopenia   Recent Labs  07/30/16 0447 07/31/16 0435  HGB 9.1* 8.7*    Plan Monitor CBC  Transfuse per ICU guidelines   Diet: TF DVT:  Heparin gtt  FAMILY  - Updates:  No family at bedside am 2/6.     NP CC Time: 30 minutes.    Brett Canales Minor ACNP [facility] PCCM Pager [phone] till 3 pm If no answer page [phone] 07/31/2016, 11:14 AM  Attending Note:  71 year old morbidly obese female s/p PEA cardiac arrest, patient has significant myoclonus.  Husband was updated yesterday by Merry Proud and they appear to very reasonable.  They are only asking for prognostication, they would not want the patient trach/peg/SNF kind of existence.  On exam, patient remains completely unresponsive with myoclonus if off propofol.  I reviewed CXR myself, ETT ok.  Will order MRI today and await neurology  prognostication.  Will arrange for a family meeting once MRI is resulted and neurology is ready for prognostication.  The patient is critically ill with multiple organ systems failure and requires high complexity decision making for assessment and support, frequent evaluation and titration of therapies, application of advanced monitoring technologies and extensive interpretation of multiple databases.   Critical Care Time devoted to patient care services described in this note is  35  Minutes. This time reflects time of care of this signee Dr Koren Bound. This critical care time does not reflect procedure time, or teaching time or supervisory time of PA/NP/Med student/Med Resident etc but could involve care discussion time.  Alyson Reedy, M.D. [facility] Pulmonary/Critical Care Medicine. Pager: [phone]. After hours pager: [phone].

## 2016-07-31 NOTE — Progress Notes (Signed)
ANTICOAGULATION CONSULT NOTE - Follow Up Consult  Pharmacy Consult for Heparin (hold Xarelto) Indication: chest pain/ACS, s/p cardiac arrest with ROSC  Allergies  Allergen Reactions  . Augmentin [Amoxicillin-Pot Clavulanate] Other (See Comments)    Abdominal pain   Vital Signs: Temp: 98.8 F (37.1 C) (02/06 1600) Temp Source: Core (Comment) (02/06 1600) BP: 95/52 (02/06 1600) Pulse Rate: 110 (02/06 1600)  Labs:  Recent Labs  07/28/16 1910  07/29/16 1600  07/29/16 2222 07/30/16 0447 07/31/16 0435 07/31/16 1400  HGB  --   < > 9.8*  --   --  9.1* 8.7*  --   HCT  --   < > 31.4*  --   --  29.6* 29.0*  --   PLT  --   --  130*  --   --  141* 124*  --   APTT  --   < > 82*  < >  --  81* 58* 49*  HEPARINUNFRC  --   < > 1.32*  --   --  0.86* 0.38 0.23*  CREATININE  --   < >  --   < > 2.38* 2.55* 3.04*  --   TROPONINI 0.24*  --   --   --   --   --   --   --   < > = values in this interval not displayed.  Estimated Creatinine Clearance: 33.1 mL/min (by C-G formula based on SCr of 3.04 mg/dL (H)).   . sodium chloride 10 mL/hr at 07/30/16 1900  . fentaNYL infusion INTRAVENOUS Stopped (07/31/16 0800)  . heparin 1,400 Units/hr (07/31/16 1614)  . norepinephrine (LEVOPHED) Adult infusion Stopped (07/31/16 1600)  . propofol (DIPRIVAN) infusion 30 mcg/kg/min (07/31/16 1614)  . vasopressin (PITRESSIN) infusion - *FOR SHOCK* Stopped (07/30/16 1640)    Assessment: 71 y/o F w/ hx afib s/p cardiac arrest with ROSC on heparin while Xarelto on hold. S/p therapeutic hypothermia and now rewarmed. -aPTT=49 below goal; heparin level now below goal at 0.23 -Hgb drifiting down slightly. Pltc stable. -No issues with heparin infusion per RN.  Goal of Therapy:  Heparin level 0.3-0.7 units/ml aPTT 66-102 seconds Monitor platelets by anticoagulation protocol: Yes   Plan:  -Increase IV heparin to 1550 units/hr. -Recheck heparin level in am, do not feel necessary to check any more aptt's at this  time  Sheppard Coil PharmD., BCPS Clinical Pharmacist Pager [phone] 07/31/2016 4:56 PM

## 2016-07-31 NOTE — Progress Notes (Signed)
Pharmacy Antibiotic Note  Nancy West is a 71 y.o. female admitted on 08-05-16 with cardiac arrest.  Pharmacy has been consulted for Vancomycin/Zosyn dosing for HCAP vs Aspiration PNA. Scr still rising a bit.  Est CrCl ~ 33 ml/min.  WBC trending down.   Plan: -Continue vancomycin 1750 mg IV q24h -Continue Zosyn 3.375G IV q8h infused over 4 hours -Will f/u for LOT. -Vancomycin trough level tomorrow AM.  Temp (24hrs), Avg:98.7 F (37.1 C), Min:97.2 F (36.2 C), Max:99.7 F (37.6 C)   Recent Labs Lab 2016/08/05 2315  08-05-16 2326  07/28/16 0500  07/29/16 1415 07/29/16 1600 07/29/16 1830 07/29/16 1839 07/29/16 2222 07/30/16 0447 07/31/16 0435  WBC 27.4*  --   --   --  32.7*  --   --  17.6*  --   --   --  18.0* 14.1*  CREATININE 1.84*  < >  --   < > 1.78*  < > 1.99*  --  2.26*  --  2.38* 2.55* 3.04*  LATICACIDVEN  --   --  7.87*  --   --   --   --   --   --  1.58  --   --   --   < > = values in this interval not displayed.  Estimated Creatinine Clearance: 33.1 mL/min (by C-G formula based on SCr of 3.04 mg/dL (H)).    Allergies  Allergen Reactions  . Augmentin [Amoxicillin-Pot Clavulanate] Other (See Comments)    Abdominal pain   Antimicrobials this admission:  Vanc 2/3 >> * Zosyn 2/3>> *  Dose adjustments this admission:  n/a  Microbiology results:   BCx: *  UCx: *   Sputum: * 2/3 MRSA PCR: neg  Tad Moore, BCPS  Clinical Pharmacist Pager [phone]  07/31/2016 7:57 AM

## 2016-07-31 NOTE — Care Management Note (Signed)
Case Management Note  Patient Details  Name: Nancy West MRN: [id number] Date of Birth: 1946-02-22  Subjective/Objective:        Adm w cardiac arrest, vent            Action/Plan: lives w husband  Expected Discharge Date:                  Expected Discharge Plan:     In-House Referral:     Discharge planning Services     Post Acute Care Choice:    Choice offered to:     DME Arranged:    DME Agency:     HH Arranged:    HH Agency:     Status of Service:  In process, will continue to follow  If discussed at Long Length of Stay Meetings, dates discussed:    Additional Comments: will cont to moniter as pt progresses.  Hanley Hays, RN 07/31/2016, 10:53 AM

## 2016-07-31 NOTE — Progress Notes (Signed)
ANTICOAGULATION CONSULT NOTE - Follow Up Consult  Pharmacy Consult for Heparin (hold Xarelto) Indication: chest pain/ACS, s/p cardiac arrest with ROSC  Allergies  Allergen Reactions  . Augmentin [Amoxicillin-Pot Clavulanate] Other (See Comments)    Abdominal pain   Vital Signs: Temp: 98.6 F (37 C) (02/06 0500) Temp Source: Core (Comment) (02/06 0500) BP: 99/54 (02/06 0700) Pulse Rate: 89 (02/06 0700)  Labs:  Recent Labs  07/28/16 0836  07/28/16 1307  07/28/16 1910  07/29/16 1600  07/29/16 2200 07/29/16 2222 07/30/16 0447 07/31/16 0435  HGB  --   < >  --   < >  --   < > 9.8*  --   --   --  9.1* 8.7*  HCT  --   < >  --   < >  --   < > 31.4*  --   --   --  29.6* 29.0*  PLT  --   --   --   --   --   --  130*  --   --   --  141* 124*  APTT 36  --   --   --   --   < > 82*  --  63*  --  81* 58*  LABPROT 25.7*  --   --   --   --   --   --   --   --   --   --   --   INR 2.30  --   --   --   --   --   --   --   --   --   --   --   HEPARINUNFRC  --   --   --   --   --   --  1.32*  --   --   --  0.86* 0.38  CREATININE  --   < >  --   < >  --   < >  --   < >  --  2.38* 2.55* 3.04*  TROPONINI  --   --  0.35*  --  0.24*  --   --   --   --   --   --   --   < > = values in this interval not displayed.  Estimated Creatinine Clearance: 33.1 mL/min (by C-G formula based on SCr of 3.04 mg/dL (H)).   . sodium chloride 10 mL/hr at 07/30/16 1900  . fentaNYL infusion INTRAVENOUS 50 mcg/hr (07/30/16 1700)  . heparin    . norepinephrine (LEVOPHED) Adult infusion 7 mcg/min (07/31/16 0538)  . propofol (DIPRIVAN) infusion 30 mcg/kg/min (07/31/16 0357)  . vasopressin (PITRESSIN) infusion - *FOR SHOCK* Stopped (07/30/16 1640)    Assessment: 71 y/o F w/ hx afib s/p cardiac arrest with ROSC on heparin while Xarelto on hold. S/p therapeutic hypothermia and now rewarmed. -aPTT=58 and below goal; heparin level now within range, but not clear if still a little falsely elevated from recent  Xarelto -Hgb drifiting down a bit.  Pltc stable. -No issues with heparin infusion per RN.  Goal of Therapy:  Heparin level 0.3-0.7 units/ml aPTT 66-102 seconds Monitor platelets by anticoagulation protocol: Yes   Plan:  -Increase IV heparin to 1400 units/hr. -Recheck heparin level and PTT at 1400, hopefully can switch to monitoring only heparin levels after this time. -Daily heparin level and CBC   Tad Moore, BCPS  Clinical Pharmacist Pager [phone]  07/31/2016 7:52 AM

## 2016-07-31 NOTE — Progress Notes (Signed)
Seizure-like activity noted.  Elink notified.  Versed 1 mg given.  RN will continue to monitor.

## 2016-08-01 ENCOUNTER — Inpatient Hospital Stay (HOSPITAL_COMMUNITY): Payer: Medicare Other

## 2016-08-01 ENCOUNTER — Ambulatory Visit: Payer: Medicare Other | Admitting: Family Medicine

## 2016-08-01 LAB — BLOOD GAS, ARTERIAL
ACID-BASE DEFICIT: 6.3 mmol/L — AB (ref 0.0–2.0)
Acid-base deficit: 10.8 mmol/L — ABNORMAL HIGH (ref 0.0–2.0)
Acid-base deficit: 11.3 mmol/L — ABNORMAL HIGH (ref 0.0–2.0)
BICARBONATE: 20 mmol/L (ref 20.0–28.0)
BICARBONATE: 17.4 mmol/L — AB (ref 20.0–28.0)
Bicarbonate: 16.7 mmol/L — ABNORMAL LOW (ref 20.0–28.0)
DRAWN BY: 31101
DRAWN BY: 41977
Drawn by: 441261
FIO2: 80
FIO2: 70
FIO2: 70
LHR: 32 {breaths}/min
MECHVT: 510 mL
MECHVT: 510 mL
O2 SAT: 90.1 %
O2 Saturation: 98.9 %
O2 Saturation: 92.7 %
PATIENT TEMPERATURE: 98.6
PATIENT TEMPERATURE: 98.6
PATIENT TEMPERATURE: 98.6
PCO2 ART: 50.2 mmHg — AB (ref 32.0–48.0)
PCO2 ART: 59.8 mmHg — AB (ref 32.0–48.0)
PCO2 ART: 55.8 mmHg — AB (ref 32.0–48.0)
PEEP/CPAP: 14 cmH2O
PEEP: 14 cmH2O
PEEP: 14 cmH2O
PH ART: 7.225 — AB (ref 7.350–7.450)
PO2 ART: 84.3 mmHg (ref 83.0–108.0)
PO2 ART: 75.5 mmHg — AB (ref 83.0–108.0)
RATE: 32 resp/min
RATE: 36 resp/min
VT: 510 mL
pH, Arterial: 7.092 — CL (ref 7.350–7.450)
pH, Arterial: 7.103 — CL (ref 7.350–7.450)
pO2, Arterial: 159 mmHg — ABNORMAL HIGH (ref 83.0–108.0)

## 2016-08-01 LAB — CBC
HEMATOCRIT: 34.1 % — AB (ref 36.0–46.0)
Hemoglobin: 10 g/dL — ABNORMAL LOW (ref 12.0–15.0)
MCH: 28.1 pg (ref 26.0–34.0)
MCHC: 29.3 g/dL — ABNORMAL LOW (ref 30.0–36.0)
MCV: 95.8 fL (ref 78.0–100.0)
Platelets: 155 10*3/uL (ref 150–400)
RBC: 3.56 MIL/uL — ABNORMAL LOW (ref 3.87–5.11)
RDW: 15.9 % — AB (ref 11.5–15.5)
WBC: 22.3 10*3/uL — AB (ref 4.0–10.5)

## 2016-08-01 LAB — HEPARIN LEVEL (UNFRACTIONATED)
Heparin Unfractionated: 0.35 IU/mL (ref 0.30–0.70)
Heparin Unfractionated: 0.31 IU/mL (ref 0.30–0.70)

## 2016-08-01 LAB — GLUCOSE, CAPILLARY
GLUCOSE-CAPILLARY: 125 mg/dL — AB (ref 65–99)
GLUCOSE-CAPILLARY: 114 mg/dL — AB (ref 65–99)
GLUCOSE-CAPILLARY: 106 mg/dL — AB (ref 65–99)

## 2016-08-01 LAB — BASIC METABOLIC PANEL
Anion gap: 20 — ABNORMAL HIGH (ref 5–15)
BUN: 70 mg/dL — ABNORMAL HIGH (ref 6–20)
CHLORIDE: 98 mmol/L — AB (ref 101–111)
CO2: 17 mmol/L — AB (ref 22–32)
CREATININE: 4.02 mg/dL — AB (ref 0.44–1.00)
Calcium: 8 mg/dL — ABNORMAL LOW (ref 8.9–10.3)
GFR calc non Af Amer: 11 mL/min — ABNORMAL LOW (ref >60)
GFR, EST AFRICAN AMERICAN: 12 mL/min — AB (ref >60)
GLUCOSE: 110 mg/dL — AB (ref 65–99)
Potassium: 5.7 mmol/L — ABNORMAL HIGH (ref 3.5–5.1)
Sodium: 135 mmol/L (ref 135–145)

## 2016-08-01 LAB — VANCOMYCIN, TROUGH: Vancomycin Tr: 44 ug/mL (ref 15–20)

## 2016-08-01 LAB — MAGNESIUM: Magnesium: 2.2 mg/dL (ref 1.7–2.4)

## 2016-08-01 LAB — LACTIC ACID, PLASMA: LACTIC ACID, VENOUS: 3.7 mmol/L — AB (ref 0.5–1.9)

## 2016-08-01 MED ORDER — MORPHINE BOLUS VIA INFUSION
5.0000 mg | INTRAVENOUS | Status: DC | PRN
  Filled 2016-08-01: qty 20

## 2016-08-01 MED ORDER — SODIUM CHLORIDE 0.9 % IV SOLN
10.0000 mg/h | INTRAVENOUS | Status: DC
Start: 2016-08-01 — End: 2016-08-01
  Administered 2016-08-01: 1 mg/h via INTRAVENOUS
  Filled 2016-08-01: qty 10

## 2016-08-01 MED ORDER — STERILE WATER FOR INJECTION IV SOLN
INTRAVENOUS | Status: DC
  Administered 2016-08-01: 09:00:00 via INTRAVENOUS
  Filled 2016-08-01 (×2): qty 850

## 2016-08-02 ENCOUNTER — Telehealth: Payer: Self-pay | Admitting: Family Medicine

## 2016-08-02 ENCOUNTER — Telehealth: Payer: Self-pay

## 2016-08-02 NOTE — Telephone Encounter (Signed)
Sympathy card sent 

## 2016-08-02 NOTE — Telephone Encounter (Signed)
On 08/02/16 I received a death certificate from [place] ([place] The death certificate is for cremation. The patient is a patient of Doctor Molli Knock. The death certificate will be taken to [facility] [place]) this pm for signature.  On 2016-08-30 I received the death certificate back from Doctor Molli Knock. I got the death certificate ready and called the funeral home to let them know the death certificate is ready for pickup. I also faxed a copy to the funeral home per the funeral home request.

## 2016-08-03 ENCOUNTER — Ambulatory Visit (HOSPITAL_COMMUNITY): Payer: Medicare Other | Admitting: Nurse Practitioner

## 2016-08-22 ENCOUNTER — Ambulatory Visit: Payer: Medicare Other | Admitting: Internal Medicine

## 2016-08-23 NOTE — Progress Notes (Signed)
PULMONARY / CRITICAL CARE MEDICINE   Name: Nancy West MRN: [id number] DOB: 06/16/1946    ADMISSION DATE:  08-07-16 CONSULTATION DATE:  07-Aug-2016  REFERRING MD:  Dr. Abigail Miyamoto  CHIEF COMPLAINT:  PEA arrest  BRIEF SUMMARY:   Nancy West is a 71 year old female admitted 2/2 after being found down at home due to cardiac arrest - initial rhythm VF.  Admitted to ICU for hypothermia protocol.   PMH of atrial fibrillation/flutter, hypertension, type II diabetes, mild systolic heart failure, prior pericardial effusion requiring pericardiocentesis, tobacco abuse, COPD with chronic hypoxia requiring supplemental oxygen, obstructive sleep apnea, morbid obesity, and hypothyroidism who was sent in today after being found down at home due to cardiac arrest and underlying rhythm being ventricular fibrillation.    SUBJECTIVE: Multi organ dysfunction with poor neuro prognosis, therefore we have reached futile care  VITAL SIGNS: BP (!) 89/56   Pulse 91   Temp 98.8 F (37.1 C) (Oral)   Resp (!) 36   Ht 5\' 8"  (1.727 m)   Wt (!) 494 lb (224.1 kg)   SpO2 94%   BMI 75.11 kg/m   HEMODYNAMICS: CVP:  [12 mmHg-20 mmHg] 18 mmHg  VENTILATOR SETTINGS: Vent Mode: PRVC FiO2 (%):  [70 %-80 %] 70 % Set Rate:  [32 bmp-36 bmp] 36 bmp Vt Set:  [510 mL] 510 mL PEEP:  [14 cmH20] 14 cmH20 Plateau Pressure:  [27 cmH20-33 cmH20] 33 cmH20  INTAKE / OUTPUT:  Intake/Output Summary (Last 24 hours) at 08/12/2016 0957 Last data filed at 07/26/2016 7829  Gross per 24 hour  Intake          2975.16 ml  Output              860 ml  Net          2115.16 ml    PHYSICAL EXAMINATION: General: morbidly obese female lying in bed, NAD, appears critically ill, heavily sedated. When sedation decreased she has szs HEENT: MM pink/moist, ETT, unable to appreciate JVD due to body habitus  PSY: unable to assess  Neuro: sedate, or having tonic clonic movement CV: s1s2 rrr, no m/r/g PULM: even/non-labored, lungs bilaterally  rhonchi FA:OZHY, non-tender, bsx4 active, cooling pads in place Extremities: warm/dry, 2+ generalized edema  Skin: no rashes or lesions   LABS:  BMET  Recent Labs Lab 07/30/16 0447 07/31/16 0435 08/19/2016 0430  NA 135 136 135  K 5.3* 4.8 5.7*  CL 102 100* 98*  CO2 22 21* 17*  BUN 40* 53* 70*  CREATININE 2.55* 3.04* 4.02*  GLUCOSE 180* 139* 110*    Electrolytes  Recent Labs Lab 07/30/16 0447 07/30/16 1900 07/31/16 0435 07/31/16 1600 08/08/2016 0430  CALCIUM 7.5*  --  7.9*  --  8.0*  MG  --  1.7 1.6* 2.0 2.2  PHOS  --  5.7* 6.5* 6.6*  --     CBC  Recent Labs Lab 07/30/16 0447 07/31/16 0435 08/15/2016 0430  WBC 18.0* 14.1* 22.3*  HGB 9.1* 8.7* 10.0*  HCT 29.6* 29.0* 34.1*  PLT 141* 124* 155    Coag's  Recent Labs Lab 07/28/16 0056 07/28/16 0836  07/30/16 0447 07/31/16 0435 07/31/16 1400  APTT 37* 36  < > 81* 58* 49*  INR 2.59 2.30  --   --   --   --   < > = values in this interval not displayed.  Sepsis Markers  Recent Labs Lab 2016-08-07 2326 07/29/16 1839 08/09/2016 0432  LATICACIDVEN 7.87* 1.58 3.7*  ABG  Recent Labs Lab 07/31/16 1514 Aug 17, 2016 0254 17-Aug-2016 0500  PHART 7.225* 7.092* 7.103*  PCO2ART 50.2* 59.8* 55.8*  PO2ART 159* 84.3 75.5*    Liver Enzymes  Recent Labs Lab 08/07/2016 2315 07/31/16 0435  AST 45* 56*  ALT 27 24  ALKPHOS 80 54  BILITOT 0.7 0.8  ALBUMIN 3.5 2.7*    Cardiac Enzymes  Recent Labs Lab 07/28/16 0500 07/28/16 1307 07/28/16 1910  TROPONINI 0.64* 0.35* 0.24*    Glucose  Recent Labs Lab 07/31/16 1157 07/31/16 1630 07/31/16 2024 07/31/16 2350 08-17-2016 0430 2016-08-17 0817  GLUCAP 128* 105* 112* 130* 125* 114*    Imaging Dg Chest Port 1 View  Result Date: 08/17/16 CLINICAL DATA:  Respiratory failure. EXAM: PORTABLE CHEST 1 VIEW COMPARISON:  07/31/2016 FINDINGS: Endotracheal tube and NG tube remain in good position. Left central venous catheter tip is in the superior vena cava. Hazy  bilateral pulmonary infiltrates have improved bilaterally. IMPRESSION: Improved bilateral pulmonary infiltrates. Electronically Signed   By: Francene Boyers M.D.   On: 08/17/2016 07:15    STUDIES:  Head CT 2/2 >> no acute findings ECHO 2/3 >> mild LVH, systolic function mildly reduced, LVEF 45-50%, grade 2 diastolic dysfunction, ventricular septal diastolic/systolic flattening c/w RV volume/pressure overload  CULTURES: Blood 2/2 >>not done  ANTIBIOTICS: Vanc 2/2 >>2/5 Zosyn 2/2 >>  SIGNIFICANT EVENTS: 2/02  Admit after VF arrest, hypothermia protocol  2/04  Re-warmed, normothermia initiated.  Seizure with decreased sedation.  2/6 on 90% fio2 and 14 peep  LINES/TUBES: ETT 2/2 >> L [place] TLC 2/2 >>    ASSESSMENT / PLAN: Pulmonary  Acute hypoxic respiratory failure s/p cardiopulmonary arrest  Right > left pulmonary infiltrates ? Aspiration vs edema Respiratory acidosis  Plan PRVC 8 cc/kg  Unable to wean Continue BD's Follow intermittent CXR / ABG  Cardiology PEA arrest -->Tikosyn related  H/o AF Persistent hypotension in setting of vasodilation from hypothermia  Mild CM (EF 40-45% prior ECHO) and diastolic dysfxn  Plan Continue levophed / vasopressin for MAP >65 Continue heparin gtt  Cardiology following Hold lasix 2/6 with slight bump in sr cr  ICU monitoring of hemodynamics  No further Tikosyn.   Renal AKI. Worse 2/7 and coupled with metabolic acidosis Mild Hyperkalemia  Lab Results  Component Value Date   CREATININE 4.02 (H) 08/17/2016   CREATININE 3.04 (H) 07/31/2016   CREATININE 2.55 (H) 07/30/2016   CREATININE 1.32 (H) 01/03/2016   CREATININE 1.27 (H) 06/29/2015   CREATININE 1.16 (H) 03/18/2014    Recent Labs Lab 07/30/16 0447 07/31/16 0435 2016/08/17 0430  K 5.3* 4.8 5.7*      Plan Trend BMP / UOP  Replace electrolytes as indicated  Follow I/O's Renally adjust medications KVO IVF's  Infectious disease Aspiration PNA Plan Follow cultures  as above  Continue zosyn Narrow abx as able D6/x abx   Neuro Acute Encephalopathy - likely anoxic.  EEG w/ burst suppression  Seizure vs Myoclonus 2/7 still with tonic clonic activity. For MRI 2/7 but suspect it wont change anything Plan Continue sedation for myoclonus. Further workup per neuro 2/6  Continue keppra Neurology consulted, appreciate input 2/7 for MRI  Morbid Obesity  CBG (last 3)   Recent Labs  07/31/16 2350 2016-08-17 0430 08-17-16 0817  GLUCAP 130* 125* 114*     Plan Begin TF per nutrition   Hyperglycemia  CBG (last 3)   Recent Labs  07/31/16 2350 08/17/16 0430 2016/08/17 0817  GLUCAP 130* 125* 114*  Plan D/C Dextrose IVF Add SSI  Leukocytosis  Anemia  Thrombocytopenia   Recent Labs  07/31/16 0435 08-07-16 0430  HGB 8.7* 10.0*    Plan Monitor CBC  Transfuse per ICU guidelines   Diet: TF DVT:  Heparin gtt  FAMILY  - Updates:  No family at bedside am 2/7   2/7 worsening renal failure, metabolic acidosis coupled with poor neuro signs equals futile care at this point.  NP CC Time: 30 minutes.    Brett Canales Minor ACNP [facility] PCCM Pager [phone] till 3 pm If no answer page [phone] 08-07-2016, 9:57 AM  Attending Note:  71 year old morbidly obese female s/p PEA cardiac arrest, Nancy West has significant myoclonus.  Husband and sister are bedside.  On exam, Nancy West remains completely unresponsive with myoclonus if off propofol and off propofol is completely unresponsive other than myoclonus.  I reviewed CXR myself, ETT ok.  Unable to obtain MRI due to size.  Also of note, levophed demand has been steadily increasing, up to 50 mcg.  Cr, potassium and acidosis are worsening despite hydration and bicarb drip.  Spoke with husband and sister.  After discussion, the Nancy West has a living will that says to extraordinary measures.  They would still like to speak with neurology about prognosis which I have discussed with him and indicated that it  is poor.  They are sure she would not want trach/peg/SNF placement.  Therefore, post discussion, will make Nancy West a full DNR with no further escalation of care and once they speak with neurology will discuss extubation terminally with comfort measures.  But if in the meantime, BP crashes and Nancy West expires then will just notify Nancy West's family.  The Nancy West is critically ill with multiple organ systems failure and requires high complexity decision making for assessment and support, frequent evaluation and titration of therapies, application of advanced monitoring technologies and extensive interpretation of multiple databases.   Critical Care Time devoted to Nancy West care services described in this note is  35  Minutes. This time reflects time of care of this signee Dr Koren Bound. This critical care time does not reflect procedure time, or teaching time or supervisory time of PA/NP/Med student/Med Resident etc but could involve care discussion time.  Alyson Reedy, M.D. [facility] Pulmonary/Critical Care Medicine. Pager: [phone]. After hours pager: [phone].

## 2016-08-23 NOTE — Progress Notes (Signed)
Pt unable to fit into our MRI scanner because of size.  Nurses aware.

## 2016-08-23 NOTE — Progress Notes (Signed)
Patient's husband expressed wishes to make patient full comfort care and withdraw support.  Dr. Molli Knock notified & priest paged for last rites. [place], Mitzi Hansen

## 2016-08-23 NOTE — Progress Notes (Signed)
Pharmacy Antibiotic Note  Nancy West is a 71 y.o. female admitted on Aug 03, 2016 with cardiac arrest.  Pharmacy has been consulted for Vancomycin/Zosyn dosing for HCAP vs Aspiration PNA. Scr continues to rise - up to 4.02 today, est normalized CrCl ~ 15 ml/min.   Vancomycin trough 44 mcg/ml (SUPRAtherapeutic) on 1750mg  IV q24h  Plan: -Hold vancomycin -Consider random vanc level in 24 hours -Continue Zosyn 3.375G IV q8h infused over 4 hours -Will f/u for LOT.   Temp (24hrs), Avg:98.5 F (36.9 C), Min:97.5 F (36.4 C), Max:99 F (37.2 C)   Recent Labs Lab 2016-08-03 2326  07/28/16 0500  07/29/16 1600 07/29/16 1830 07/29/16 1839 07/29/16 2222 07/30/16 0447 07/31/16 0435 07/28/2016 0430 07/30/2016 0432  WBC  --   --  32.7*  --  17.6*  --   --   --  18.0* 14.1* 22.3*  --   CREATININE  --   < > 1.78*  < >  --  2.26*  --  2.38* 2.55* 3.04* 4.02*  --   LATICACIDVEN 7.87*  --   --   --   --   --  1.58  --   --   --   --  3.7*  VANCOTROUGH  --   --   --   --   --   --   --   --   --   --  44*  --   < > = values in this interval not displayed.  Estimated Creatinine Clearance: 27.1 mL/min (by C-G formula based on SCr of 4.02 mg/dL (H)).    Allergies  Allergen Reactions  . Augmentin [Amoxicillin-Pot Clavulanate] Other (See Comments)    Abdominal pain   Antimicrobials this admission:  Vanc 2/3 >>  Zosyn 2/3>>   Microbiology results:  2/3 MRSA PCR: neg  Christoper Fabian, PharmD, BCPS Clinical pharmacist, pager [phone]  07/28/2016 5:53 AM

## 2016-08-23 NOTE — Progress Notes (Signed)
Patient asystole on monitor, no heart tones or lung sounds auscultated for 1 minute with 2 RN's-Edward Guthmiller & Exie Parody.  E-Link MD notified.  250 cc of Morphine wasted in sink. Emotional support offered to family. [place], Mitzi Hansen

## 2016-08-23 NOTE — Progress Notes (Signed)
eLink Physician-Brief Progress Note Patient Name: Nancy West DOB: 03-05-1946 MRN: [id number]   Date of Service  08/11/2016  HPI/Events of Note  Elevated LA 3.7 Still hypercarbic but maxed out on RR and TV ABG 7.1/55/75.5/90%  eICU Interventions  Start bicarb drip     Intervention Category Major Interventions: Acid-Base disturbance - evaluation and management  Matrice Herro 08/14/2016, 6:58 AM

## 2016-08-23 NOTE — Progress Notes (Signed)
100 ml of Fentayl wasted in sink witnessed by 2 RN's-Luismario Coston & [employer]. [place], Mitzi Hansen

## 2016-08-23 NOTE — Progress Notes (Signed)
MRI notified of patient's need for testing, will come up and assess patient to see if she will be able to fit into machine.  MD updated.  MD notified of patient's need for Flexiseal during the night. [place], Mitzi Hansen

## 2016-08-23 NOTE — Discharge Summary (Signed)
NAME:  Nancy West, Nancy West              ACCOUNT NO.:  [id number]  MEDICAL RECORD NO.:  [id number]  LOCATION:                                 FACILITY:  PHYSICIAN:  Felipa Evener, MD  DATE OF BIRTH:  June 02, 1946  DATE OF ADMISSION:  2016-08-09 DATE OF DISCHARGE:                              DISCHARGE SUMMARY   DEATH SUMMARY  PRIMARY DIAGNOSIS/CAUSE OF DEATH:  Pulseless electrical activity cardiac arrest.  SECONDARY DIAGNOSES:  Respiratory failure, anoxic brain injury, pulmonary infiltrate, respiratory acidosis, cardiogenic shock, cardiomyopathy, diastolic heart failure, acute kidney injury, hyperkalemia, aspiration pneumonia and seizure.  The patient is a 71 year old female with past medical history significant for cardiomyopathy with diastolic heart failure with ejection fraction of 40-45%, presented to the hospital after being found down at home with initial rhythm of ventricular fibrillation, defibrillator, become pulseless electrical activity cardiac arrest.  The patient was admitted to intensive care unit on 09-Aug-2016, and was supported until to August 01, 2016; however, showed evidence of severe anoxic brain injury with seizure activity that was un-abating.  At which point, we called for family meeting, __________, we discussed the plan of care and informed that the patient was DNR in the past and would not want this level of care.  At which point, the patient was started morphine, extubated, expired shortly thereafter.     Felipa Evener, MD   ______________________________ Felipa Evener, MD    WJY/MEDQ  D:  08/08/2016  T:  08/09/2016  Job:  [id number]

## 2016-08-23 NOTE — Progress Notes (Signed)
ANTICOAGULATION CONSULT NOTE Pharmacy Consult for Heparin  Indication: h/o Afib  Allergies  Allergen Reactions  . Augmentin [Amoxicillin-Pot Clavulanate] Other (See Comments)    Abdominal pain   Vital Signs: Temp: 98.2 F (36.8 C) (02/07 0000) Temp Source: Core (Comment) (02/07 0000) BP: 79/33 (02/07 0000) Pulse Rate: 106 (02/07 0000)  Labs:  Recent Labs  07/29/16 1600  07/29/16 2222 07/30/16 0447 07/31/16 0435 07/31/16 1400 07/26/2016 0120  HGB 9.8*  --   --  9.1* 8.7*  --   --   HCT 31.4*  --   --  29.6* 29.0*  --   --   PLT 130*  --   --  141* 124*  --   --   APTT 82*  < >  --  81* 58* 49*  --   HEPARINUNFRC 1.32*  --   --  0.86* 0.38 0.23* 0.35  CREATININE  --   < > 2.38* 2.55* 3.04*  --   --   < > = values in this interval not displayed.  Estimated Creatinine Clearance: 33.1 mL/min (by C-G formula based on SCr of 3.04 mg/dL (H)).   Assessment: 71 y/o female w/ hx afib s/p cardiac arrest, Xarelto on hold, for heparin   Goal of Therapy:  Heparin level 0.3-0.7 Monitor platelets by anticoagulation protocol: Yes   Plan:  Continue Heparin at current rate   Geannie Risen, PharmD, BCPS  08/08/2016 2:05 AM

## 2016-08-23 NOTE — Procedures (Signed)
Extubation Procedure Note  Patient Details:   Name: Nancy West DOB: [birth year] MRN: [id number]   Airway Documentation:     Evaluation  O2 sats: Terminal extubation Complications: Complications of Terminal extubation Patient did tolerate procedure well. Bilateral Breath Sounds: Clear, Diminished   No  Antoine Poche 08/11/16, 1510

## 2016-08-23 NOTE — Progress Notes (Signed)
   2016/08/26 1430  Clinical Encounter Type  Visited With Patient and family together  Visit Type Spiritual support  Spiritual Encounters  Spiritual Needs Ritual  Stress Factors  Patient Stress Factors Not reviewed  Family Stress Factors Family relationships  Introduction to family as Pt not conscious. Anointed Pt with "last Rites".

## 2016-08-23 NOTE — Progress Notes (Signed)
Called to bedside to talk with family.  They spoke with Dr. Molli Knock earlier today and have now spoken with neurology.  They understand that her prognosis is very poor.  They discussed at length with neurology and do not wish to see pt "suffer any longer", they do not wish to "delay the inevitable".  They have discussed with all pertinent family members and had a priest perform last rites.  They wish to proceed with withdrawal of life support and transition to full comfort care.  Process discussed at length, all questions answered, support given. They are very appreciative of the care she has received and feel that they are making the exact decisions she would be making for herself if she were able.     Will add morphine gtt for comfort, continue low dose propofol given extensive myoclonus, PRN versed.   All labs, xrays, other medications discontinued.  Withdrawal orders entered.  Discussed with RN and RT.   Additional cc time 20 mins   Dirk Dress, NP 08/21/2016  4:19 PM Pager: [phone] or [phone]

## 2016-08-23 NOTE — Progress Notes (Signed)
Subjective: Interval History:  No acute events overnight. Pt starts to have generalized myoclonus 15-20 min after holding propofol.  Objective: Vital signs in last 24 hours: Temp:  [97.5 F (36.4 C)-99 F (37.2 C)] 98.8 F (37.1 C) (02/07 1100) Pulse Rate:  [82-110] 82 (02/07 1300) Resp:  [0-40] 28 (02/07 1300) BP: (65-110)/(27-77) 89/56 (02/07 0700) SpO2:  [87 %-100 %] 90 % (02/07 1300) Arterial Line BP: (74-270)/(28-236) 109/42 (02/07 1300) FiO2 (%):  [70 %-80 %] 70 % (02/07 1121) Weight:  [224.1 kg (494 lb)] 224.1 kg (494 lb) (02/07 0432)  Intake/Output from previous day: 02/06 0701 - 02/07 0700 In: 3122.3 [I.V.:1732.3; NG/GT:865; IV Piggyback:525] Out: 6789 [Urine:680; Stool:550] Intake/Output this shift: Total I/O In: 1276.4 [I.V.:966.4; NG/GT:160; IV Piggyback:150] Out: 15 [Urine:15] Nutritional status:    Physical Exam Intubated and off sedation Pupils approximately 3 mm and poorly reactive to light. Corneals absent bilaterally. Gag absent. She is breathing over the ventilator. occulocephalic absent. No withdrawal to noxious stimulation was noted. Generalized myoclonus was appreciated today after holding propofol for 15 min.  Lab Results:  Recent Labs  07/31/16 0435 August 11, 2016 0430  WBC 14.1* 22.3*  HGB 8.7* 10.0*  HCT 29.0* 34.1*  PLT 124* 155  NA 136 135  K 4.8 5.7*  CL 100* 98*  CO2 21* 17*  GLUCOSE 139* 110*  BUN 53* 70*  CREATININE 3.04* 4.02*  CALCIUM 7.9* 8.0*   Lipid Panel No results for input(s): CHOL, TRIG, HDL, CHOLHDL, VLDL, LDLCALC in the last 72 hours.  Studies/Results: Dg Chest Port 1 View  Result Date: Aug 11, 2016 CLINICAL DATA:  Respiratory failure. EXAM: PORTABLE CHEST 1 VIEW COMPARISON:  07/31/2016 FINDINGS: Endotracheal tube and NG tube remain in good position. Left central venous catheter tip is in the superior vena cava. Hazy bilateral pulmonary infiltrates have improved bilaterally. IMPRESSION: Improved bilateral pulmonary  infiltrates. Electronically Signed   By: Lorriane Shire M.D.   On: August 11, 2016 07:15   Dg Chest Port 1 View  Result Date: 07/31/2016 CLINICAL DATA:  71 year old female with respiratory failure. Subsequent encounter. EXAM: PORTABLE CHEST 1 VIEW COMPARISON:  07/30/2016. FINDINGS: Endotracheal tube tip 3.1 cm above the carina. Gastric tube seen to level of distal esophagus and not further distally secondary to technique. Multiple overlying lines. Cardiomegaly. Diffuse asymmetric airspace disease greater on the right suggestive of pulmonary edema minimally changed since prior exam. Infectious infiltrate not excluded in proper clinical setting. Lucency right perihilar region may represent overlapping structures without other findings of pneumothorax. Attention to this on follow-up. Calcified tortuous aorta. IMPRESSION: Cardiomegaly. Diffuse asymmetric airspace disease greater on the right suggestive of pulmonary edema minimally changed since prior exam. Lucency right perihilar region may represent overlapping structures without other findings of pneumothorax. Attention to this on follow-up. Electronically Signed   By: Genia Del M.D.   On: 07/31/2016 07:22    Medications:  Scheduled: . albuterol  2.5 mg Nebulization Q6H  . chlorhexidine gluconate (MEDLINE KIT)  15 mL Mouth Rinse BID  . Chlorhexidine Gluconate Cloth  6 each Topical Daily  . feeding supplement (PRO-STAT SUGAR FREE 64)  60 mL Per Tube 5 X Daily  . feeding supplement (VITAL HIGH PROTEIN)  1,000 mL Per Tube Q24H  . fentaNYL (SUBLIMAZE) injection  50 mcg Intravenous Once  . insulin aspart  0-20 Units Subcutaneous Q4H  . insulin glargine  5 Units Subcutaneous QHS  . levETIRAcetam  1,000 mg Intravenous Q12H  . mouth rinse  15 mL Mouth Rinse 10 times per day  .  pantoprazole (PROTONIX) IV  40 mg Intravenous QHS  . piperacillin-tazobactam (ZOSYN)  IV  3.375 g Intravenous Q8H  . sodium chloride flush  10-40 mL Intracatheter Q12H  . valproate  sodium  500 mg Intravenous Q12H   Continuous: . sodium chloride 10 mL/hr at August 18, 2016 0700  . fentaNYL infusion INTRAVENOUS Stopped (07/31/16 0800)  . heparin 1,550 Units/hr (2016-08-18 0928)  . norepinephrine (LEVOPHED) Adult infusion 50 mcg/min (18-Aug-2016 1038)  . propofol (DIPRIVAN) infusion 30 mcg/kg/min (2016/08/18 1146)  .  sodium bicarbonate (isotonic) infusion in sterile water 125 mL/hr at 2016/08/18 0929  . vasopressin (PITRESSIN) infusion - *FOR SHOCK* Stopped (07/30/16 1640)   RBH:[id number], fentaNYL (SUBLIMAZE) injection, midazolam, sodium chloride flush  Assessment/Plan: 71 years old female with multiple medical problems and cardiac arrest day 3 after rewarmed. There has been no change in her clinical examination. She has myoclonus when sedation is held and exam remains extremely poor. I think based on her clinical examination and generalized discharges noted on EEG her prognosis to a meaningful neurological recovery appears to be small. I discussed this with the family in details. I have told them that its difficult to prognosticate because of inability to get MRI of Brain. CT head was discussed however husband reported that she would not want to be living in this situation. I think her best prognosis if she survives will be trach and peg. Husband stated that she would not want any of these measures.  Palliative care consultation is recommended.  Neurology will sign off. Please call with questions.   LOS: 4 days   Rochele Raring

## 2016-08-23 NOTE — Progress Notes (Addendum)
eLink Physician-Brief Progress Note Patient Name: Nancy West DOB: [birth year] MRN: [id number]   Date of Service  07/27/2016  HPI/Events of Note  ABG 7.09/60/84/93% Worsening metabolic and resp acidosis Suspect worsening ARDS  eICU Interventions  Increase RR from 32 to 36 TV is at 8cc/hg. Cannot increase it as plateau pressure is already 31 Check CXR, labs, lactic acid Follow ABG. Will need bicarb if still acidotic     Intervention Category Major Interventions: Acid-Base disturbance - evaluation and management  Gabrial Domine 08/13/2016, 3:20 AM

## 2016-08-23 DEATH — deceased

## 2016-09-04 ENCOUNTER — Ambulatory Visit: Payer: Medicare Other | Admitting: Pulmonary Disease

## 2016-11-23 NOTE — Addendum Note (Signed)
Addendum  created 11/23/16 0949 by Sanita Estrada, MD   Sign clinical note    

## 2017-06-07 IMAGING — CR DG CHEST 1V PORT
1 series · 1 of 1 positions shown · non-contrast
Comparison: 07/29/2016 at 1209 hours

CLINICAL DATA: Hypoxia.

EXAM:
PORTABLE CHEST 1 VIEW

[AP]
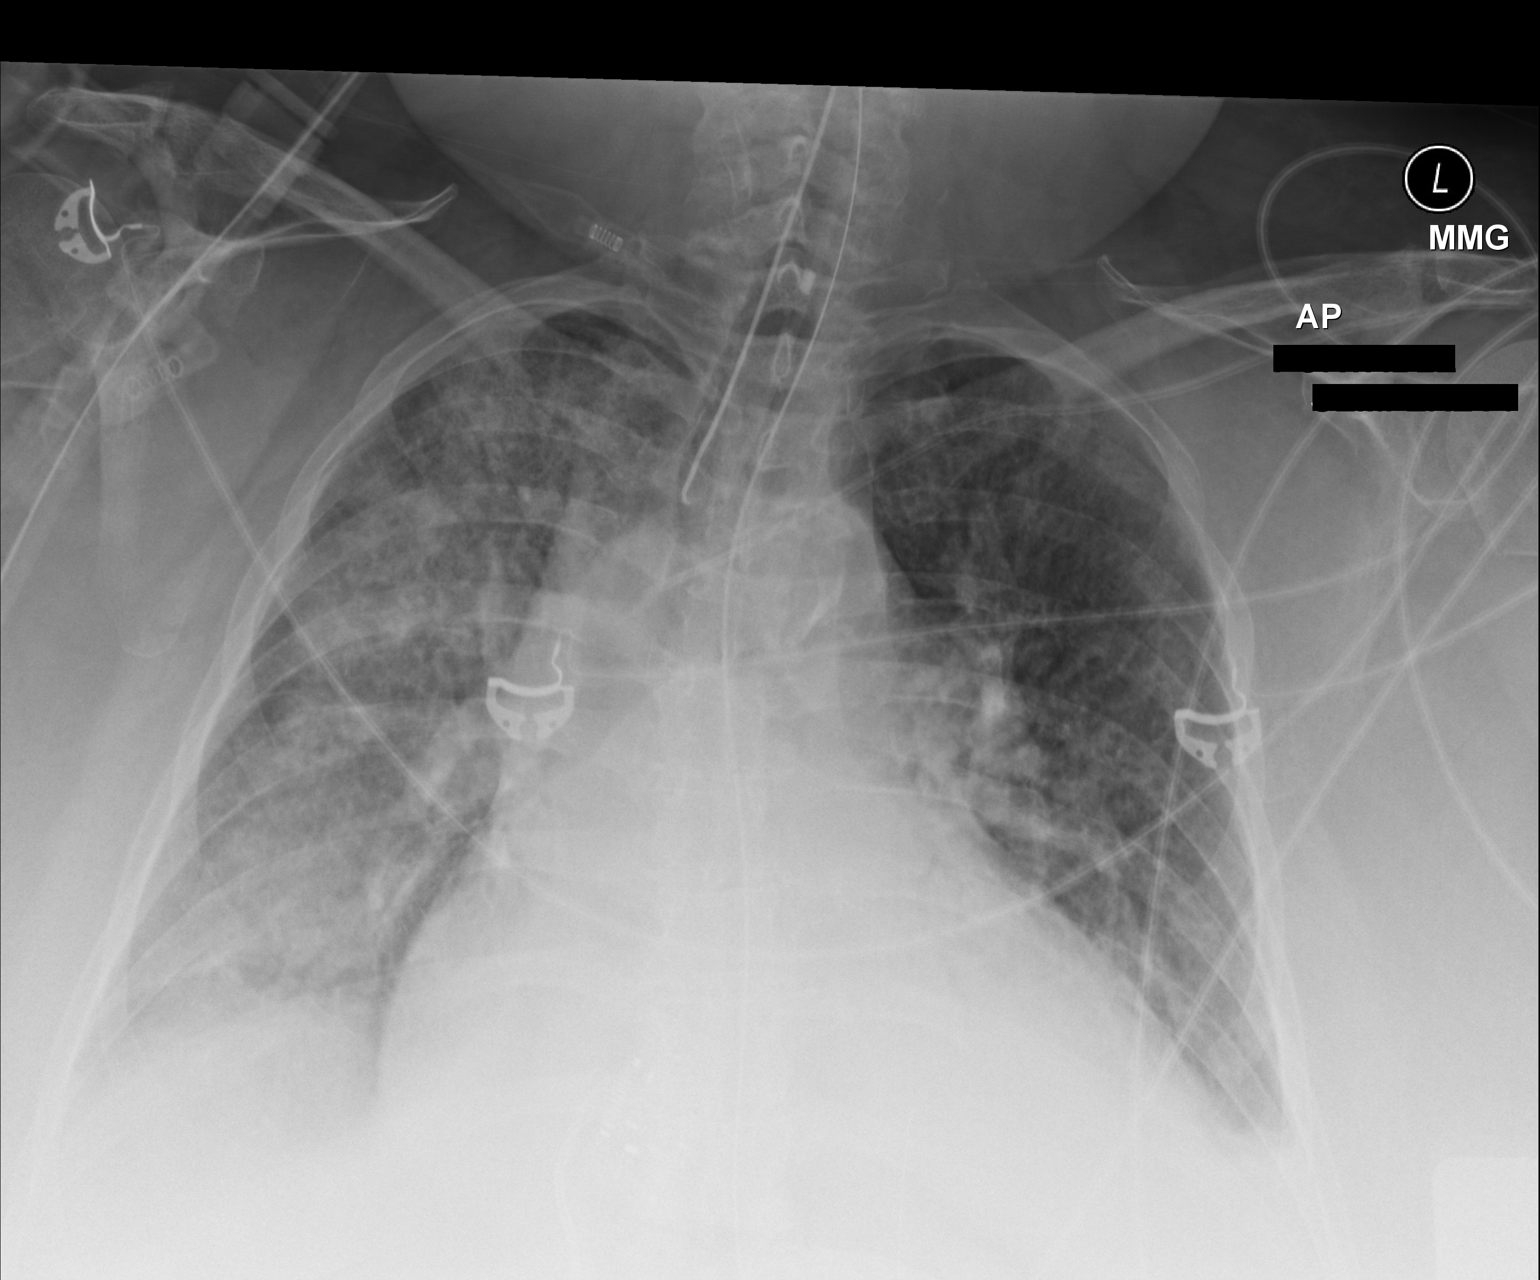

[1 of 1 positions shown; findings below may reference images not displayed]

FINDINGS: Endotracheal tube with tip measuring 3.9 cm above the carina.
Enteric tube tip is off the field of view but below the left
hemidiaphragm. A mediastinal drain is in place. Left central venous
catheter with tip projected horizontally in the right mediastinum,
likely near the junction of the brachiocephalic vein and SVC.
Persistent airspace disease demonstrated throughout the right lung
suggesting asymmetric edema or pneumonia. Infiltration in the left
perihilar region. Probable small bilateral pleural effusions.
Cardiac enlargement. No definite pulmonary vascular congestion.
Calcification of the aorta.
IMPRESSION: Appliances are unchanged in position since prior study. Cardiac
enlargement. Bilateral perihilar in diffuse right lung infiltrates
could represent edema or pneumonia. Small bilateral pleural
effusions.

## 2017-06-08 IMAGING — CR DG CHEST 1V PORT
1 series · 1 of 1 positions shown · non-contrast
Comparison: Portable chest x-ray July 29, 2016

CLINICAL DATA: Aspiration pneumonia, acute respiratory failure,
cardiac arrest.

EXAM:
PORTABLE CHEST 1 VIEW

[AP]
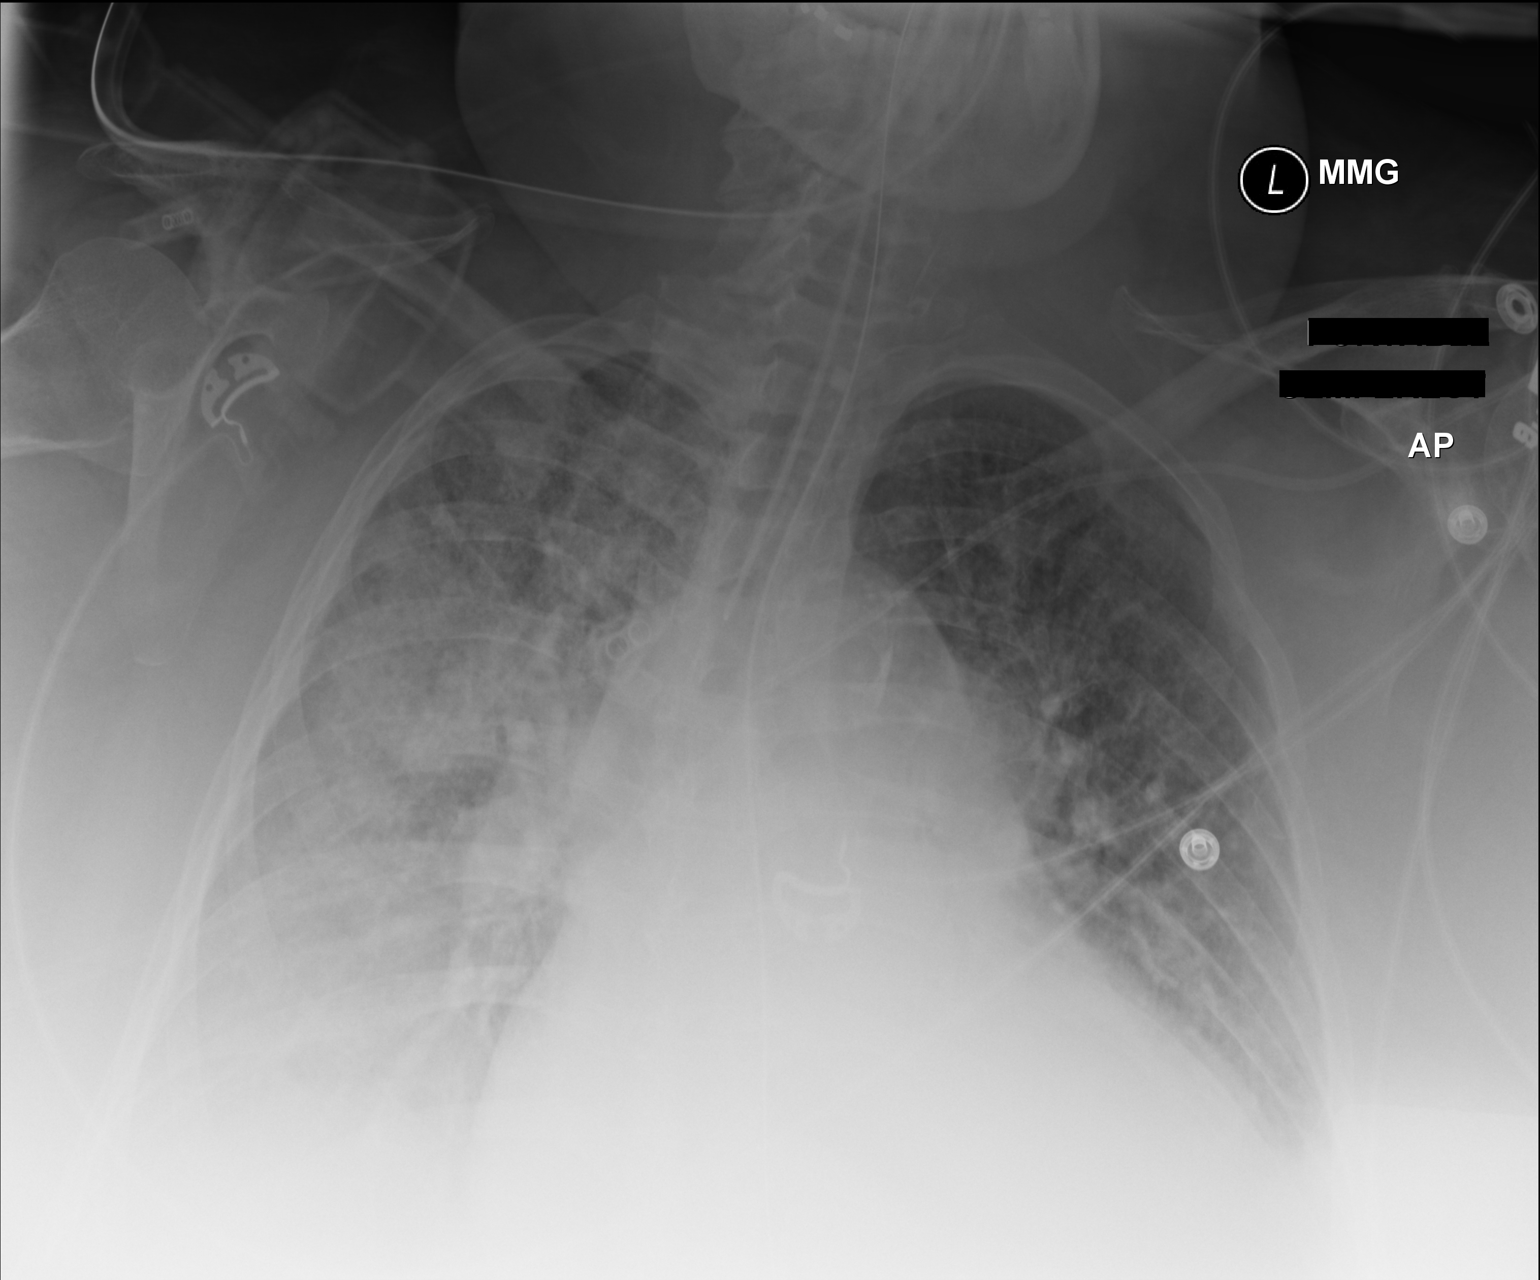

[1 of 1 positions shown; findings below may reference images not displayed]

FINDINGS: The lungs are well-expanded. The pulmonary interstitial and alveolar
opacities have become more conspicuous especially on the right. The
hemidiaphragms are obscured. The cardiac silhouette remains
enlarged. The pulmonary vascularity is more engorged. The
endotracheal tube tip lies 2.9 cm above the carina. The
esophagogastric tube tip projects below the inferior margin of the
image. The left subclavian venous catheter tip projects at the
junction of the right and left brachiocephalic veins.
IMPRESSION: Interval worsening of pulmonary interstitial and alveolar opacities
most compatible with CHF. Underlying pneumonia is not excluded.

## 2017-06-10 IMAGING — CR DG CHEST 1V PORT
1 series · 1 of 1 positions shown · non-contrast
Comparison: 07/31/2016

CLINICAL DATA: Respiratory failure.

EXAM:
PORTABLE CHEST 1 VIEW

[AP]
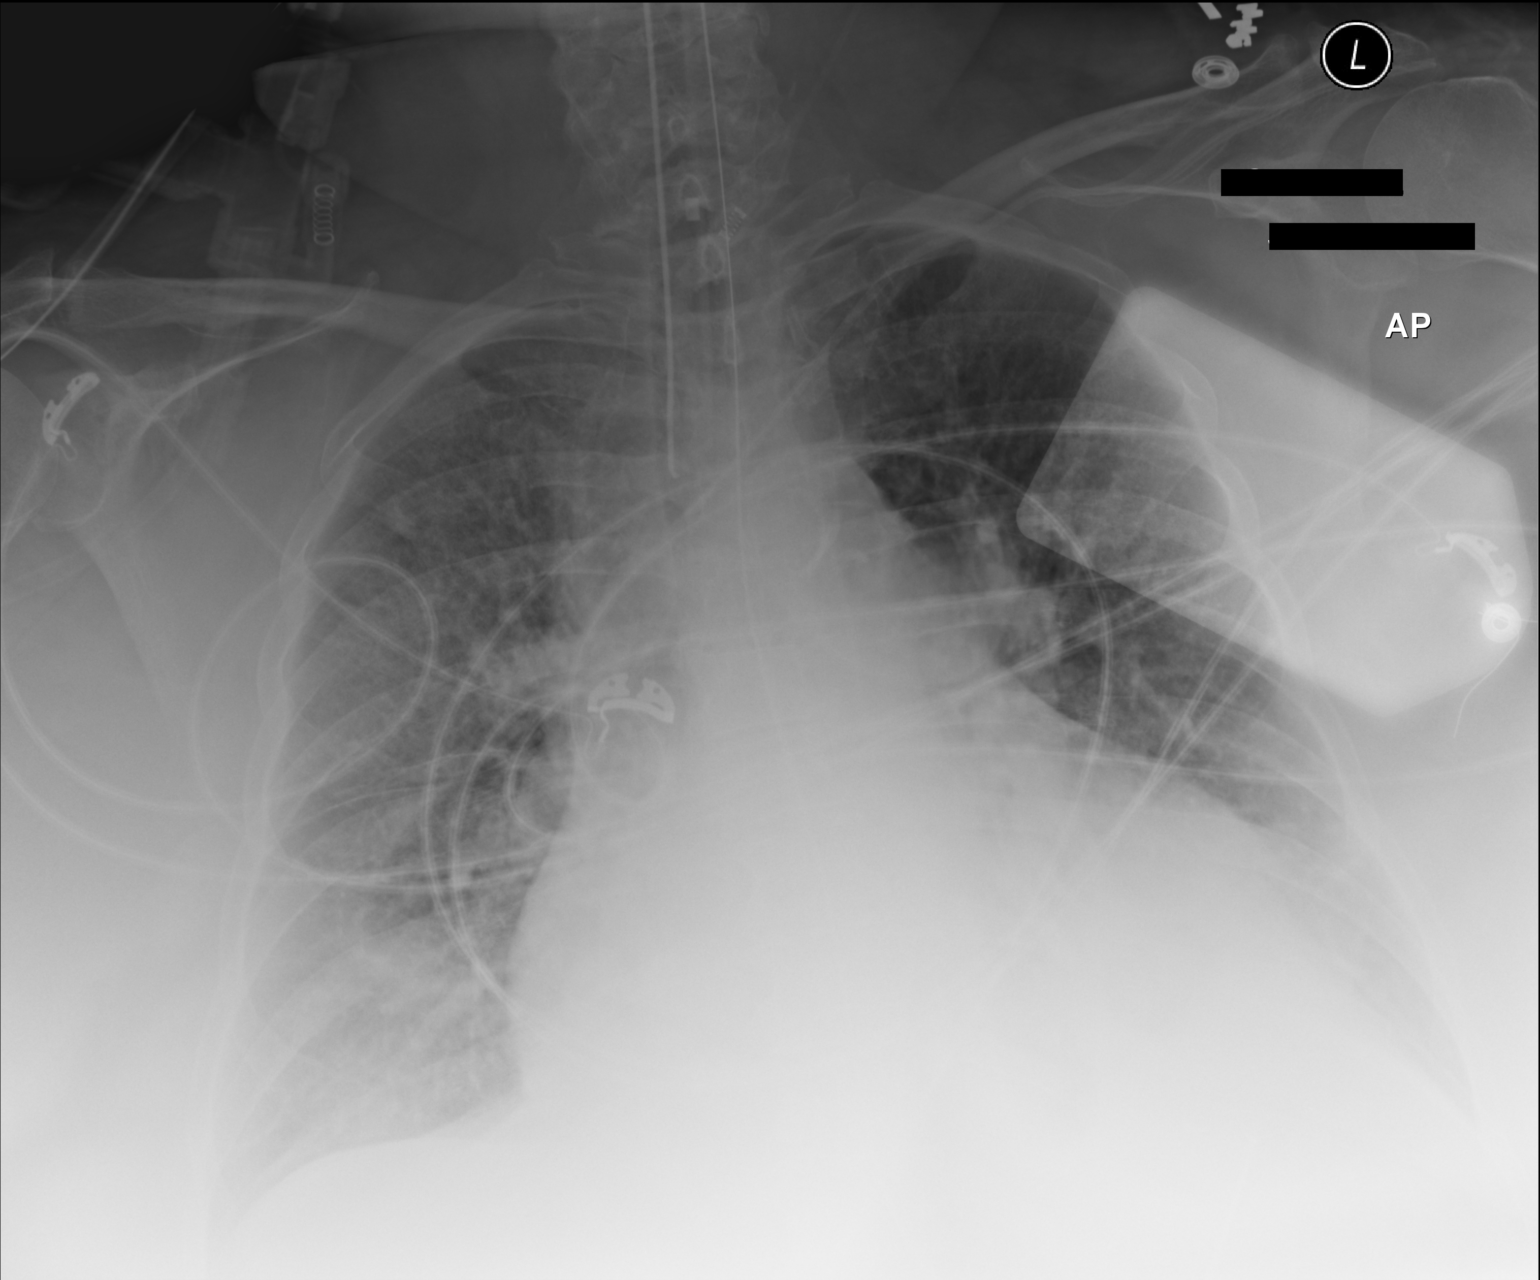

[1 of 1 positions shown; findings below may reference images not displayed]

FINDINGS: Endotracheal tube and NG tube remain in good position. Left central
venous catheter tip is in the superior vena cava.

Hazy bilateral pulmonary infiltrates have improved bilaterally.
IMPRESSION: Improved bilateral pulmonary infiltrates.
